# Patient Record
Sex: Male | Born: 1935 | Race: White | Hispanic: No | Smoking: Former smoker
Health system: Southern US, Community
[De-identification: ages and names within clinical notes are randomized; demographics above are authoritative.]

## PROBLEM LIST (undated history)

## (undated) DIAGNOSIS — E785 Hyperlipidemia, unspecified: Secondary | ICD-10-CM

## (undated) DIAGNOSIS — N138 Other obstructive and reflux uropathy: Secondary | ICD-10-CM

## (undated) DIAGNOSIS — I25810 Atherosclerosis of coronary artery bypass graft(s) without angina pectoris: Secondary | ICD-10-CM

## (undated) DIAGNOSIS — J189 Pneumonia, unspecified organism: Secondary | ICD-10-CM

## (undated) DIAGNOSIS — N401 Enlarged prostate with lower urinary tract symptoms: Secondary | ICD-10-CM

## (undated) DIAGNOSIS — I739 Peripheral vascular disease, unspecified: Secondary | ICD-10-CM

## (undated) DIAGNOSIS — K219 Gastro-esophageal reflux disease without esophagitis: Secondary | ICD-10-CM

## (undated) DIAGNOSIS — I779 Disorder of arteries and arterioles, unspecified: Secondary | ICD-10-CM

## (undated) DIAGNOSIS — I10 Essential (primary) hypertension: Secondary | ICD-10-CM

## (undated) DIAGNOSIS — L111 Transient acantholytic dermatosis [Grover]: Secondary | ICD-10-CM

## (undated) HISTORY — DX: Disorder of arteries and arterioles, unspecified: I77.9

## (undated) HISTORY — DX: Transient acantholytic dermatosis (grover): L11.1

## (undated) HISTORY — DX: Benign prostatic hyperplasia with lower urinary tract symptoms: N40.1

## (undated) HISTORY — DX: Gastro-esophageal reflux disease without esophagitis: K21.9

## (undated) HISTORY — DX: Essential (primary) hypertension: I10

## (undated) HISTORY — PX: POLYPECTOMY: SHX149

## (undated) HISTORY — DX: Hyperlipidemia, unspecified: E78.5

## (undated) HISTORY — PX: COLONOSCOPY: SHX174

## (undated) HISTORY — DX: Atherosclerosis of coronary artery bypass graft(s) without angina pectoris: I25.810

## (undated) HISTORY — DX: Pneumonia, unspecified organism: J18.9

## (undated) HISTORY — DX: Other obstructive and reflux uropathy: N13.8

## (undated) HISTORY — PX: OTHER SURGICAL HISTORY: SHX169

## (undated) HISTORY — DX: Peripheral vascular disease, unspecified: I73.9

---

## 1992-08-07 ENCOUNTER — Encounter: Payer: Self-pay | Admitting: Cardiology

## 1993-10-18 HISTORY — PX: CORONARY ARTERY BYPASS GRAFT: SHX141

## 2002-06-24 ENCOUNTER — Inpatient Hospital Stay (HOSPITAL_COMMUNITY): Admission: EM | Admit: 2002-06-24 | Discharge: 2002-06-25 | Payer: Self-pay | Admitting: Emergency Medicine

## 2002-06-25 ENCOUNTER — Encounter: Payer: Self-pay | Admitting: Internal Medicine

## 2002-10-18 HISTORY — PX: OTHER SURGICAL HISTORY: SHX169

## 2003-01-08 ENCOUNTER — Encounter: Payer: Self-pay | Admitting: Internal Medicine

## 2003-01-08 ENCOUNTER — Inpatient Hospital Stay (HOSPITAL_COMMUNITY): Admission: EM | Admit: 2003-01-08 | Discharge: 2003-01-11 | Payer: Self-pay | Admitting: Internal Medicine

## 2003-01-09 ENCOUNTER — Encounter: Payer: Self-pay | Admitting: Internal Medicine

## 2003-01-10 ENCOUNTER — Encounter: Payer: Self-pay | Admitting: Internal Medicine

## 2004-09-04 ENCOUNTER — Ambulatory Visit: Payer: Self-pay | Admitting: Internal Medicine

## 2004-12-09 ENCOUNTER — Ambulatory Visit: Payer: Self-pay | Admitting: Cardiology

## 2005-09-08 ENCOUNTER — Ambulatory Visit: Payer: Self-pay | Admitting: Internal Medicine

## 2005-12-23 ENCOUNTER — Ambulatory Visit: Payer: Self-pay | Admitting: Cardiology

## 2005-12-29 ENCOUNTER — Ambulatory Visit: Payer: Self-pay | Admitting: Cardiology

## 2006-01-17 ENCOUNTER — Ambulatory Visit: Payer: Self-pay

## 2006-08-11 ENCOUNTER — Ambulatory Visit: Payer: Self-pay | Admitting: Internal Medicine

## 2006-12-19 ENCOUNTER — Ambulatory Visit: Payer: Self-pay | Admitting: Cardiology

## 2006-12-19 LAB — CONVERTED CEMR LAB
ALT: 35 units/L (ref 0–40)
AST: 33 units/L (ref 0–37)
Albumin: 3.7 g/dL (ref 3.5–5.2)
Alkaline Phosphatase: 47 units/L (ref 39–117)
BUN: 15 mg/dL (ref 6–23)
Bilirubin, Direct: 0.2 mg/dL (ref 0.0–0.3)
CO2: 30 meq/L (ref 19–32)
Calcium: 8.9 mg/dL (ref 8.4–10.5)
Chloride: 104 meq/L (ref 96–112)
Cholesterol: 121 mg/dL (ref 0–200)
Creatinine, Ser: 1.3 mg/dL (ref 0.4–1.5)
GFR calc Af Amer: 70 mL/min
GFR calc non Af Amer: 58 mL/min
Glucose, Bld: 96 mg/dL (ref 70–99)
HDL: 48.9 mg/dL (ref >39.0)
LDL Cholesterol: 56 mg/dL (ref 0–99)
Potassium: 3.9 meq/L (ref 3.5–5.1)
Sodium: 139 meq/L (ref 135–145)
Total Bilirubin: 1.1 mg/dL (ref 0.3–1.2)
Total CHOL/HDL Ratio: 2.5
Total Protein: 6.6 g/dL (ref 6.0–8.3)
Triglycerides: 80 mg/dL (ref 0–149)
VLDL: 16 mg/dL (ref 0–40)

## 2007-05-03 ENCOUNTER — Ambulatory Visit: Payer: Self-pay

## 2007-05-03 ENCOUNTER — Encounter: Payer: Self-pay | Admitting: Internal Medicine

## 2007-09-07 ENCOUNTER — Ambulatory Visit: Payer: Self-pay | Admitting: Internal Medicine

## 2007-10-02 ENCOUNTER — Ambulatory Visit: Payer: Self-pay | Admitting: Cardiology

## 2007-10-02 LAB — CONVERTED CEMR LAB
ALT: 33 units/L (ref 0–53)
AST: 30 units/L (ref 0–37)
Albumin: 3.6 g/dL (ref 3.5–5.2)
Alkaline Phosphatase: 39 units/L (ref 39–117)
Bilirubin, Direct: 0.2 mg/dL (ref 0.0–0.3)
Cholesterol: 119 mg/dL (ref 0–200)
HDL: 44.6 mg/dL (ref >39.0)
LDL Cholesterol: 61 mg/dL (ref 0–99)
Total Bilirubin: 1.3 mg/dL — ABNORMAL HIGH (ref 0.3–1.2)
Total CHOL/HDL Ratio: 2.7
Total Protein: 6.8 g/dL (ref 6.0–8.3)
Triglycerides: 66 mg/dL (ref 0–149)
VLDL: 13 mg/dL (ref 0–40)

## 2007-11-10 ENCOUNTER — Ambulatory Visit: Payer: Self-pay | Admitting: Internal Medicine

## 2008-01-01 ENCOUNTER — Ambulatory Visit: Payer: Self-pay | Admitting: Cardiology

## 2008-03-01 ENCOUNTER — Ambulatory Visit: Payer: Self-pay | Admitting: Internal Medicine

## 2008-05-17 ENCOUNTER — Ambulatory Visit: Payer: Self-pay

## 2008-07-24 ENCOUNTER — Ambulatory Visit: Payer: Self-pay | Admitting: Internal Medicine

## 2008-12-25 ENCOUNTER — Encounter: Payer: Self-pay | Admitting: Cardiology

## 2008-12-25 ENCOUNTER — Ambulatory Visit: Payer: Self-pay | Admitting: Cardiology

## 2009-03-03 ENCOUNTER — Encounter (INDEPENDENT_AMBULATORY_CARE_PROVIDER_SITE_OTHER): Payer: Self-pay | Admitting: *Deleted

## 2009-04-17 ENCOUNTER — Telehealth (INDEPENDENT_AMBULATORY_CARE_PROVIDER_SITE_OTHER): Payer: Self-pay | Admitting: *Deleted

## 2009-04-22 ENCOUNTER — Ambulatory Visit: Payer: Self-pay

## 2009-04-22 ENCOUNTER — Ambulatory Visit: Payer: Self-pay | Admitting: Internal Medicine

## 2009-04-22 ENCOUNTER — Encounter: Payer: Self-pay | Admitting: Cardiology

## 2009-04-22 DIAGNOSIS — N401 Enlarged prostate with lower urinary tract symptoms: Secondary | ICD-10-CM

## 2009-04-22 DIAGNOSIS — L259 Unspecified contact dermatitis, unspecified cause: Secondary | ICD-10-CM | POA: Insufficient documentation

## 2009-04-22 LAB — CONVERTED CEMR LAB
Blood in Urine, dipstick: NEGATIVE
Nitrite: NEGATIVE
Protein, U semiquant: NEGATIVE
WBC Urine, dipstick: NEGATIVE

## 2009-04-23 ENCOUNTER — Telehealth (INDEPENDENT_AMBULATORY_CARE_PROVIDER_SITE_OTHER): Payer: Self-pay | Admitting: *Deleted

## 2009-04-24 ENCOUNTER — Ambulatory Visit: Payer: Self-pay | Admitting: Internal Medicine

## 2009-04-24 DIAGNOSIS — E7800 Pure hypercholesterolemia, unspecified: Secondary | ICD-10-CM | POA: Insufficient documentation

## 2009-04-24 DIAGNOSIS — E785 Hyperlipidemia, unspecified: Secondary | ICD-10-CM

## 2009-04-29 ENCOUNTER — Ambulatory Visit: Payer: Self-pay | Admitting: Cardiology

## 2009-04-29 ENCOUNTER — Encounter (INDEPENDENT_AMBULATORY_CARE_PROVIDER_SITE_OTHER): Payer: Self-pay | Admitting: *Deleted

## 2009-04-29 ENCOUNTER — Encounter: Payer: Self-pay | Admitting: Cardiology

## 2009-04-29 DIAGNOSIS — I25810 Atherosclerosis of coronary artery bypass graft(s) without angina pectoris: Secondary | ICD-10-CM | POA: Insufficient documentation

## 2009-04-29 DIAGNOSIS — R9439 Abnormal result of other cardiovascular function study: Secondary | ICD-10-CM | POA: Insufficient documentation

## 2009-04-29 DIAGNOSIS — I10 Essential (primary) hypertension: Secondary | ICD-10-CM | POA: Insufficient documentation

## 2009-04-30 ENCOUNTER — Encounter: Payer: Self-pay | Admitting: Cardiology

## 2009-04-30 LAB — CONVERTED CEMR LAB
Basophils Relative: 0.7 % (ref 0.0–3.0)
Chloride: 104 meq/L (ref 96–112)
Creatinine, Ser: 1.1 mg/dL (ref 0.4–1.5)
Eosinophils Relative: 4.6 % (ref 0.0–5.0)
INR: 1 (ref 0.8–1.0)
MCV: 92.1 fL (ref 78.0–100.0)
Monocytes Absolute: 0.9 10*3/uL (ref 0.1–1.0)
Neutrophils Relative %: 62.4 % (ref 43.0–77.0)
Potassium: 4.1 meq/L (ref 3.5–5.1)
RBC: 4.49 M/uL (ref 4.22–5.81)
WBC: 8.7 10*3/uL (ref 4.5–10.5)

## 2009-05-01 ENCOUNTER — Inpatient Hospital Stay (HOSPITAL_BASED_OUTPATIENT_CLINIC_OR_DEPARTMENT_OTHER): Admission: RE | Admit: 2009-05-01 | Discharge: 2009-05-01 | Payer: Self-pay | Admitting: Cardiology

## 2009-05-01 ENCOUNTER — Ambulatory Visit: Payer: Self-pay | Admitting: Cardiology

## 2009-05-04 LAB — CONVERTED CEMR LAB
Albumin: 3.9 g/dL (ref 3.5–5.2)
HDL: 50.1 mg/dL (ref >39.00)
Hgb A1c MFr Bld: 6 % (ref 4.6–6.5)
LDL Cholesterol: 64 mg/dL (ref 0–99)
PSA: 0.43 ng/mL (ref 0.10–4.00)
Total CHOL/HDL Ratio: 3
VLDL: 13.2 mg/dL (ref 0.0–40.0)

## 2009-05-05 ENCOUNTER — Telehealth (INDEPENDENT_AMBULATORY_CARE_PROVIDER_SITE_OTHER): Payer: Self-pay | Admitting: *Deleted

## 2009-05-06 ENCOUNTER — Encounter (INDEPENDENT_AMBULATORY_CARE_PROVIDER_SITE_OTHER): Payer: Self-pay | Admitting: *Deleted

## 2009-05-15 ENCOUNTER — Ambulatory Visit: Payer: Self-pay | Admitting: Cardiology

## 2009-07-22 ENCOUNTER — Telehealth: Payer: Self-pay | Admitting: Cardiology

## 2009-07-23 ENCOUNTER — Ambulatory Visit: Payer: Self-pay | Admitting: Internal Medicine

## 2009-08-19 ENCOUNTER — Ambulatory Visit: Payer: Self-pay | Admitting: Cardiology

## 2009-09-04 ENCOUNTER — Telehealth: Payer: Self-pay | Admitting: Cardiology

## 2009-09-26 ENCOUNTER — Telehealth: Payer: Self-pay | Admitting: Cardiology

## 2009-10-06 ENCOUNTER — Telehealth: Payer: Self-pay | Admitting: Cardiology

## 2009-10-24 ENCOUNTER — Ambulatory Visit: Payer: Self-pay | Admitting: Internal Medicine

## 2009-10-27 LAB — CONVERTED CEMR LAB: Hgb A1c MFr Bld: 6.2 % (ref 4.6–6.5)

## 2009-11-03 ENCOUNTER — Telehealth: Payer: Self-pay | Admitting: Cardiology

## 2009-11-04 ENCOUNTER — Telehealth: Payer: Self-pay | Admitting: Cardiology

## 2009-11-10 ENCOUNTER — Telehealth: Payer: Self-pay | Admitting: Cardiology

## 2009-11-10 ENCOUNTER — Ambulatory Visit: Payer: Self-pay | Admitting: Internal Medicine

## 2009-11-10 DIAGNOSIS — R7309 Other abnormal glucose: Secondary | ICD-10-CM

## 2009-11-10 DIAGNOSIS — R7303 Prediabetes: Secondary | ICD-10-CM | POA: Insufficient documentation

## 2010-02-16 ENCOUNTER — Ambulatory Visit: Payer: Self-pay | Admitting: Cardiology

## 2010-04-30 ENCOUNTER — Telehealth (INDEPENDENT_AMBULATORY_CARE_PROVIDER_SITE_OTHER): Payer: Self-pay | Admitting: *Deleted

## 2010-06-01 ENCOUNTER — Ambulatory Visit: Payer: Self-pay | Admitting: Internal Medicine

## 2010-06-01 DIAGNOSIS — I8000 Phlebitis and thrombophlebitis of superficial vessels of unspecified lower extremity: Secondary | ICD-10-CM

## 2010-06-02 ENCOUNTER — Encounter: Payer: Self-pay | Admitting: Internal Medicine

## 2010-06-02 ENCOUNTER — Ambulatory Visit: Payer: Self-pay

## 2010-06-08 ENCOUNTER — Ambulatory Visit: Payer: Self-pay | Admitting: Internal Medicine

## 2010-06-09 LAB — CONVERTED CEMR LAB
Albumin: 4 g/dL (ref 3.5–5.2)
Alkaline Phosphatase: 40 units/L (ref 39–117)
BUN: 17 mg/dL (ref 6–23)
Cholesterol: 143 mg/dL (ref 0–200)
Creatinine, Ser: 1.1 mg/dL (ref 0.4–1.5)
HDL: 43.9 mg/dL (ref >39.00)
Potassium: 4.8 meq/L (ref 3.5–5.1)
Triglycerides: 62 mg/dL (ref 0.0–149.0)
VLDL: 12.4 mg/dL (ref 0.0–40.0)

## 2010-06-15 ENCOUNTER — Telehealth (INDEPENDENT_AMBULATORY_CARE_PROVIDER_SITE_OTHER): Payer: Self-pay | Admitting: *Deleted

## 2010-08-07 ENCOUNTER — Ambulatory Visit: Payer: Self-pay | Admitting: Internal Medicine

## 2010-09-08 ENCOUNTER — Telehealth: Payer: Self-pay | Admitting: Cardiology

## 2010-11-19 NOTE — Miscellaneous (Signed)
Summary: Orders Update  Clinical Lists Changes  Orders: Added new Test order of Venous Duplex Lower Extremity (Venous Duplex Lower) - Signed 

## 2010-11-19 NOTE — Progress Notes (Signed)
Summary: B/P READINGS FOR THREE WEEKS   Phone Note Call from Patient Call back at Home Phone [phone] Call back at Work Phone [phone]   Caller: Patient Summary of Call: b/p readings 10/15/09 to 11/03/09  120/80 and had 14 in the 130's and 14 in the 140's and 6 in the 150's and 2 in the 160"s took b/p two to three times a day Initial call taken by: Judie Grieve,  November 03, 2009 4:03 PM  Follow-up for Phone Call        Continue current meds. If average is 140 no changes.  Follow-up by: Gaylord Shih, MD, [facility],  November 04, 2009 5:48 PM

## 2010-11-19 NOTE — Assessment & Plan Note (Signed)
Summary: See me before refilling meds/kdc   Vital Signs:  Patient profile:   77 year old male Weight:      231 pounds Pulse rate:   72 / minute Resp:     17 per minute BP sitting:   130 / 70  (left arm) Cuff size:   large  Vitals Entered By: Shonna Chock (November 10, 2009 9:35 AM) CC: Follow-up visit: Discuss labs Comments REVIEWED MED LIST, PATIENT AGREED DOSE AND INSTRUCTION CORRECT    Primary Care Provider:  Dr. Alwyn Ren  CC:  Follow-up visit: Discuss labs.  History of Present Illness: Lab reviewed & risk discussed; A1c up from 6 % to 6.2%. No CVE or diet. Weight stable; not checking glucoses @ home.  Allergies: 1)  ! Sulfa  Review of Systems General:  Denies weight loss. Eyes:  Denies blurring, double vision, and vision loss-both eyes; Ophth exam < 12 months; no retinopathy. CV:  Complains of lightheadness; denies chest pain or discomfort, leg cramps with exertion, near fainting, swelling of feet, swelling of hands, and weight gain. MS:  Complains of muscle aches; Occa nocturnal cramps. Derm:  Denies poor wound healing. Neuro:  Denies numbness and tingling; No limb burning. Endo:  Denies excessive hunger, excessive thirst, and excessive urination.  Physical Exam  General:  well-nourished,in no acute distress; alert,appropriate and cooperative throughout examination Lungs:  Normal respiratory effort, chest expands symmetrically. Lungs are clear to auscultation, no crackles or wheezes. Heart:  normal rate, regular rhythm, no gallop, no rub, no JVD, and grade 1/2 /6 systolic murmur.   Pulses:  R and L carotid,radial,dorsalis pedis and posterior tibial pulses are full and equal bilaterally. R carotid umble > L Extremities:  No clubbing, cyanosis, edema. Mild toenail deformities Neurologic:  sensation intact to light touch over feet.   Skin:  Intact without suspicious lesions or rashes Psych:  memory intact for recent and remote, normally interactive, and good eye contact.        Impression & Recommendations:  Problem # 1:  HYPERTENSION, UNSPECIFIED (ICD-401.9) controlled His updated medication list for this problem includes:    Trandolapril 2 Mg Tabs (Trandolapril) .Marland Kitchen... 1 two times a day  Problem # 2:  DYSLIPIDEMIA (ICD-272.4) controlled His updated medication list for this problem includes:    Vytorin 10-20 Mg Tabs (Ezetimibe-simvastatin) .Marland Kitchen... Take one tablet by mouth daily at bedtime  Problem # 3:  FASTING HYPERGLYCEMIA (ICD-790.29) A1c climbing  Problem # 4:  MUSCLE PAIN (ICD-729.1) nocturnal, occasionally His updated medication list for this problem includes:    Aspirin 81 Mg Tbec (Aspirin) .Marland Kitchen... Take one tablet by mouth daily  Complete Medication List: 1)  Aspirin 81 Mg Tbec (Aspirin) .... Take one tablet by mouth daily 2)  Trandolapril 2 Mg Tabs (Trandolapril) .Marland Kitchen.. 1 two times a day 3)  Lumigan 0.03 % Soln (Bimatoprost) .... Use as directed 4)  Finasteride 5 Mg Tabs (Finasteride) .Marland Kitchen.. 1 once daily 5)  Doxycycline Hyclate 50 Mg Caps (Doxycycline hyclate) .Marland Kitchen.. 1 cap once daily 6)  Vytorin 10-20 Mg Tabs (Ezetimibe-simvastatin) .... Take one tablet by mouth daily at bedtime  Patient Instructions: 1)  Consume LESS THAN 40 grams of sugar/day from foods & drinks with High Fructose Corn Syrup as #1, 2 or #3 on label. Brown carbs in favor of white carbs. 2)  Please schedule a follow-up appointment in 6 months. 3)  BUN,creat, K+ prior to visit, ICD-9:401.9 4)  Hepatic Panel prior to visit, ICD-9:995.20 5)  Lipid Panel prior to  visit, ICD-9:272.4 6)  PSA prior to visit, ICD-9:600.9 7)  HbgA1C prior to visit, ICD-9:790.29 8)  Urine Microalbumin prior to visit, ICD-9:; vitamin D level : 729.1.  Mag/ Cal at bedtime as needed for cramps

## 2010-11-19 NOTE — Assessment & Plan Note (Signed)
Summary: 6 month f/u /cy  Medications Added TRANDOLAPRIL 2 MG  TABS (TRANDOLAPRIL) 2 tabs at bedtime SIMVASTATIN 40 MG TABS (SIMVASTATIN) 1 once daily        Visit Type:  6 mo f/u Primary Provider:  Dr. Alwyn Ren  CC:  fatigues easily...denies any other complaints today.  History of Present Illness: Mr Zwart returns today for evaluation and management of his coronary artery disease. His catheterization as reported below showed all 7 bypass grafts to be patent after 15 years. We've celebrated that again today.  He is having no angina or ischemic symptoms. He remains very active. He is having no symptoms of TIAs or mini strokes.  They would like to switch to generic simvastatin consider Vytorin.  Current Medications (verified): 1)  Aspirin 81 Mg Tbec (Aspirin) .... Take One Tablet By Mouth Daily 2)  Trandolapril 2 Mg  Tabs (Trandolapril) .... 2 Tabs At Bedtime 3)  Lumigan 0.03 % Soln (Bimatoprost) .... Use As Directed 4)  Finasteride 5 Mg Tabs (Finasteride) .Marland Kitchen.. 1 Once Daily 5)  Doxycycline Hyclate 50 Mg Caps (Doxycycline Hyclate) .Marland Kitchen.. 1 Cap Once Daily 6)  Vytorin 10-20 Mg Tabs (Ezetimibe-Simvastatin) .... Take One Tablet By Mouth Daily At Bedtime  Allergies: 1)  ! Sulfa  Past History:  Past Medical History: Last updated: 04/29/2009 CAD, ARTERY BYPASS GRAFT (ICD-414.04) CAROTID ARTERY DISEASE/NONOBSTRUCTIVE (ICD-433.10) HYPERTENSION, UNSPECIFIED (ICD-401.9) DYSLIPIDEMIA (ICD-272.4) HYPERTROPHY PROSTATE W/UR OBST & OTH LUTS (ICD-600.01) DERMATITIS (ICD-692.9) outpatient head injury   Past Surgical History: Last updated: 03/01/2008 acute indigestion CABG times 7 pneumonia as a child  Family History: Last updated: 04/29/2009 father cva Negative for myocardial infarction, gastrointestinal disease, cancer, diabetes.   Social History: Last updated: 04/29/2009  Former Smoker quit 1986 Alcohol Use - yes.Marland Kitchen2-3 drinks per week  Risk Factors: Smoking Status: quit  (03/01/2008)  Review of Systems       negative other than history of present illness  Vital Signs:  Patient profile:   77 year old male Height:      74 inches Weight:      226 pounds BMI:     29.12 Pulse rate:   68 / minute Pulse rhythm:   regular BP sitting:   112 / 70  (left arm) Cuff size:   large  Vitals Entered By: Danielle Rankin, CMA (Feb 16, 2010 10:16 AM)  Physical Exam  General:  Well developed, well nourished, in no acute distress. Head:  normocephalic and atraumatic Eyes:  PERRLA/EOM intact; conjunctiva and lids normal. Neck:  Neck supple, no JVD. No masses, thyromegaly or abnormal cervical nodes. Lungs:  Clear bilaterally to auscultation and percussion. Heart:  Non-displaced PMI, chest non-tender; regular rate and rhythm, S1, S2 without murmurs, rubs or gallops. Carotid upstroke normal, no bruit. Normal abdominal aortic size, no bruits. Femorals normal pulses, no bruits. Pedals normal pulses. No edema, no varicosities. Abdomen:  Bowel sounds positive; abdomen soft and non-tender without masses, organomegaly, or hernias noted. No hepatosplenomegaly. Msk:  Back normal, normal gait. Muscle strength and tone normal. Pulses:  pulses normal in all 4 extremities Extremities:  No clubbing or cyanosis. Neurologic:  Alert and oriented x 3. Skin:  Intact without lesions or rashes. Psych:  Normal affect.   Impression & Recommendations:  Problem # 1:  CAD, ARTERY BYPASS GRAFT (ICD-414.04) Assessment Unchanged  His updated medication list for this problem includes:    Aspirin 81 Mg Tbec (Aspirin) .Marland Kitchen... Take one tablet by mouth daily    Trandolapril 2 Mg Tabs (Trandolapril) .Marland KitchenMarland KitchenMarland KitchenMarland Kitchen  2 tabs at bedtime  Problem # 2:  CAROTID ARTERY DISEASE/NONOBSTRUCTIVE (ICD-433.10) Assessment: Unchanged  His updated medication list for this problem includes:    Aspirin 81 Mg Tbec (Aspirin) .Marland Kitchen... Take one tablet by mouth daily  Problem # 3:  HYPERTENSION, UNSPECIFIED (ICD-401.9) Assessment:  Unchanged  His updated medication list for this problem includes:    Aspirin 81 Mg Tbec (Aspirin) .Marland Kitchen... Take one tablet by mouth daily    Trandolapril 2 Mg Tabs (Trandolapril) .Marland Kitchen... 2 tabs at bedtime  Problem # 4:  DYSLIPIDEMIA (ICD-272.4) I have changed him to simvastatin 40 mg q.h.s. per his insurance/Medicare requirements. Hopefully his levels will remain at goal. I told him that there is no guarantee. We'll check labs in 6 weeks once he switches over. The following medications were removed from the medication list:    Vytorin 10-20 Mg Tabs (Ezetimibe-simvastatin) .Marland Kitchen... Take one tablet by mouth daily at bedtime His updated medication list for this problem includes:    Simvastatin 40 Mg Tabs (Simvastatin) .Marland Kitchen... 1 once daily  Clinical Reports Reviewed:  Cardiac Cath:  05/01/2009: Cardiac Cath Findings:   The estimated fraction was 65%.      HEMODYNAMIC DATA:  The aortic pressure was 100/59 with a mean of 77.   The left ventricular pressure was 100/10.      CONCLUSION:   1. Coronary artery disease status post coronary artery bypass graft       surgery in 1995.   2. Severe native vessel disease with total occlusion of the proximal       LAD, total occlusion of the mid circumflex artery, and total       occlusion of the proximal right coronary artery.   3. Patent sequential vein graft to the acute marginal and posterior       ascending branch of the right coronary artery with a small filling       defect in the mid-to-distal graft, patent sequential vein graft to       the marginal and posterolateral branch of the circumflex artery,       patent sequential vein graft to the first and second diagonal       branches of the LAD, and patent LIMA graft to the LAD.   4. Normal left ventricular function with an estimated fraction of 65%.      RECOMMENDATIONS:  All seven grafts are patent.  There does not appear to   be any source of ischemia.  We will plan continued secondary risk factor    modification.               Bruce Elvera Lennox Juanda Chance, MD, [facility]            BRB/MEDQ  D:  05/01/2009  T:  05/02/2009  Job:  [id number]      cc:   Liliann File C. Daleen Squibb, MD, [facility]   Titus Dubin. Alwyn Ren, MD,FACP,FCCP  Cardiac Cath Findings:   The left ventriculogram performed in the RAO projection showed good Tayler Lassen   motion with no areas of hypokinesis.  The estimated fraction was 65%.      HEMODYNAMIC DATA:  The aortic pressure was 100/59 with a mean of 77.   The left ventricular pressure was 100/10.      CONCLUSION:   1. Coronary artery disease status post coronary artery bypass graft       surgery in 1995.   2. Severe native vessel disease with total occlusion of the proximal  LAD, total occlusion of the mid circumflex artery, and total       occlusion of the proximal right coronary artery.   3. Patent sequential vein graft to the acute marginal and posterior       ascending branch of the right coronary artery with a small filling       defect in the mid-to-distal graft, patent sequential vein graft to       the marginal and posterolateral branch of the circumflex artery,       patent sequential vein graft to the first and second diagonal       branches of the LAD, and patent LIMA graft to the LAD.   4. Normal left ventricular function with an estimated fraction of 65%.      RECOMMENDATIONS:  All seven grafts are patent.  There does not appear to   be any source of ischemia.  We will plan continued secondary risk factor   modification.               Bruce Elvera Lennox Juanda Chance, MD, [facility]   Electronically Signed      09/10/1994: Cardiac Cath Findings:  EF 50% 3 vessel disease  Nuclear ETT:  05/03/2007: Nuclear ETT Findings:  Good exercise tolerance EF 65% No ischemia or infarct   Patient Instructions: 1)  Your physician recommends that you schedule a follow-up appointment in: YEAR WITH DR Bartosz Luginbill 2)  Your physician recommends that you return for lab work in: 6 WEEKS AROUND JUNE 13 LIPID LIVER  FASTING 272.4 V58.69 3)  Your physician has recommended you make the following change in your medication: STOP VYTORIN AND START SIMVASTATIN  40 MG EVERY DAY Prescriptions: SIMVASTATIN 40 MG TABS (SIMVASTATIN) 1 once daily  #30 x 11   Entered by:   Scherrie Bateman, LPN   Authorized by:   Gaylord Shih, MD, [facility]   Signed by:   Scherrie Bateman, LPN on 87/56/4332   Method used:   [facility]  [address] [id number]       [address]       [place], [place]  [address]       Ph: [phone] or [phone]       Fax: [phone]   RxID:   [id number]

## 2010-11-19 NOTE — Progress Notes (Signed)
Summary: req call back   Phone Note Call from Patient Call back at Home Phone [phone] Call back at Work Phone [phone]   Caller: Spouse Reason for Call: Talk to Nurse Summary of Call: request call back about bp readings, very upset that no one has called her back Initial call taken by: Migdalia Dk,  November 10, 2009 1:22 PM  Follow-up for Phone Call        Spoke with pt's wife. She states being upset because this is the second time  pt's B/P reading have  been send to office. Pt. and wife not getting a response. Wife states she had to call to find out about MD's recomendations. Pt's wife would like for some one to know about this. B/P reading MD's  recomendatins given to wife. Ollen Gross, RN, BSN  November 10, 2009 2:13 PM     Prescriptions: TRANDOLAPRIL 2 MG  TABS (TRANDOLAPRIL) 1 two times a day  #90 x 3   Entered by:   Scherrie Bateman, LPN   Authorized by:   Gaylord Shih, MD, [facility]   Signed by:   Scherrie Bateman, LPN on 65/78/4696   Method used:   [facility]        [facility]  [address] [id number]       [address]       [place], [place]  [address]       Ph: [phone] or [phone]       Fax: [phone]   RxID:   [id number]   Appended Document: req call back PT AWARE TO CONT TO KEEP B/P LOG AND TO NOTIFY OFF WITH  READINGS WIFE AWARE NOT CHANGE IN MEDS AT THIS TIME .Zack Seal

## 2010-11-19 NOTE — Progress Notes (Signed)
Summary: rx trandolipril   Phone Note Refill Request Call back at Home Phone [phone] Message from:  Patient on November 04, 2009 8:23 AM  Refills Requested: Medication #1:  TRANDOLAPRIL 2 MG  TABS 1 two times a day [facility] [address] [facility] [place] [phone].    Method Requested: Fax to Local Pharmacy Initial call taken by: Lorne Skeens,  November 04, 2009 8:23 AM    Prescriptions: TRANDOLAPRIL 2 MG  TABS (TRANDOLAPRIL) 1 two times a day  #60 x 11   Entered by:   Danielle Rankin, CMA   Authorized by:   Gaylord Shih, MD, [facility]   Signed by:   Danielle Rankin, CMA on 11/04/2009   Method used:   [facility]        [facility]  [address] [id number]       [address]       [place], [place]  [address]       Ph: [phone] or [phone]       Fax: [phone]   RxID:   [id number]

## 2010-11-19 NOTE — Progress Notes (Signed)
Summary: refill  Phone Note Refill Request Message from:  Patient wife  Refills Requested: Medication #1:  FINASTERIDE 5 MG TABS 1 once daily   Dosage confirmed as above?Dosage Confirmed   Supply Requested: 3 months  Medication #2:  SIMVASTATIN 40 MG TABS 1 once daily.   Dosage confirmed as above?Dosage Confirmed   Supply Requested: 3 months pt wife states [facility] [address] [facility].............Marland KitchenFelecia Deloach CMA  June 15, 2010 12:11 PM   Initial call taken by: Jeremy Johann CMA,  June 15, 2010 12:10 PM  Follow-up for Phone Call        I called patient's wife to clarify pharmacy[facility] [address] Follow-up by: Shonna Chock CMA,  June 15, 2010 12:38 PM    Prescriptions: SIMVASTATIN 40 MG TABS (SIMVASTATIN) 1 once daily  #90 x 3   Entered by:   Shonna Chock CMA   Authorized by:   Marga Melnick MD   Signed by:   Shonna Chock CMA on 06/15/2010   Method used:   [facility]        [facility]  [address] [id number]       [address]       [place], [place]  [address]       Ph: [phone] or [phone]       Fax: [phone]   RxID:   [id number] FINASTERIDE 5 MG TABS (FINASTERIDE) 1 once daily  #90 x 3   Entered by:   Shonna Chock CMA   Authorized by:   Marga Melnick MD   Signed by:   Shonna Chock CMA on 06/15/2010   Method used:   [facility]        [facility]  [address] [id number]       [address]       [place], [place]  [address]       Ph: [phone] or [phone]       Fax: [phone]   RxID:   [id number]

## 2010-11-19 NOTE — Progress Notes (Signed)
   Phone Note Outgoing Call   Call placed by: Scherrie Bateman, LPN,  April 30, 2010 9:40 AM Call placed to: Patient Summary of Call: LEFT MESSAGEF RO PT TO CALL BACK IS DUE FOR LIPID LIVER . Initial call taken by: Scherrie Bateman, LPN,  April 30, 2010 9:40 AM  Follow-up for Phone Call        SPOKE WITH PT'S WIFE PT DID NOT START SIMVASTATIN UNTIL 2 WEEKS  AGO WILL COME IN END OF AUGUST FOR LIPID LIVER. Follow-up by: Scherrie Bateman, LPN,  April 30, 2010 10:03 AM

## 2010-11-19 NOTE — Assessment & Plan Note (Signed)
Summary: FLU//PH   Nurse Visit  CC: Flu shot./kb   Allergies: 1)  ! Sulfa  Orders Added: 1)  Flu Vaccine 50yrs + MEDICARE PATIENTS [Q2039] 2)  Administration Flu vaccine - MCR [G0008]          Flu Vaccine Consent Questions     Do you have a history of severe allergic reactions to this vaccine? no    Any prior history of allergic reactions to egg and/or gelatin? no    Do you have a sensitivity to the preservative Thimersol? no    Do you have a past history of Guillan-Barre Syndrome? no    Do you currently have an acute febrile illness? no    Have you ever had a severe reaction to latex? no    Vaccine information given and explained to patient? yes    Are you currently pregnant? no    Lot Number:[id number]   Exp Date:04/17/2011   Site Given  Left Deltoid IM

## 2010-11-19 NOTE — Assessment & Plan Note (Signed)
Summary: knot on leg/cbs/rescd/cbs   Vital Signs:  Patient profile:   77 year old male Weight:      228.2 pounds Temp:     98.6 degrees F oral Pulse rate:   64 / minute Resp:     15 per minute BP sitting:   122 / 68  (left arm) Cuff size:   large  Vitals Entered By: Shonna Chock CMA (June 01, 2010 12:29 PM) CC: 1.) Knot(s) on left lower leg x 1 month, discomfort with touch   2.) Order for fasting labs and PSA   Primary Care Provider:  Dr. Alwyn Ren  CC:  1.) Knot(s) on left lower leg x 1 month and discomfort with touch   2.) Order for fasting labs and PSA.  History of Present Illness: He questioned that he may  have  "bruised" his LLE   4-6 weeks w/o definite trauma; he noted tenderness with 3 knots.He is only on 81 mg ASA once daily. No PMH of DVT or PTE. No FH of coagulopathy .                                                                                                                                                     Marland KitchenHe is due for lipid recheck & PSA monitor due to Finasteride. The patient denies muscle aches, GI upset, abdominal pain, flushing, itching, constipation, diarrhea, and fatigue.  The patient denies the following symptoms: chest pain/pressure, exercise intolerance, dypsnea, palpitations, and syncope.  Compliance with medications (by patient report) has been near 100%.  Dietary compliance has been good.  The patient reports no exercise.    Current Medications (verified): 1)  Aspirin 81 Mg Tbec (Aspirin) .... Take One Tablet By Mouth Daily 2)  Trandolapril 2 Mg  Tabs (Trandolapril) .... 2 Tabs At Bedtime 3)  Lumigan 0.03 % Soln (Bimatoprost) .... Use As Directed 4)  Finasteride 5 Mg Tabs (Finasteride) .Marland Kitchen.. 1 Once Daily 5)  Doxycycline Hyclate 50 Mg Caps (Doxycycline Hyclate) .Marland Kitchen.. 1 Cap Once Daily 6)  Simvastatin 40 Mg Tabs (Simvastatin) .Marland Kitchen.. 1 Once Daily  Allergies: 1)  ! Sulfa  Review of Systems CV:  Denies chest pain or discomfort, difficulty breathing at night,  difficulty breathing while lying down, swelling of feet, and swelling of hands. Resp:  Denies chest pain with inspiration, coughing up blood, sputum productive, and wheezing. GU:  Denies discharge, dysuria, hematuria, incontinence, and urinary hesitancy; Nocturia X 2.  Physical Exam  General:  in no acute distress; alert,appropriate and cooperative throughout examination Lungs:  Normal respiratory effort, chest expands symmetrically. Lungs are clear to auscultation, no crackles or wheezes. Heart:  regular rhythm, no murmur, no gallop, no rub, no JVD, no HJR, and bradycardia.   Split S1 Abdomen:  Bowel sounds positive,abdomen soft and non-tender without masses, organomegaly or hernias noted. Pulses:  R and L carotid,radial,dorsalis pedis and posterior tibial pulses are full and equal bilaterally Extremities:  No clubbing, cyanosis, edema. ? superficial thrombophlebitis LLE. Negative Homan's LLE. V V R calf Neurologic:  alert & oriented X3.   Skin:  Intact without suspicious lesions or rashes Psych:  memory intact for recent and remote, normally interactive, and good eye contact.     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 1 once daily  Problem # 3:  HYPERTENSION, UNSPECIFIED (ICD-401.9) controlled His updated medication list for this problem includes:    Trandolapril 2 Mg Tabs (Trandolapril) .Marland Kitchen... 2 tabs at bedtime  Problem # 4:  HYPERTROPHY PROSTATE W/UR OBST & OTH LUTS (ICD-600.01)  His updated medication list for this problem includes:    Finasteride 5 Mg Tabs (Finasteride) .Marland Kitchen... 1 once daily  Complete Medication List: 1)  Aspirin 81 Mg Tbec (Aspirin) .... Take one tablet by mouth daily 2)  Trandolapril 2 Mg Tabs (Trandolapril) .... 2  tabs at bedtime 3)  Lumigan 0.03 % Soln (Bimatoprost) .... Use as directed 4)  Finasteride 5 Mg Tabs (Finasteride) .Marland Kitchen.. 1 once daily 5)  Doxycycline Hyclate 50 Mg Caps (Doxycycline hyclate) .Marland Kitchen.. 1 cap once daily 6)  Simvastatin 40 Mg Tabs (Simvastatin) .Marland Kitchen.. 1 once daily  Patient Instructions: 1)   Increase ASA to 325 mg coated pill daily.Schedule fasting labs: 2)  BUN,creat , K+; 3)  Hepatic Panel ; 4)  Lipid Panel ; 5)  PSA ; 6)  HbgA1C .

## 2010-11-19 NOTE — Progress Notes (Signed)
Summary: refill request  Medications Added TRANDOLAPRIL 4 MG TABS (TRANDOLAPRIL) 1 tab at bedtime       Phone Note Refill Request Message from:  Patient on September 08, 2010 8:58 AM  Refills Requested: Medication #1:  TRANDOLAPRIL 2 MG  TABS 2 tabs at bedtime pt requesting 4 mg tab so doesn't have to take two 2mg  tabs and wants a 90 day supply   Method Requested: Telephone to Pharmacy Initial call taken by: Glynda Jaeger,  September 08, 2010 8:59 AM    New/Updated Medications: TRANDOLAPRIL 4 MG TABS (TRANDOLAPRIL) 1 tab at bedtime Prescriptions: TRANDOLAPRIL 4 MG TABS (TRANDOLAPRIL) 1 tab at bedtime  #90 x 3   Entered by:   Danielle Rankin, CMA   Authorized by:   Gaylord Shih, MD, [facility]   Signed by:   Danielle Rankin, CMA on 09/08/2010   Method used:   [facility]  [address] [id number]       [address]       [place], [place]  [address]       Ph: [phone] or [phone]       Fax: [phone]   RxID:   [id number]

## 2011-03-02 NOTE — Cardiovascular Report (Signed)
NAME:  Fernando Walker, Fernando Walker            ACCOUNT NO.:  [id number]   MEDICAL RECORD NO.:  [id number]          PATIENT TYPE:  OIB   LOCATION:  1966                         FACILITY:  [facility]   PHYSICIAN:  Everardo Beals. Juanda Chance, MD, FACCDATE OF BIRTH:  May 12, 1933   DATE OF PROCEDURE:  05/01/2009  DATE OF DISCHARGE:  05/01/2009                            CARDIAC CATHETERIZATION   CLINICAL HISTORY:  Mr. Basilio is 77 years old and had bypass surgery  by Dr. Laneta Simmers in 1995.  He recently had an abnormal Myoview scan, which  suggested inferior ischemia and was seen by Dr. Daleen Squibb and arrangements  were made for evaluation with angiography.  He has had no chest pain.   PROCEDURE:  The procedure was performed via the right femoral artery,  arterial sheath, and 4-French preformed coronary catheters.  A front  wall arterial puncture was performed, and Omnipaque contrast was used.  A 3D right catheter was used for injection of the LIMA graft.  AL-1  catheters were used for injection of the circumflex graft to the  diagonal grafts.  The patient tolerated the procedure well and left the  laboratory in satisfactory condition.   RESULTS:  The left main coronary artery was free of significant disease.   The left anterior descending artery was completely occluded after 2  septal perforators in its proximal portion.   The circumflex artery was completely occluded in its midportion.  There  is a 90% proximal stenosis.  The distal vessel filled faintly via  bridging collaterals.   The right coronary artery was completely occluded near its origin.   The saphenous vein graft to the acute marginal and posterior ascending  branch of the right coronary artery were patent.  There was a filling  defect in the mid-to-distal portion of the graft, which did not appear  to be obstructive.  There was also a 30-40% narrowing in midportion of  the graft just after the attachment to the acute marginal branch.   The LIMA graft  to LAD was patent and functioned normally.   The sequential graft to the marginal and posterolateral branch of  circumflex artery was patent and functioned normally.  There were  minimal luminal irregularities in the graft.   The vein graft to the first and second diagonal branch of the LAD was  patent and functioned normally.   The left ventriculogram performed in the RAO projection showed good wall  motion with no areas of hypokinesis.  The estimated fraction was 65%.   HEMODYNAMIC DATA:  The aortic pressure was 100/59 with a mean of 77.  The left ventricular pressure was 100/10.   CONCLUSION:  1. Coronary artery disease status post coronary artery bypass graft      surgery in 1995.  2. Severe native vessel disease with total occlusion of the proximal      LAD, total occlusion of the mid circumflex artery, and total      occlusion of the proximal right coronary artery.  3. Patent sequential vein graft to the acute marginal and posterior      ascending branch of the right coronary artery with  a small filling      defect in the mid-to-distal graft, patent sequential vein graft to      the marginal and posterolateral branch of the circumflex artery,      patent sequential vein graft to the first and second diagonal      branches of the LAD, and patent LIMA graft to the LAD.  4. Normal left ventricular function with an estimated fraction of 65%.   RECOMMENDATIONS:  All seven grafts are patent.  There does not appear to  be any source of ischemia.  We will plan continued secondary risk factor  modification.      Bruce Elvera Lennox Juanda Chance, MD, [facility]  Electronically Signed     BRB/MEDQ  D:  05/01/2009  T:  05/02/2009  Job:  [id number]   cc:   Thomas C. Daleen Squibb, MD, [facility]  Titus Dubin. Alwyn Ren, MD,FACP,FCCP

## 2011-03-02 NOTE — Assessment & Plan Note (Signed)
[facility]                            CARDIOLOGY OFFICE NOTE   BENJIMIN, HADDEN                   MRN:          [id number]  DATE:01/01/2008                            DOB:          07/14/1933    Mr. Duffey returns today for followup.   PROBLEM LIST:  1. Coronary artery disease status post coronary bypass grafting in      1995.  His last stress Myoview dated May 03, 2007 went 7 minutes      51 seconds.  Peak heart rate of 144 which is 97% of predicted      maximum heart rate.  There are no EKG changes.  He had an EF of 65%      with diaphragmatic attenuation, but no ischemia or infarct.  He had      normal contractility and thickening in all areas of the myocardium.  2. Hyperlipidemia.  He is on excellent control with Vytorin 10/40.  He      will be due lipids in December.  His LDL was last 61, HDL 44.  3. Hypertension under good control.  4. Nonobstructive carotid disease.  His last carotid Dopplers were      May 03, 2007, which showed nonobstructive plaque and antegrade      flow in both vertebrals.   He denies any orthopnea, PND, peripheral edema, angina, ischemic  equivalents.  He remains very active.  He seems very compliant.  He does  not smoke.   MEDICINES:  1. Aspirin 81 mg a day.  2. Xalatan eye drops.  3. Trandolapril 2 mg a day.  4. Vytorin 10/40 one-half tablet nightly.   PHYSICAL EXAMINATION:  His blood pressure is 130/67.  His heart rate 65  and regular.  His EKG is normal except for some nonspecific changes  which are stable.  His weight is 229, up two.  HEENT:  Normocephalic, atraumatic.  PERRL.  Extraocular movements are  intact.  Sclerae are clear.  Face symmetry is normal.  He has a  significant nasal ridge from previous allergies.  Dentition is  satisfactory.  NECK:  Supple.  Carotid upstrokes were equal bilaterally soft bruits  bilaterally.  Thyroid is not enlarged.  Trachea is midline.  LUNGS:  Clear.  HEART:  Reveals a poorly appreciated PMI.  He has a normal S1-S2 without  murmur, rub, or gallop.  ABDOMINAL EXAM:  Soft, good bowel sounds.  No midline bruit.  No  hepatomegaly.  EXTREMITIES:  No cyanosis, clubbing, or edema.  He has some venous  varicosities.  There is no edema.  There is no sign of DVT.  NEUROLOGIC:  Exam is stable.  SKIN:  Unremarkable.   I had a long talk with Mr. Altland and his wife.  He is doing  remarkably well.  A lot of this is due to his compliance, and good blood  pressure control as well as lipid control.   Since he is asymptomatic, we will plan on seeing him back in a year.  At  that time we will obtain carotid Dopplers as well as exercise  rest/stress Myoview.  In addition, we will need to obtain lipids, LFTs,  as well as a Chem-7 in December 2009.     Thomas C. Daleen Squibb, MD, [facility]  Electronically Signed    TCW/MedQ  DD: 01/01/2008  DT: 01/02/2008  Job #: [id number]   cc:   Titus Dubin. Alwyn Ren, MD,FACP,FCCP

## 2011-03-05 NOTE — H&P (Signed)
NAME:  ROCKO, FESPERMAN                      ACCOUNT NO.:  [id number]   MEDICAL RECORD NO.:  [id number]                   PATIENT TYPE:  EMS   LOCATION:  MAJO                                 FACILITY:  [facility]   PHYSICIAN:  Arturo Morton. Riley Kill, M.D. [facility]         DATE OF BIRTH:  Jul 02, 1933   DATE OF ADMISSION:  DATE OF DISCHARGE:                                HISTORY & PHYSICAL   PHYSICIANS:  1. Primary care physician Dr. Nelva Nay.  2. Cardiologist Jesse Sans. Wall, M.D. [facility].   CHIEF COMPLAINT:  Chest pain.   HISTORY OF PRESENT ILLNESS:  The patient is a 77 year old white male with a  history of hyperlipidemia and bypass surgery x 7 in 1995. He is currently  followed by Dr. Daleen Squibb for his  heart disease. He reports no recent problems  until two weeks ago when he had a bout of substernal chest pain which he  attributed to indigestion. This was short lived, however, his wife was  concerned and wanted him to see a doctor. This morning he  awoke  at 2 a.m.  with severe, sharp upper abdominal and  chest pain. He described  indigestion, however, it was unrelieved by bicarb soda and changing  position. He states that it was finally worse  with lying down, however, he  had burping and shortness of breath with this pain, which he describes as  8/10 pain underneath his sternum. The pain lasted from approximately 2:30  a.m. until 5 a.m. and it partially waxed and waned until he came to the  emergency room and was given a GI cocktail and Demerol.  In the ER his  EKG  was not significant and I was called to evaluate the patient.   At this time the patient is pain free and doing very well with no recurrent  chest pain or other problems.  He states that he has been very active,  playing golf, etc, without recent symptoms until two weeks ago.   PAST MEDICAL HISTORY:  1. Coronary artery disease, CABG x 7 in 1995. His current anatomy is unknown     as his medical  records are on microfiche.  2.  Hyperlipidemia. He has had a recent lipid panel with Dr. Daleen Squibb.   SOCIAL HISTORY:  The patient reports that he quit smoking in 1985. He  has a  90 to 110 pack year history of smoking. He  is a retired Counsellor.   FAMILY HISTORY:  Noncontributory.   ALLERGIES:  SULFA CAUSES A RASH.   MEDICATIONS:  1. Zocor 40 mg q.h.s.  2. Prinivil 20 mg q.d.  3. Aspirin 81 mg q.d.  4. Zinc as directed.   PHYSICAL EXAMINATION:  VITAL SIGNS:  His blood pressure during the  examination was 120/70, heart rate 65, respirations 18, O2 saturation 94% on  2 liters. GENERAL:  The patient is alert and oriented  x 3. He is not  in  acute distress.  HEENT:  Oropharynx, oral cavity are clear.  NECK:  He has a right carotid bruits. Carotids are 2+, no lymphadenopathy,  no JVD.  CHEST:  Clear to auscultation bilaterally, no wheezes, rales or rhonchi.  CARDIAC:  Regular rate and rhythm, no murmurs, rubs, gallops, normal S1, S2,  no S3 or S4, no lifts or heaves.  ABDOMEN:  Soft, nontender, nondistended, no hepatosplenomegaly, no rebound  or guarding.  EXTREMITIES:  2+ pulses bilaterally, no edema, no cyanosis  or clubbing.  NEUROLOGIC:  Exam is intact.   LABORATORY DATA:  White blood count 9.3, hemoglobin 13.8, hematocrit 39.8,  MCV 89.8, platelet count 249. His differential is within normal limits. PT  12.4, INR 0.9, PTT 26. Sodium 140, potassium 3.7, chloride 106, glucose 128,  BUN 21. Amylase 67. CK 139, MB 5, index 3.6, troponin is 0.02.   An EKG shows normal sinus rhythm with a rate of 58, no ST or T-wave changes.  His chest x-ray is pending.   ASSESSMENT AND PLAN:  The patient is a 77 year old male with a history of  coronary artery disease and CABG, who will be admitted for rule out MI.  1. Rule out MI. We will follow serial enzymes and follow for any recurrent     angina. We will follow his EKG and telemetry monitoring. The patient's     chest pain does have some typical and  atypical features, however, we will     need to proceed to risk modification with a noninvasive nuclear     Cardiolite test in the morning. Therefore will keep him n.p.o. for this     with possible discharge tomorrow if the test is negative.  2. Hyperlipidemia. A lipid panel is pending.     Heloise Beecham, M.D. [facility]                Thomas D. Riley Kill, M.D. [facility]    DWM/MEDQ  D:  06/24/2002  T:  06/25/2002  Job:  [id number]

## 2011-03-05 NOTE — Discharge Summary (Signed)
NAME:  Fernando Walker, Fernando Walker                      ACCOUNT NO.:  [id number]   MEDICAL RECORD NO.:  [id number]                   PATIENT TYPE:  INP   LOCATION:  2035                                 FACILITY:  [facility]   PHYSICIAN:  Jesse Sans. Wall, M.D. [facility]            DATE OF BIRTH:  [birth year]   DATE OF ADMISSION:  06/24/2002  DATE OF DISCHARGE:  06/25/2002                                 DISCHARGE SUMMARY   REASON FOR ADMISSION:  Chest pain.   DISCHARGE DIAGNOSES:  1. Chest pain, etiology uncertain.  Evaluated this admission with a gaited     stress Cardiolite revealing an ejection fraction of 66% without evidence     of ischemia.  2. Coronary artery disease, status post coronary artery bypass graft in     1995.  3. Episodic epigastric discomfort, probable gastroesophageal reflux disease.  4. Hyperlipidemia.  5. Hyperglycemia.   HISTORY OF PRESENT ILLNESS:  This delightful 76 year old gentleman with  cardiac history as outlined above came to our attention on the day of  admission with complaints of substernal chest pain.  He had experienced two  episodes of this in two weeks.  On the day of admission he awoke with severe  sharp upper abdominal and chest pain that he felt was indigestion.  He took  a Bicarb without relief.  There was some associated irritation and mild  shortness of breath, but no nausea, vomiting, or diaphoresis.  The pain  lasted pretty consistently from 2:30 to 5 a.m. and it was rated 5 out of 10.  On admission to the ED, his pain was relieved by GI Cocktail and Demerol.  His EKG showed normal sinus rhythm at a rate of 58 without any acute ST or T  wave changes.  First set of enzymes were negative.  Given his history, it  was elected to admit him.   HOSPITAL COURSE:  The patient was admitted to telemetry and continued on his  home medicines.  He was scheduled for gaited Cardiolite the following day.  He experienced no further chest pain and eventually ruled out.   He performed  Bruce protocol to stage IV achieving his maximum heart rate in 9 minutes 30  seconds.  He experienced no chest pain or ST segment change during the  tests.  Results as outlined above.  He was seen by Dr. Ladona Ridgel on the day of  discharge and felt to be stable enough for discharge home.  A proton pump  inhibitor was started empirically.   PHYSICAL EXAMINATION:  GENERAL:  The patient denied chest pain or shortness  of breath.  Epigastric pain resolved.  VITAL SIGNS:  Blood pressure 120/80, pulse 53, respirations 16 to 20.  HEART:  Regular, bradycardia.  LUNGS:  Clear bilaterally.  EXTREMITIES:  No edema.   EKG showed sinus bradycardia without ischemic ST morphology.   LABORATORY DATA:  Discharge chemistry; sodium 140, potassium 3.7, chloride  106,  BUN 21, glucose slightly high at 128.  CK-MB and troponins negative x3.  Discharge hemogram; WBC 9.3, hemoglobin 13.8, hematocrit 39.8, and platelets  249.   DISPOSITION:  The patient is discharged to home in the care of his very  supportive wife on the following medications.   DISCHARGE MEDICATIONS:  1. Aspirin 325 one q.d.  2. Zocor 40 mg one at night.  3. Lisinopril 20 mg one q.d.  4. Protonix 40 mg one q.d., this is new.  5. Nitroglycerin 0.4 mg one sublingual q.5 minutes x3 p.r.n. chest pain. The     patient has not had this before.   ACTIVITY:  10 minutes of counseling is devoted to the benefits of daily  exercise by walking. The patient states he received no cardiac rehab or  nutrition counseling after his surgery, so really appreciates the advice.   DIET:  Heart healthy diet and this was discussed.   FOLLOW UP:  The patient will follow up, as scheduled, with Dr. Daleen Squibb in  November and knows to not hesitate to bring any chest discomfort to our  attention immediately and he agrees to do so.     Pennelope Bracken, P.A. [facility]                   Thomas C. Wall, M.D. [facility]    LK/MEDQ  D:  06/25/2002  T:  06/25/2002  Job:   [id number]   cc:   Nelva Nay, M.D.

## 2011-03-05 NOTE — Assessment & Plan Note (Signed)
[facility]                            CARDIOLOGY OFFICE NOTE   NAME:Fernando Walker, Fernando Walker                   MRN:          [id number]  DATE:12/19/2006                            DOB:          06-01-1933    Fernando Walker returns today for management of the following issues:  1. Coronary artery disease, status post coronary artery bypass      grafting in 1995.  Stress Myoview, April 2007, EF 62% with a prior      inferior infarct, no ischemia.  2. Hypertension, well controlled on Mavik.  3. Hyperlipidemia at goal in the past, currently needs blood work.  4. Nonobstructive carotid disease, asymptomatic.   Other than getting fatigued after walking 9 holes of golf, he has no  complaints.  He has gained about 4 pounds.  His wife is here and just  wants to make sure he is doing well.   MEDICATIONS:  1. Aspirin 81 mg a day.  2. FOLTX one a day.  3. Mavik 2 mg a day.  4. Vytorin 10/40 every day.  5. Xalatan drops in each eye.   PHYSICAL EXAMINATION:  VITAL SIGNS:  His blood pressure today is 114/72.  His pulse is 68 and regular.  His weight is 227.  GENERAL:  He is in no acute distress.  He is funny and quite humorous.  HEENT:  Normocephalic atraumatic.  PERRLA.  Extraocular movements  intact.  Sclerae are clear.  Facial symmetry is normal.  Dentition is  satisfactory.  NECK:  Supple.  Carotid upstrokes are equal bilaterally with soft  systolic sounds bilaterally.  There is no JVD.  Thyroid is not enlarged.  Trachea is midline.  LUNGS:  Clear.  HEART:  Reveals a regular rate and rhythm.  There is no gallop.  ABDOMEN:  Soft.  Good bowel sounds.  No midline bruit.  There is no  hepatomegaly or tenderness.  EXTREMITIES:  Reveal no clubbing, cyanosis, or edema.  Pulses are brisk.  NEUROLOGIC:  Intact.  SKIN:  Shows a few ecchymoses.   EKG shows a sinus rhythm with nonspecific ST segment changes.  There has  been no change.   ASSESSMENT/PLAN:  Mr.  Walker is doing well.  I have stopped his FOLTX  and renewed his Mavik in the form of trandolapril and Vytorin.  We have  put him in for a comprehensive  metabolic panel today and lipids.  We will see him back again in a year.  At that time, he will get a stress Myoview and carotids.     Thomas C. Daleen Squibb, MD, [facility]  Electronically Signed    TCW/MedQ  DD: 12/19/2006  DT: 12/19/2006  Job #: [id number]   cc:   Titus Dubin. Alwyn Ren, MD,FACP,FCCP

## 2011-03-05 NOTE — Discharge Summary (Signed)
NAME:  Fernando Walker, Fernando Walker                      ACCOUNT NO.:  [id number]   MEDICAL RECORD NO.:  [id number]                   PATIENT TYPE:  INP   LOCATION:  0446                                 [facility]ACILITY:  [facility]   PHYSICIAN:  Georgina Quint. Plotnikov, M.D. [facility]      DATE OF BIRTH:  [birth year]   DATE OF ADMISSION:  01/08/2003  DATE OF DISCHARGE:  01/11/2003                                 DISCHARGE SUMMARY   FINAL DIAGNOSES:  1. Severe abdominal pain of unknown etiology, resolved completely (possible     exposure to unknown toxin).  2. Coronary artery disease, status post bypass in 1995.  3. A 2.5 cm infrarenal abdominal aortic aneurysm.  4. Hyperlipidemia.  5. Hypokalemia, corrected.   DISCHARGE MEDICATIONS:  1. Bentyl 10 mg t.i.d. p.r.n. spasms.  2. Protonix 40 mg q.a.m. x1 month.  3. Resume previous medications.   DIET:  Low fat, low salt.   DISCHARGE INSTRUCTIONS:  1. Call if problems.  2. Wear latex gloves when working under the house.  3. Wash hands.   FOLLOWUP:  1. Follow up with Dr. Alwyn Ren in 7 to 10 days.  2. Dr. Russella Dar in three to four weeks.   HISTORY OF PRESENT ILLNESS:  The patient is a 77 year old white male who  presented with three days of severe diffuse abdominal pain, constant, with  the absence of nausea, vomiting, and diarrhea.  For the details, please  address to Dr. Frederik Pear history and physical.   HOSPITAL COURSE:  The patient was seen by gastroenterology, underwent  several tests.  His condition has improved, and on the day of discharge he  has no abdominal pain and feeling well.   PHYSICAL EXAMINATION:  VITAL SIGNS:  His temperature is 97.2, heart rate 57,  respirations 18, blood pressure 123/69, O2 saturation 93% on room air.  GENERAL:  He is in no acute distress.  HEENT:  Moist mucosa.  NECK:  Supple.  LUNGS:  Clear.  HEART:  Regular S1 and S2.  ABDOMEN:  Soft and nontender, no organomegaly, no masses.  EXTREMITIES:  No edema.  NEUROLOGIC:  He  is alert and oriented and cooperative.   LABORATORY DATA:  MRA showed no atherosclerotic changes in the mesenteric  vessels.  He had a 2.5 cm infrarenal abdominal aortic aneurysm.  On 01/08/03,  an acute abdominal series showed mild ileus, no definite obstruction or  perforation, mild left basilar atelectasis.  CT scan of the abdomen showed  scattered diverticuli without diverticulitis, no free air, no adenopathy.  Liver was unremarkable.  There were calcifications noted within the E-wave  vessels, no aneurysm.  EKG showed sinus bradycardia.  On 01/09/03, potassium  3.3, alkaline phosphatase 36, white count 10.1, hemoglobin 16.1, normal  differential.  Sedimentation rate 3.  Creatinine 1.1, glucose 102.  CK  normal.  Urinalysis normal.  Georgina Quint. Plotnikov, M.D. [facility]    AVP/MEDQ  D:  01/11/2003  T:  01/11/2003  Job:  [id number]   cc:   Titus Dubin. Alwyn Ren, M.D. [facility]   Malcolm T. Russella Dar, M.D. [facility]   Charlaine Dalton. Sherene Sires, M.D. [facility]

## 2011-03-05 NOTE — H&P (Signed)
NAME:  Fernando Walker, Fernando Walker                      ACCOUNT NO.:  [id number]   MEDICAL RECORD NO.:  [id number]                   PATIENT TYPE:  INP   LOCATION:  0446                                 FACILITY:  [facility]   PHYSICIAN:  Titus Dubin. Alwyn Ren, M.D. [facility]         DATE OF BIRTH:  1933-04-12   DATE OF ADMISSION:  01/08/2003  DATE OF DISCHARGE:                                HISTORY & PHYSICAL   HISTORY OF PRESENT ILLNESS:  The patient is a 77 year old white male with  known coronary artery disease admitted with abdominal pain.   His symptoms began January 06, 2003 while eating his evening meal, but ties in  a meal consistent with fried seafood.  He developed acute pain in the right  posterior thorax.  It migrated to the left upper quadrant.  By March 22 it  had been intermittent, but now was radiating to upper quadrant on the right.  The morning of the visit, March 23, he was having pain around the umbilicus  which was excruciating at times.  He experienced dry gagging without  vomitus the evening of March 22 four to five times.  He has chronic loose  stools but this is actually improved with the present illness.   PAST MEDICAL HISTORY:  1. Hospitalization in September 2003 for acute indigestion.  2. He has had bypass grafting x7 in 1995.  3. He was hospitalized for pneumonia as a child.  4. In high school he was treated as an outpatient with head injury.   FAMILY HISTORY:  Negative for myocardial infarction, gastrointestinal  disease, cancer, diabetes.  His father had a stroke.   SOCIAL HISTORY:  He quit smoking in 1986.  He drinks two to three times per  week.   MEDICATIONS:  1. Zocor 40 mg at bedtime.  2. Accupril 20 mg daily.  3. Zinc vitamin preparation.  4. Baby aspirin.   ALLERGIES:  He is intolerant to SULFA.   REVIEW OF SYSTEMS:  The remainder of the review of systems is negative.  Specifically, he denies any cardiopulmonary symptoms of chest pain, cough,  or shortness  of breath.  He has had no genitourinary symptoms either.  He  has had chills, but no fever and no rash.   PHYSICAL EXAMINATION:  GENERAL:  He appears mildly uncomfortable.  VITAL SIGNS:  Weight is 218 which is up approximately 7.5 pounds.  He is  afebrile.  Pulse was 68 and regular, respiratory rate 15, blood pressure  128/66.  HEENT:  He has small pupils.  Arterial narrowing is noted on fundal  examination.  He is wearing hearing aids bilaterally.  The __________  examination is unremarkable.  NECK:  Thyroid is normal to palpation.  CARDIAC:  He has a grade 1/2 systolic murmur.  Breath sounds are decreased  but chest is clear.  There is a rumble in the right carotid.  ABDOMEN:  Decreased bowel sounds.  Abdomen is soft with  diffuse tenderness  without localizing findings.  RECTAL:  Stool was negative for blood by Hemoccult but he is tender in the  right adnexal area.  Prostate is normal.  The remainder of the genitourinary  examination is negative.  MUSCULOSKELETAL:  Unremarkable.  SKIN:  Warm and dry.  NEUROLOGIC:  There were no localizing neuropsychiatric deficits.   ASSESSMENT:  His abdominal pain is somewhat baffling in its pattern and the  physical findings nondiagnostic.  Initially, the question was of gallbladder  disease related to the fried foods but the right adnexal tenderness suggests  the possibility of some other process such as appendicitis.   Because of his age, comorbidities, specifically coronary artery disease, he  will be admitted with CAT scans.  Surgical consultation may be necessary.                                               Titus Dubin. Alwyn Ren, M.D. [facility]    [facility]/MEDQ  D:  01/09/2003  T:  01/09/2003  Job:  [id number]

## 2011-06-02 ENCOUNTER — Ambulatory Visit (INDEPENDENT_AMBULATORY_CARE_PROVIDER_SITE_OTHER): Payer: Medicare Other | Admitting: Cardiology

## 2011-06-02 ENCOUNTER — Encounter: Payer: Self-pay | Admitting: Cardiology

## 2011-06-02 VITALS — BP 124/70 | HR 64 | Ht 75.0 in | Wt 226.0 lb

## 2011-06-02 DIAGNOSIS — R0989 Other specified symptoms and signs involving the circulatory and respiratory systems: Secondary | ICD-10-CM | POA: Insufficient documentation

## 2011-06-02 DIAGNOSIS — I25810 Atherosclerosis of coronary artery bypass graft(s) without angina pectoris: Secondary | ICD-10-CM

## 2011-06-02 DIAGNOSIS — I779 Disorder of arteries and arterioles, unspecified: Secondary | ICD-10-CM | POA: Insufficient documentation

## 2011-06-02 MED ORDER — NITROGLYCERIN 0.4 MG SL SUBL: 0.4000 mg | SUBLINGUAL_TABLET | SUBLINGUAL | Status: DC | PRN

## 2011-06-02 NOTE — Progress Notes (Signed)
HPI Fernando Walker returns today for evaluation and management of his coronary disease and carotid disease. He's had no symptoms of angina or TIAs. He is no longer carrying several nitroglycerin.  Medications reviewed and he is compliant.  Left carotid Dopplers were in June 2010. Will arrange for a followup study.  EKG today shows normal sinus rhythm with nonspecific ST segment changes.  On further history reviewed, he states that he does get low substernal tightness when he brings a trash of the driveway. This has been stable. He is not take nitroglycerin. Past Medical History  Diagnosis Date  . CAD (coronary artery disease) of artery bypass graft   . Carotid arterial disease     Nonobstructive  . Hypertension   . Dyslipidemia   . Hypertrophy of prostate with urinary obstruction and other lower urinary tract symptoms (LUTS)   . Dermatitis   . Head injury     Outpatient  . Indigestion     Acute  . Pneumonia     As a child    Past Surgical History  Procedure Date  . Coronary artery bypass graft     7x    Family History  Problem Relation Age of Onset  . Stroke Father   . Diabetes Neg Hx   . Cancer Neg Hx   . GER disease Neg Hx   . Heart attack Neg Hx     History   Social History  . Marital Status: Married    Spouse Name: N/A    Number of Children: N/A  . Years of Education: N/A   Occupational History  . Not on file.   Social History Main Topics  . Smoking status: Former Smoker    Quit date: 10/18/1984  . Smokeless tobacco: Not on file  . Alcohol Use: 1.0 - 1.5 oz/week    2-3 drink(s) per week  . Drug Use: Not on file  . Sexually Active: Not on file   Other Topics Concern  . Not on file   Social History Narrative  . No narrative on file    Allergies  Allergen Reactions  . Sulfonamide Derivatives     Current Outpatient Prescriptions  Medication Sig Dispense Refill  . aspirin 81 MG tablet Take 81 mg by mouth daily.        . bimatoprost (LUMIGAN)  0.03 % ophthalmic solution 1 drop as directed.        . doxycycline (VIBRAMYCIN) 50 MG capsule Take 50 mg by mouth daily.        . finasteride (PROSCAR) 5 MG tablet Take 5 mg by mouth daily.        . simvastatin (ZOCOR) 40 MG tablet Take 40 mg by mouth daily.        . trandolapril (MAVIK) 4 MG tablet Take 4 mg by mouth at bedtime.          ROS Negative other than HPI.   PE General Appearance: well developed, well nourished in no acute distress HEENT: symmetrical face, PERRLA, good dentition  Neck: no JVD, thyromegaly, or adenopathy, trachea midline Chest: symmetric without deformity Cardiac: PMI non-displaced, RRR, normal S1, S2, no gallop or murmur Lung: clear to ausculation and percussion Vascular: all pulses full, right carotid bruit and Abdominal: nondistended, nontender, good bowel sounds, no HSM, no bruits Extremities: no cyanosis, clubbing or edema, no sign of DVT, no varicosities  Skin: normal color, no rashes Neuro: alert and oriented x 3, non-focal Pysch: normal affect Filed Vitals:   06/02/11  1035  BP: 124/70  Pulse: 64  Height: 6\' 3"  (1.905 m)  Weight: 226 lb (102.513 kg)    EKG  Labs and Studies Reviewed.   Lab Results  Component Value Date   WBC 8.7 04/29/2009   HGB 14.1 04/29/2009   HCT 41.3 04/29/2009   MCV 92.1 04/29/2009   PLT 223.0 04/29/2009      Chemistry      Component Value Date/Time   NA 139 04/29/2009 1607   K 4.8 06/08/2010 0915   CL 104 04/29/2009 1607   CO2 30 04/29/2009 1607   BUN 17 06/08/2010 0915   CREATININE 1.1 06/08/2010 0915      Component Value Date/Time   CALCIUM 9.4 04/29/2009 1607   ALKPHOS 40 06/08/2010 0915   AST 28 06/08/2010 0915   ALT 30 06/08/2010 0915   BILITOT 1.2 06/08/2010 0915       Lab Results  Component Value Date   CHOL 143 06/08/2010   CHOL 127 04/24/2009   CHOL 119 10/02/2007   Lab Results  Component Value Date   HDL 43.90 06/08/2010   HDL 40.98 04/24/2009   HDL 44.6 10/02/2007   Lab Results  Component  Value Date   LDLCALC 87 06/08/2010   LDLCALC 64 04/24/2009   LDLCALC 61 10/02/2007   Lab Results  Component Value Date   TRIG 62.0 06/08/2010   TRIG 66.0 04/24/2009   TRIG 66 10/02/2007   Lab Results  Component Value Date   CHOLHDL 3 06/08/2010   CHOLHDL 3 04/24/2009   CHOLHDL 2.7 CALC 10/02/2007   Lab Results  Component Value Date   HGBA1C 6.3 06/08/2010   Lab Results  Component Value Date   ALT 30 06/08/2010   AST 28 06/08/2010   ALKPHOS 40 06/08/2010   BILITOT 1.2 06/08/2010   No results found for this basename: TSH

## 2011-06-02 NOTE — Patient Instructions (Signed)
Your physician wants you to follow-up in: 1 YEAR.  You will receive a reminder letter in the mail two months in advance. If you don't receive a letter, please call our office to schedule the follow-up appointment.  Your physician recommends that you continue on your current medications as directed. Please refer to the Current Medication list given to you today.  Your physician has requested that you have a carotid duplex. This test is an ultrasound of the carotid arteries in your neck. It looks at blood flow through these arteries that supply the brain with blood. Allow one hour for this exam. There are no restrictions or special instructions.  The proper use and anticipated side effects of nitroglycerine has been carefully explained.  If a single episode of chest pain is not relieved by one tablet, the patient will try another within 5 minutes; and if this doesn't relieve the pain, the patient is instructed to call 911 for transportation to an emergency department.

## 2011-06-02 NOTE — Assessment & Plan Note (Signed)
Stable. Sublingual nitroglycerin renewed.

## 2011-06-10 ENCOUNTER — Telehealth: Payer: Self-pay | Admitting: Cardiology

## 2011-06-10 NOTE — Telephone Encounter (Signed)
Med questions on nitro

## 2011-06-10 NOTE — Telephone Encounter (Signed)
Pt calls today b/c he was concerned about taking sl NTG.  He has not been having any chest pain or angina.  He was concerned that "am I a walking time bomb?!"  Reassurance given to patient especially about having the ntg available. He did not experience any difficulties while at the beach last weekend. Mylo Red RN

## 2011-06-15 ENCOUNTER — Other Ambulatory Visit: Payer: Self-pay | Admitting: Internal Medicine

## 2011-06-15 NOTE — Telephone Encounter (Signed)
Patient needs to schedule  CPX  

## 2011-07-19 ENCOUNTER — Telehealth: Payer: Self-pay | Admitting: Cardiology

## 2011-07-19 NOTE — Telephone Encounter (Signed)
Please call about husbands BP 165/75 when he exerts himself Pt's wife wants to talk to you about it

## 2011-07-19 NOTE — Telephone Encounter (Signed)
Per pt wife calling back to speak with nurse regading her husband.

## 2011-07-19 NOTE — Telephone Encounter (Signed)
I spoke with pt wife, husband also at the phone, about increasing "chest tightness" when walking to the mailbox.   Chest tightness resolves when activity stops. Nonradiating  Wife does note pt has been anxious lately. Asked when last stress test was. 2 yrs ago. No shortness of breath noted. Pt has not taken any ntg. Instructions given on  how to take ntg and activate ems to wife & pt.   At rest: Blood pressures have been 130-135/70-75 at rest.  With exertion: 160-165/70-75. Offered appt with PA or Dr. Daleen Squibb, but wife & pt declined at this time. Mylo Red RN

## 2011-07-27 ENCOUNTER — Encounter (INDEPENDENT_AMBULATORY_CARE_PROVIDER_SITE_OTHER): Payer: Medicare Other | Admitting: *Deleted

## 2011-07-27 ENCOUNTER — Encounter: Payer: Self-pay | Admitting: Internal Medicine

## 2011-07-27 ENCOUNTER — Ambulatory Visit (INDEPENDENT_AMBULATORY_CARE_PROVIDER_SITE_OTHER): Payer: Medicare Other | Admitting: Internal Medicine

## 2011-07-27 VITALS — BP 140/78 | HR 77 | Temp 98.6°F | Resp 12 | Ht 74.5 in | Wt 229.0 lb

## 2011-07-27 DIAGNOSIS — R7309 Other abnormal glucose: Secondary | ICD-10-CM

## 2011-07-27 DIAGNOSIS — I25810 Atherosclerosis of coronary artery bypass graft(s) without angina pectoris: Secondary | ICD-10-CM

## 2011-07-27 DIAGNOSIS — Z0000 Encounter for general adult medical examination without abnormal findings: Secondary | ICD-10-CM

## 2011-07-27 DIAGNOSIS — I10 Essential (primary) hypertension: Secondary | ICD-10-CM

## 2011-07-27 DIAGNOSIS — Z86010 Personal history of colonic polyps: Secondary | ICD-10-CM

## 2011-07-27 DIAGNOSIS — E785 Hyperlipidemia, unspecified: Secondary | ICD-10-CM

## 2011-07-27 DIAGNOSIS — Z23 Encounter for immunization: Secondary | ICD-10-CM

## 2011-07-27 DIAGNOSIS — I6529 Occlusion and stenosis of unspecified carotid artery: Secondary | ICD-10-CM

## 2011-07-27 NOTE — Patient Instructions (Addendum)
Preventive Health Care: Take 6-8 weeks to build up to walking  at least 30-45 minutes a day,  3-4 days a week.  Eat a low-fat diet with lots of fruits and vegetables, up to 7-9 servings per day. Avoid obesity; your goal is waist measurement < 40 inches.Consume less than 40 grams of sugar per day from foods & drinks with High Fructose Corn Sugar as #1,2,3 or # 4 on label. Please  schedule fasting Labs : BMET,Lipids, hepatic panel, CBC & dif, TSH, A1c( see Diagnoses for Codes).  Please bring these instructions to that Lab appt.

## 2011-07-27 NOTE — Progress Notes (Signed)
Subjective:    Patient ID: Fernando Walker, male    DOB: [birth year], 77 y.o.   MRN: [id number]  HPI Medicare Wellness Visit:  The following psychosocial & medical history were reviewed as required by Medicare.   Social history: caffeine: 1.5  cups / day , alcohol:  1-2 / day ,  tobacco use : quit 1985  & exercise : no.   Home & personal  safety / fall risk: no issues, activities of daily living: no limitations , seatbelt use : yes , and smoke alarm employment : yes .  Power of Attorney/Living Will status : yes  Vision ( as recorded per Nurse) & Hearing  evaluation :   Ophth exam every 6 months for Glaucoma monitor. [employer] for aids; sen several weeks ago Orientation :oriented X3 , memory & recall : 2 of 3 , spelling or math testing: good,and mood & affect : normal . Depression / anxiety: no issues Travel history : [place] , immunization status :Shingles needed , transfusion history:  no, and preventive health surveillance ( colonoscopies, BMD , etc as per protocol/ [facility]): colonoscopy up to date, Dental care:  Every 6 mos . Chart reviewed &  Updated. Active issues reviewed & addressed.       Review of Systems HYPERTENSION: Disease Monitoring: Blood pressure range-up to 140/75  Chest pain, palpitations- occasional exertional chest pain ; Dr Daleen Squibb seen 8/12      Dyspnea- DOE Medications: Compliance- yes  Lightheadedness,Syncope- no    Edema- no  Fasting Hyperglycemia: Disease Monitoring: Blood Sugar ranges-not monitored  Polyuria/phagia/dipsia- no       Visual problems- see above Medications: Compliance- no meds    HYPERLIPIDEMIA: Disease Monitoring: See symptoms for Hypertension Medications: Compliance- yes  Abd pain, bowel changes- no   Muscle aches- no        Objective:   Physical Exam Gen.: Healthy and well-nourished in appearance. Alert, appropriate and cooperative throughout exam. Head: Normocephalic without obvious abnormalities;   alopecia    Eyes: No corneal or conjunctival inflammation noted. Pupils equal round  Extraocular motion intact.  Ears: External  ear exam reveals no significant lesions or deformities. Hearing aids bilaterally. Nose: External nasal exam reveals no deformity or inflammation. Nasal mucosa are pink and moist. No lesions or exudates noted.  Mouth: Oral mucosa and oropharynx reveal no lesions or exudates. Teeth in good repair. Neck: No deformities, masses, or tenderness noted. Range of motion &. Thyroid normal. Lungs: Normal respiratory effort; chest expands symmetrically. Lungs are clear to auscultation without rales, wheezes, or increased work of breathing. Heart: Normal rate and rhythm. Normal S1 and S2. No gallop, click, or rub. Grade 1-1.5 systolic murmur. Abdomen: Bowel sounds normal; abdomen soft and nontender. No masses, organomegaly.Ventral hernia noted. Genitalia: deferred   .                                                                                   Musculoskeletal/extremities: No deformity or scoliosis noted of  the thoracic or lumbar spine. No clubbing, cyanosis, edema, or deformity noted. Range of motion  normal .Tone & strength  normal.Joints normal. Nail health  good. Vascular: Carotid, radial artery,  dorsalis pedis and  posterior tibial pulses are full and equal. Faint L carotid  Bruit present. Neurologic: Alert and oriented x3. Deep tendon reflexes symmetrical and normal.          Skin: Intact without suspicious lesions or rashes. Lymph: No cervical, axillary  lymphadenopathy present. Psych: Mood and affect are normal. Normally interactive                                                                                         Assessment & Plan:  #1 Medicare Wellness Exam; criteria met ; data entered #2 Problem List reviewed ; Assessment/ Recommendations made Plan: see Orders

## 2011-07-29 ENCOUNTER — Other Ambulatory Visit (INDEPENDENT_AMBULATORY_CARE_PROVIDER_SITE_OTHER): Payer: Medicare Other

## 2011-07-29 DIAGNOSIS — I10 Essential (primary) hypertension: Secondary | ICD-10-CM

## 2011-07-29 DIAGNOSIS — E785 Hyperlipidemia, unspecified: Secondary | ICD-10-CM

## 2011-07-29 DIAGNOSIS — Z0000 Encounter for general adult medical examination without abnormal findings: Secondary | ICD-10-CM

## 2011-07-29 DIAGNOSIS — R7309 Other abnormal glucose: Secondary | ICD-10-CM

## 2011-07-29 DIAGNOSIS — Z86010 Personal history of colonic polyps: Secondary | ICD-10-CM

## 2011-07-29 LAB — CBC WITH DIFFERENTIAL/PLATELET
Basophils Relative: 0.5 % (ref 0.0–3.0)
Eosinophils Relative: 6.5 % — ABNORMAL HIGH (ref 0.0–5.0)
MCV: 92.8 fl (ref 78.0–100.0)
Monocytes Absolute: 0.7 10*3/uL (ref 0.1–1.0)
Neutrophils Relative %: 65.2 % (ref 43.0–77.0)
RBC: 4.69 Mil/uL (ref 4.22–5.81)
WBC: 7.6 10*3/uL (ref 4.5–10.5)

## 2011-07-29 LAB — HEPATIC FUNCTION PANEL
ALT: 27 U/L (ref 0–53)
Total Bilirubin: 0.6 mg/dL (ref 0.3–1.2)

## 2011-07-29 LAB — BASIC METABOLIC PANEL
CO2: 28 mEq/L (ref 19–32)
GFR: 66.3 mL/min (ref >60.00)
Glucose, Bld: 121 mg/dL — ABNORMAL HIGH (ref 70–99)
Potassium: 4 mEq/L (ref 3.5–5.1)
Sodium: 142 mEq/L (ref 135–145)

## 2011-07-29 LAB — LIPID PANEL
HDL: 50.5 mg/dL (ref >39.00)
Total CHOL/HDL Ratio: 3
VLDL: 15.4 mg/dL (ref 0.0–40.0)

## 2011-07-29 LAB — HEMOGLOBIN A1C: Hgb A1c MFr Bld: 6.1 % (ref 4.6–6.5)

## 2011-07-29 NOTE — Progress Notes (Signed)
Labs only

## 2011-08-17 ENCOUNTER — Ambulatory Visit (AMBULATORY_SURGERY_CENTER): Payer: Medicare Other | Admitting: *Deleted

## 2011-08-17 DIAGNOSIS — Z86010 Personal history of colonic polyps: Secondary | ICD-10-CM

## 2011-08-17 DIAGNOSIS — Z1211 Encounter for screening for malignant neoplasm of colon: Secondary | ICD-10-CM

## 2011-08-17 MED ORDER — PEG-KCL-NACL-NASULF-NA ASC-C 100 G PO SOLR: 1.0000 | Freq: Once | ORAL | Status: DC

## 2011-08-23 ENCOUNTER — Encounter: Payer: Self-pay | Admitting: Internal Medicine

## 2011-08-24 ENCOUNTER — Encounter: Payer: Self-pay | Admitting: Internal Medicine

## 2011-08-24 ENCOUNTER — Other Ambulatory Visit: Payer: Medicare Other | Admitting: Gastroenterology

## 2011-08-24 ENCOUNTER — Ambulatory Visit (INDEPENDENT_AMBULATORY_CARE_PROVIDER_SITE_OTHER): Payer: Medicare Other | Admitting: Internal Medicine

## 2011-08-24 VITALS — BP 128/86 | HR 74 | Temp 98.6°F | Wt 226.4 lb

## 2011-08-24 DIAGNOSIS — D489 Neoplasm of uncertain behavior, unspecified: Secondary | ICD-10-CM

## 2011-08-24 MED ORDER — FINASTERIDE 5 MG PO TABS: ORAL_TABLET | ORAL | Status: DC

## 2011-08-24 MED ORDER — SIMVASTATIN 40 MG PO TABS: 40.0000 mg | ORAL_TABLET | Freq: Every day | ORAL | Status: DC

## 2011-08-24 MED ORDER — TRANDOLAPRIL 4 MG PO TABS: 4.0000 mg | ORAL_TABLET | Freq: Every day | ORAL | Status: DC

## 2011-08-24 NOTE — Progress Notes (Signed)
  Subjective:    Patient ID: Fernando Walker, male    DOB: 04-06-1933, 77 y.o.   MRN: [id number]  HPI Ear Lesion: Onset:3-4 weeks ago as dark streak 1/4 inch long @ R  lower ear lobe Trigger/injury:no except Yellow jacket sting @ upper ear  Pain, redness swelling:no but increasing in size Constitutional: no fever, chills, sweats Heme:no abnormal bruising or clotting, lymphadenopathy Treatment/response:none   Past medical history is negative for any skin malignancies.    Review of Systems     Objective:   Physical Exam he appears healthy and in no distress  He has no lymphadenopathy about the neck or axilla  He has no hepatomegaly or splenomegaly  There is a 10 by 10 mm fleshy polypoid lesion on the right inferior  earlobe. Below this is a 15 x 10 mm faintly brown lesion with slight speckling of slightly darker pigment. Both lesions appear soft to touch with no significant keratotic change. Neither lesion is tender or has increased temperature.        Assessment & Plan:  #1 neoplasm of uncertain etiology. Clinically benign, nonmalignant process suggested.  Plan: See orders and recommendations.

## 2011-08-24 NOTE — Patient Instructions (Signed)
Use Aveeno Daily  Moisturizing lotion  twice a day  to the lesions

## 2011-08-24 NOTE — Progress Notes (Signed)
Addended by: Edgardo Roys on: 08/24/2011 04:31 PM   Modules accepted: Orders

## 2011-09-01 ENCOUNTER — Ambulatory Visit (AMBULATORY_SURGERY_CENTER): Payer: Medicare Other | Admitting: Gastroenterology

## 2011-09-01 ENCOUNTER — Encounter: Payer: Self-pay | Admitting: Gastroenterology

## 2011-09-01 DIAGNOSIS — Z860100 Personal history of colon polyps, unspecified: Secondary | ICD-10-CM

## 2011-09-01 DIAGNOSIS — Z1211 Encounter for screening for malignant neoplasm of colon: Secondary | ICD-10-CM

## 2011-09-01 MED ORDER — SODIUM CHLORIDE 0.9 % IV SOLN: 500.0000 mL | INTRAVENOUS | Status: DC

## 2011-09-01 NOTE — Patient Instructions (Signed)
Please refer to your blue and neon green sheets for instructions regarding diet and activity for the rest of today.  You may resume your medications as you would normally take them.   Diverticulosis Diverticulosis is a common condition that develops when small pouches (diverticula) form in the wall of the colon. The risk of diverticulosis increases with age. It happens more often in people who eat a low-fiber diet. Most individuals with diverticulosis have no symptoms. Those individuals with symptoms usually experience abdominal pain, constipation, or loose stools (diarrhea). HOME CARE INSTRUCTIONS   Increase the amount of fiber in your diet as directed by your caregiver or dietician. This may reduce symptoms of diverticulosis.   Your caregiver may recommend taking a dietary fiber supplement.   Drink at least 6 to 8 glasses of water each day to prevent constipation.   Try not to strain when you have a bowel movement.   Your caregiver may recommend avoiding nuts and seeds to prevent complications, although this is still an uncertain benefit.   Only take over-the-counter or prescription medicines for pain, discomfort, or fever as directed by your caregiver.  FOODS WITH HIGH FIBER CONTENT INCLUDE:  Fruits. Apple, peach, pear, tangerine, raisins, prunes.   Vegetables. Brussels sprouts, asparagus, broccoli, cabbage, carrot, cauliflower, romaine lettuce, spinach, summer squash, tomato, winter squash, zucchini.   Starchy Vegetables. Baked beans, kidney beans, lima beans, split peas, lentils, potatoes (with skin).   Grains. Whole wheat bread, brown rice, bran flake cereal, plain oatmeal, white rice, shredded wheat, bran muffins.  SEEK IMMEDIATE MEDICAL CARE IF:   You develop increasing pain or severe bloating.   You have an oral temperature above 102 F (38.9 C), not controlled by medicine.   You develop vomiting or bowel movements that are bloody or black.  Document Released: 07/01/2004  Document Revised: 06/16/2011 Document Reviewed: 03/04/2010 [employer] Patient Information 2012 [employer].  Hemorrhoids Hemorrhoids are veins in the rectum that get big. These veins can get blocked. Blocked veins become puffy (swollen) and painful. HOME CARE  Eat more fiber.   Drink enough fluid to keep your pee (urine) clear or pale yellow.   Exercise often.   Avoid straining to poop (bowel movement).   Keep the butt area dry and clean.   Only take medicine as told by your doctor.  If your hemorrhoids are puffy and painful:  Take a warm bath for 20 to 30 minutes. Do this 3 to 4 times a day.   Place ice packs on the area. Use the ice packs between the baths.   Put ice in a plastic bag.   Place a towel between your skin and the bag.   Leave the ice on for 15 to 20 minutes, 3 to 4 times a day.   Do not use a donut-shaped pillow. Do not sit on the toilet for a long time.   Go to the bathroom when your body has the urge to poop. This is so you do not strain as much to poop.  GET HELP RIGHT AWAY IF:   You have increasing pain that is not controlled with medicine.   You have uncontrolled bleeding.   You cannot poop.   You have pain or puffiness outside the area of the hemorrhoids.   You have chills.   You have a temperature by mouth above 102 F (38.9 C), not controlled by medicine.  MAKE SURE YOU:   Understand these instructions.   Will watch your condition.   Will   get help right away if you are not doing well or get worse.  Document Released: 07/13/2008 Document Revised: 06/16/2011 Document Reviewed: 07/13/2008 [employer] Patient Information 2012 [employer]. 

## 2011-09-02 ENCOUNTER — Telehealth: Payer: Self-pay | Admitting: *Deleted

## 2011-09-02 NOTE — Telephone Encounter (Signed)

## 2011-09-07 ENCOUNTER — Other Ambulatory Visit: Payer: Self-pay | Admitting: Cardiology

## 2011-09-07 ENCOUNTER — Other Ambulatory Visit: Payer: Self-pay | Admitting: Internal Medicine

## 2011-12-02 DIAGNOSIS — H409 Unspecified glaucoma: Secondary | ICD-10-CM | POA: Diagnosis not present

## 2011-12-02 DIAGNOSIS — H4011X0 Primary open-angle glaucoma, stage unspecified: Secondary | ICD-10-CM | POA: Diagnosis not present

## 2012-02-09 DIAGNOSIS — L723 Sebaceous cyst: Secondary | ICD-10-CM | POA: Diagnosis not present

## 2012-02-09 DIAGNOSIS — D235 Other benign neoplasm of skin of trunk: Secondary | ICD-10-CM | POA: Diagnosis not present

## 2012-03-06 DIAGNOSIS — H4011X0 Primary open-angle glaucoma, stage unspecified: Secondary | ICD-10-CM | POA: Diagnosis not present

## 2012-03-06 DIAGNOSIS — H409 Unspecified glaucoma: Secondary | ICD-10-CM | POA: Diagnosis not present

## 2012-06-05 DIAGNOSIS — H409 Unspecified glaucoma: Secondary | ICD-10-CM | POA: Diagnosis not present

## 2012-06-05 DIAGNOSIS — H4011X0 Primary open-angle glaucoma, stage unspecified: Secondary | ICD-10-CM | POA: Diagnosis not present

## 2012-06-12 ENCOUNTER — Ambulatory Visit (INDEPENDENT_AMBULATORY_CARE_PROVIDER_SITE_OTHER): Payer: Medicare Other | Admitting: Cardiology

## 2012-06-12 ENCOUNTER — Encounter: Payer: Self-pay | Admitting: Cardiology

## 2012-06-12 VITALS — BP 132/70 | HR 58 | Ht 74.0 in | Wt 224.0 lb

## 2012-06-12 DIAGNOSIS — I25810 Atherosclerosis of coronary artery bypass graft(s) without angina pectoris: Secondary | ICD-10-CM | POA: Diagnosis not present

## 2012-06-12 DIAGNOSIS — I10 Essential (primary) hypertension: Secondary | ICD-10-CM

## 2012-06-12 DIAGNOSIS — E785 Hyperlipidemia, unspecified: Secondary | ICD-10-CM

## 2012-06-12 DIAGNOSIS — R0989 Other specified symptoms and signs involving the circulatory and respiratory systems: Secondary | ICD-10-CM | POA: Diagnosis not present

## 2012-06-12 DIAGNOSIS — R7309 Other abnormal glucose: Secondary | ICD-10-CM | POA: Diagnosis not present

## 2012-06-12 NOTE — Patient Instructions (Addendum)
Your physician recommends that you continue on your current medications as directed. Please refer to the Current Medication list given to you today.  Your physician wants you to follow-up in: 1 year. You will receive a reminder letter in the mail two months in advance. If you don't receive a letter, please call our office to schedule the follow-up appointment.  

## 2012-06-12 NOTE — Assessment & Plan Note (Signed)
Good control

## 2012-06-12 NOTE — Assessment & Plan Note (Signed)
Stable. Continue aggressive secondary preventive therapy. Cardiac catheterization several years ago showed patent grafts. Revisited all this with patient and his wife today.

## 2012-06-12 NOTE — Progress Notes (Signed)
HPI Fernando Walker returns today for evaluation and management of his coronary artery disease, history of prior bypass surgery in 1995, nonobstructive carotid disease.  He is very compliant with his medications. He denies any chest pain or angina. He's had no symptoms of TIAs or mini strokes. He is a stable right carotid bruit. He's had nonobstructive carotid plaque most recent ultrasound last year.  Past Medical History  Diagnosis Date  . CAD (coronary artery disease) of artery bypass graft   . Carotid arterial disease     Nonobstructive  . Hypertension   . Dyslipidemia   . Hypertrophy of prostate with urinary obstruction and other lower urinary tract symptoms (LUTS)   . Pneumonia     As a child  . Cataract   . GERD (gastroesophageal reflux disease)   . Hyperlipidemia   . Pneumonia     as child    Current Outpatient Prescriptions  Medication Sig Dispense Refill  . aspirin 81 MG tablet Take 81 mg by mouth daily.        . bimatoprost (LUMIGAN) 0.03 % ophthalmic solution 1 drop as directed.        . finasteride (PROSCAR) 5 MG tablet TAKE 1 TABLET BY MOUTH EVERY DAY  90 tablet  3  . simvastatin (ZOCOR) 40 MG tablet TAKE 1 TABLET EVERY DAY  90 tablet  3  . trandolapril (MAVIK) 4 MG tablet Take 1 tablet (4 mg total) by mouth at bedtime.  90 tablet  3    Allergies  Allergen Reactions  . Sulfonamide Derivatives     rash    Family History  Problem Relation Age of Onset  . Stroke Father 9  . Diabetes Neg Hx   . Cancer Neg Hx   . GER disease Neg Hx   . Heart attack Neg Hx   . Colon cancer Neg Hx   . Esophageal cancer Neg Hx   . Stomach cancer Neg Hx     History   Social History  . Marital Status: Married    Spouse Name: N/A    Number of Children: N/A  . Years of Education: N/A   Occupational History  . Not on file.   Social History Main Topics  . Smoking status: Former Smoker    Quit date: 10/18/1984  . Smokeless tobacco: Not on file  . Alcohol Use: 4.2 oz/week    7 Glasses of wine per week  . Drug Use: Not on file  . Sexually Active: Not on file   Other Topics Concern  . Not on file   Social History Narrative  . No narrative on file    ROS ALL NEGATIVE EXCEPT THOSE NOTED IN HPI  PE  General Appearance: well developed, well nourished in no acute distress HEENT: symmetrical face, PERRLA, good dentition  Neck: no JVD, thyromegaly, or adenopathy, trachea midline Chest: symmetric without deformity Cardiac: PMI non-displaced, RRR, normal S1, S2, no gallop or murmur Lung: clear to ausculation and percussion Vascular: all pulses full, right carotid bruit  Abdominal: nondistended, nontender, good bowel sounds, no HSM, no bruits Extremities: no cyanosis, clubbing or edema, no sign of DVT, no varicosities  Skin: normal color, no rashes Neuro: alert and oriented x 3, non-focal Pysch: normal affect  EKG Sinus bradycardia, otherwise normal EKG BMET    Component Value Date/Time   NA 142 07/29/2011 1030   K 4.0 07/29/2011 1030   CL 107 07/29/2011 1030   CO2 28 07/29/2011 1030   GLUCOSE 121* 07/29/2011  1030   BUN 20 07/29/2011 1030   CREATININE 1.1 07/29/2011 1030   CALCIUM 9.2 07/29/2011 1030   GFRNONAA 69.51 04/29/2009 1607   GFRAA 70 12/19/2006 1557    Lipid Panel     Component Value Date/Time   CHOL 137 07/29/2011 1030   TRIG 77.0 07/29/2011 1030   HDL 50.50 07/29/2011 1030   CHOLHDL 3 07/29/2011 1030   VLDL 15.4 07/29/2011 1030   LDLCALC 71 07/29/2011 1030    CBC    Component Value Date/Time   WBC 7.6 07/29/2011 1030   RBC 4.69 07/29/2011 1030   HGB 14.5 07/29/2011 1030   HCT 43.5 07/29/2011 1030   PLT 223.0 07/29/2011 1030   MCV 92.8 07/29/2011 1030   MCHC 33.4 07/29/2011 1030   RDW 13.6 07/29/2011 1030   LYMPHSABS 1.5 07/29/2011 1030   MONOABS 0.7 07/29/2011 1030   EOSABS 0.5 07/29/2011 1030   BASOSABS 0.0 07/29/2011 1030

## 2012-06-12 NOTE — Assessment & Plan Note (Signed)
Stable. Continue secondary preventative therapy. Repeat carotid Dopplers in October of 2014.

## 2012-07-27 ENCOUNTER — Other Ambulatory Visit: Payer: Self-pay | Admitting: *Deleted

## 2012-07-27 DIAGNOSIS — I6529 Occlusion and stenosis of unspecified carotid artery: Secondary | ICD-10-CM

## 2012-07-28 ENCOUNTER — Encounter (INDEPENDENT_AMBULATORY_CARE_PROVIDER_SITE_OTHER): Payer: Medicare Other

## 2012-07-28 DIAGNOSIS — I6529 Occlusion and stenosis of unspecified carotid artery: Secondary | ICD-10-CM | POA: Diagnosis not present

## 2012-08-03 ENCOUNTER — Ambulatory Visit (INDEPENDENT_AMBULATORY_CARE_PROVIDER_SITE_OTHER): Payer: Medicare Other

## 2012-08-03 DIAGNOSIS — Z23 Encounter for immunization: Secondary | ICD-10-CM | POA: Diagnosis not present

## 2012-09-05 ENCOUNTER — Other Ambulatory Visit: Payer: Self-pay | Admitting: Internal Medicine

## 2012-09-05 NOTE — Telephone Encounter (Signed)
Rx sent for only 30 day supply pt need appt last OV 08/24/11.     MW

## 2012-09-07 DIAGNOSIS — Z961 Presence of intraocular lens: Secondary | ICD-10-CM | POA: Diagnosis not present

## 2012-09-07 DIAGNOSIS — H02839 Dermatochalasis of unspecified eye, unspecified eyelid: Secondary | ICD-10-CM | POA: Diagnosis not present

## 2012-09-07 DIAGNOSIS — H409 Unspecified glaucoma: Secondary | ICD-10-CM | POA: Diagnosis not present

## 2012-09-07 DIAGNOSIS — H4011X0 Primary open-angle glaucoma, stage unspecified: Secondary | ICD-10-CM | POA: Diagnosis not present

## 2012-09-13 ENCOUNTER — Encounter: Payer: Self-pay | Admitting: Internal Medicine

## 2012-09-13 ENCOUNTER — Ambulatory Visit (INDEPENDENT_AMBULATORY_CARE_PROVIDER_SITE_OTHER): Payer: Medicare Other | Admitting: Internal Medicine

## 2012-09-13 VITALS — BP 128/80 | HR 58 | Wt 226.0 lb

## 2012-09-13 DIAGNOSIS — R0989 Other specified symptoms and signs involving the circulatory and respiratory systems: Secondary | ICD-10-CM

## 2012-09-13 DIAGNOSIS — Z23 Encounter for immunization: Secondary | ICD-10-CM

## 2012-09-13 DIAGNOSIS — R5381 Other malaise: Secondary | ICD-10-CM

## 2012-09-13 DIAGNOSIS — I10 Essential (primary) hypertension: Secondary | ICD-10-CM

## 2012-09-13 DIAGNOSIS — E785 Hyperlipidemia, unspecified: Secondary | ICD-10-CM | POA: Diagnosis not present

## 2012-09-13 DIAGNOSIS — R7309 Other abnormal glucose: Secondary | ICD-10-CM | POA: Diagnosis not present

## 2012-09-13 DIAGNOSIS — R5383 Other fatigue: Secondary | ICD-10-CM

## 2012-09-13 LAB — CBC WITH DIFFERENTIAL/PLATELET
Basophils Absolute: 0.1 K/uL (ref 0.0–0.1)
Basophils Relative: 0.6 % (ref 0.0–3.0)
Eosinophils Absolute: 0.5 K/uL (ref 0.0–0.7)
Eosinophils Relative: 5.3 % — ABNORMAL HIGH (ref 0.0–5.0)
HCT: 45.7 % (ref 39.0–52.0)
Hemoglobin: 14.9 g/dL (ref 13.0–17.0)
Lymphocytes Relative: 20.1 % (ref 12.0–46.0)
Lymphs Abs: 1.9 K/uL (ref 0.7–4.0)
MCHC: 32.6 g/dL (ref 30.0–36.0)
MCV: 92.2 fl (ref 78.0–100.0)
Monocytes Absolute: 1 K/uL (ref 0.1–1.0)
Monocytes Relative: 10.7 % (ref 3.0–12.0)
Neutro Abs: 6.1 K/uL (ref 1.4–7.7)
Neutrophils Relative %: 63.3 % (ref 43.0–77.0)
Platelets: 274 K/uL (ref 150.0–400.0)
RBC: 4.96 Mil/uL (ref 4.22–5.81)
RDW: 13.2 % (ref 11.5–14.6)
WBC: 9.6 K/uL (ref 4.5–10.5)

## 2012-09-13 LAB — LIPID PANEL
HDL: 58.2 mg/dL (ref >39.00)
LDL Cholesterol: 94 mg/dL (ref 0–99)
Total CHOL/HDL Ratio: 3

## 2012-09-13 LAB — BASIC METABOLIC PANEL WITH GFR
BUN: 16 mg/dL (ref 6–23)
CO2: 29 meq/L (ref 19–32)
Calcium: 9.6 mg/dL (ref 8.4–10.5)
Chloride: 102 meq/L (ref 96–112)
Creatinine, Ser: 1.1 mg/dL (ref 0.4–1.5)
GFR: 70.36 mL/min (ref >60.00)
Glucose, Bld: 101 mg/dL — ABNORMAL HIGH (ref 70–99)
Potassium: 4.4 meq/L (ref 3.5–5.1)
Sodium: 138 meq/L (ref 135–145)

## 2012-09-13 LAB — TSH: TSH: 2.33 u[IU]/mL (ref 0.35–5.50)

## 2012-09-13 LAB — HEPATIC FUNCTION PANEL
Bilirubin, Direct: 0.1 mg/dL (ref 0.0–0.3)
Total Bilirubin: 0.7 mg/dL (ref 0.3–1.2)

## 2012-09-13 LAB — HEMOGLOBIN A1C: Hgb A1c MFr Bld: 6.1 % (ref 4.6–6.5)

## 2012-09-13 MED ORDER — TRANDOLAPRIL 4 MG PO TABS: 4.0000 mg | ORAL_TABLET | Freq: Every day | ORAL | Status: DC

## 2012-09-13 MED ORDER — FINASTERIDE 5 MG PO TABS: 5.0000 mg | ORAL_TABLET | Freq: Every day | ORAL | Status: DC

## 2012-09-13 NOTE — Assessment & Plan Note (Addendum)
Statin was decreased to 20 mg daily approximately 09/02/12 because of fatigue. Lipids  & CK will be checked

## 2012-09-13 NOTE — Patient Instructions (Addendum)
Blood Pressure Goal  Ideally is an AVERAGE < 135/85. This AVERAGE should be calculated from @ least 5-7 BP readings taken @ different times of day on different days of week. You should not respond to isolated BP readings , but rather the AVERAGE for that week.  If you activate My Chart; the results can be released to you as soon as they populate from the lab. If you choose not to use this program; the labs have to be reviewed, copied & mailed   causing a delay in getting the results to you.  

## 2012-09-13 NOTE — Assessment & Plan Note (Signed)
A1c will be checked with urine microalbumin

## 2012-09-13 NOTE — Assessment & Plan Note (Signed)
Blood pressure control appears to be good;BMET will be checked

## 2012-09-13 NOTE — Progress Notes (Signed)
  Subjective:    Patient ID: Fernando Walker, male    DOB: 10-Aug-1933, 78 y.o.   MRN: [id number]  HPI He returns for F/U of fasting hyperglycemia , HTN , & dyslipidemia.   The most recent lipids 10/12  reveal LDL 71 , HDL 50.5  , and triglycerides 77   . There has been medical compliance with the statin; but because ofunexplained fatigue for 5 years;he cut statin in half last week.  Blood pressure  average is 133/70+  . There is medical compliance with antihypertensive medications  The most fasting glucose  07/2011   was 121; A1c was 6.1% No monitor of Fasting or two-hour postprandial glucose is  . Last ophthalmologic examination last week  revealed no retinopathy. No active podiatry assessment on record. No specific diet . Exercise limited .    Review of Systems  Constitutional: No fever, chills, significant weight change,  weakness or night sweats Eyes: No  blurred vision, double vision, or loss of vision Cardiovascular: no chest pain, palpitations, racing, irregular rhythm,syncope,nausea,sweating, claudication, or edema  Respiratory: No exertinal dyspnea, paroxysmal nocturnal dyspnea Gastrointestinal: No heartburn,dysphagia, diarrhea, significant constipation, rectal bleeding, melena Genitourinary: No dysuria,hematuria, pyuria, frequency,  incontinence, nocturia Musculoskeletal: No myalgias or muscle cramping , weakness, or cyanosis Dermatologic: No rash, pruritus, urticaria, or change in color or temperature of skin. Poor healing R calf since Summer 2013 Neurologic: No  limb weakness,  numbness or tingling Endocrine: No change in hair/skin/ nails, excessive thirst, excessive hunger, excessive urination.      Objective:   Physical Exam  Gen.: well-nourished in appearance. Alert, appropriate and cooperative throughout exam. Head: Normocephalic without obvious abnormalities;  pattern alopecia  Eyes: No corneal or conjunctival inflammation noted. Extraocular motion intact.    Ears: External  ear exam reveals no significant lesions or deformities.  Hearing aids bilaterally. Nose: External nasal exam reveals no deformity or inflammation. Nasal mucosa are pink and moist. No lesions or exudates noted. Mouth: Oral mucosa and oropharynx reveal no lesions or exudates. Teeth in good repair. Neck: No deformities, masses, or tenderness noted.  Thyroid normal. Lungs: Normal respiratory effort; chest expands symmetrically. Lungs are clear to auscultation without rales, wheezes, or increased work of breathing. Heart: Normal rate and rhythm. Normal S1 and S2. No gallop, click, or rub. Grade 1/6 systolic murmur with carotid radiation Abdomen: Bowel sounds normal; abdomen soft and nontender. No masses, organomegaly .Ventral hernia noted.                                                          Musculoskeletal/extremities: . No clubbing, cyanosis, edema noted. Tone & strength  Normal.Joints:minor DJD change. Nail health  good. Vascular: Carotid, radial artery, dorsalis pedis and  posterior tibial pulses are full and equal. Faint carotid bruits vs murmur radiation present. Neurologic: Alert and oriented x3. Deep tendon reflexes symmetrical and normal.  Light touch normal over feet.          Skin: Intact without suspicious lesions or rashes. Lymph: No cervical, axillary lymphadenopathy present. Psych: Mood and affect are normal. Normally interactive              Assessment & Plan:

## 2012-10-02 ENCOUNTER — Encounter: Payer: Self-pay | Admitting: Internal Medicine

## 2012-11-14 ENCOUNTER — Telehealth: Payer: Self-pay | Admitting: Internal Medicine

## 2012-11-14 DIAGNOSIS — E785 Hyperlipidemia, unspecified: Secondary | ICD-10-CM

## 2012-11-14 DIAGNOSIS — T887XXA Unspecified adverse effect of drug or medicament, initial encounter: Secondary | ICD-10-CM

## 2012-11-14 NOTE — Telephone Encounter (Signed)
Please contact patient and schedule fasting lab appointment. Order placed

## 2012-11-14 NOTE — Telephone Encounter (Signed)
Caller: Dot/Patient; Phone: [phone]; Reason for Call: Need order to have labwork for Cholesterol check.  Taking 1/2 dose of Simbastatin for past 3 months and was instructed to call to have labwork scheduled.  Please call Wife/Dot with appointment time and instructions for fasting.

## 2012-11-15 NOTE — Telephone Encounter (Signed)
Lab appt scheduled.

## 2012-11-21 ENCOUNTER — Other Ambulatory Visit (INDEPENDENT_AMBULATORY_CARE_PROVIDER_SITE_OTHER): Payer: Medicare Other

## 2012-11-21 DIAGNOSIS — E785 Hyperlipidemia, unspecified: Secondary | ICD-10-CM | POA: Diagnosis not present

## 2012-11-21 DIAGNOSIS — T887XXA Unspecified adverse effect of drug or medicament, initial encounter: Secondary | ICD-10-CM

## 2012-11-21 LAB — LIPID PANEL
Total CHOL/HDL Ratio: 3
Triglycerides: 75 mg/dL (ref 0.0–149.0)

## 2012-11-23 ENCOUNTER — Other Ambulatory Visit: Payer: Self-pay | Admitting: Internal Medicine

## 2012-11-23 MED ORDER — SIMVASTATIN 40 MG PO TABS: ORAL_TABLET | ORAL | Status: DC

## 2012-11-23 NOTE — Telephone Encounter (Signed)
Spoke with patient's wife, patient is currently taking 1/2 tab by mouth daily

## 2013-01-22 DIAGNOSIS — H02429 Myogenic ptosis of unspecified eyelid: Secondary | ICD-10-CM | POA: Diagnosis not present

## 2013-01-22 DIAGNOSIS — L918 Other hypertrophic disorders of the skin: Secondary | ICD-10-CM | POA: Diagnosis not present

## 2013-01-22 DIAGNOSIS — L905 Scar conditions and fibrosis of skin: Secondary | ICD-10-CM | POA: Diagnosis not present

## 2013-01-22 DIAGNOSIS — L908 Other atrophic disorders of skin: Secondary | ICD-10-CM | POA: Diagnosis not present

## 2013-01-22 DIAGNOSIS — H02419 Mechanical ptosis of unspecified eyelid: Secondary | ICD-10-CM | POA: Diagnosis not present

## 2013-01-22 DIAGNOSIS — H02409 Unspecified ptosis of unspecified eyelid: Secondary | ICD-10-CM | POA: Diagnosis not present

## 2013-06-14 ENCOUNTER — Encounter: Payer: Self-pay | Admitting: Cardiology

## 2013-06-14 ENCOUNTER — Ambulatory Visit (INDEPENDENT_AMBULATORY_CARE_PROVIDER_SITE_OTHER): Payer: Medicare Other | Admitting: Cardiology

## 2013-06-14 VITALS — BP 140/70 | HR 57 | Ht 74.0 in | Wt 224.0 lb

## 2013-06-14 DIAGNOSIS — R0609 Other forms of dyspnea: Secondary | ICD-10-CM | POA: Diagnosis not present

## 2013-06-14 DIAGNOSIS — R9439 Abnormal result of other cardiovascular function study: Secondary | ICD-10-CM | POA: Diagnosis not present

## 2013-06-14 DIAGNOSIS — I25810 Atherosclerosis of coronary artery bypass graft(s) without angina pectoris: Secondary | ICD-10-CM

## 2013-06-14 DIAGNOSIS — R0989 Other specified symptoms and signs involving the circulatory and respiratory systems: Secondary | ICD-10-CM

## 2013-06-14 NOTE — Assessment & Plan Note (Signed)
Stable. Work. Carotid Dopplers in October 2015. Continue secondary preventative therapy.

## 2013-06-14 NOTE — Progress Notes (Signed)
HPI [name] returns today for his history of coronary artery disease status post chronic bypass grafting. He is been having increased shortness of breath and his wife is quite concerned he may have recurrent disease. He had a problem with failure of several bypass grafts after surgery.  He denies any true angina.  He also has nonobstructive carotid artery disease. He is asymptomatic. Blood work is followed by primary care.  Past Medical History  Diagnosis Date  . CAD (coronary artery disease) of artery bypass graft   . Carotid arterial disease     Nonobstructive  . Hypertension   . Dyslipidemia   . Hypertrophy of prostate with urinary obstruction and other lower urinary tract symptoms (LUTS)   . Pneumonia     As a child  . Cataract   . GERD (gastroesophageal reflux disease)   . Hyperlipidemia   . Pneumonia     as child    Current Outpatient Prescriptions  Medication Sig Dispense Refill  . aspirin 81 MG tablet Take 81 mg by mouth daily.        . bimatoprost (LUMIGAN) 0.03 % ophthalmic solution 1 drop as directed.        . finasteride (PROSCAR) 5 MG tablet Take 1 tablet (5 mg total) by mouth daily.  90 tablet  3  . simvastatin (ZOCOR) 40 MG tablet 1/2 by mouth daily  45 tablet  3  . trandolapril (MAVIK) 4 MG tablet Take 1 tablet (4 mg total) by mouth daily.  90 tablet  3   No current facility-administered medications for this visit.    Allergies  Allergen Reactions  . Sulfonamide Derivatives     rash    Family History  Problem Relation Age of Onset  . Stroke Father 54  . Diabetes Neg Hx   . Cancer Neg Hx   . GER disease Neg Hx   . Heart attack Neg Hx   . Colon cancer Neg Hx   . Esophageal cancer Neg Hx   . Stomach cancer Neg Hx   . Heart disease Neg Hx     History   Social History  . Marital Status: Married    Spouse Name: N/A    Number of Children: N/A  . Years of Education: N/A   Occupational History  . Not on file.   Social History Main Topics   . Smoking status: Former Smoker    Quit date: 10/18/1984  . Smokeless tobacco: Not on file  . Alcohol Use: 4.2 oz/week    7 Glasses of wine per week  . Drug Use: No  . Sexual Activity: Not on file   Other Topics Concern  . Not on file   Social History Narrative  . No narrative on file    ROS ALL NEGATIVE EXCEPT THOSE NOTED IN HPI  PE  General Appearance: well developed, well nourished in no acute distress, overweight, ruddy complexion HEENT: symmetrical face, PERRLA, good dentition  Neck: no JVD, thyromegaly, or adenopathy, trachea midline Chest: symmetric without deformity Cardiac: PMI non-displaced, RRR, normal S1, S2, no gallop or murmur Lung: clear to ausculation and percussion Vascular: all pulses full without bruits  Abdominal: nondistended, nontender, good bowel sounds, no HSM, no bruits Extremities: no cyanosis, clubbing or edema, no sign of DVT, no varicosities  Skin: normal color, no rashes Neuro: alert and oriented x 3, non-focal Pysch: normal affect  EKG Sinus bradycardia, otherwise normal EKG BMET    Component Value Date/Time   NA 138 09/13/2012  1208   K 4.4 09/13/2012 1208   CL 102 09/13/2012 1208   CO2 29 09/13/2012 1208   GLUCOSE 101* 09/13/2012 1208   BUN 16 09/13/2012 1208   CREATININE 1.1 09/13/2012 1208   CALCIUM 9.6 09/13/2012 1208   GFRNONAA 69.51 04/29/2009 1607   GFRAA 70 12/19/2006 1557    Lipid Panel     Component Value Date/Time   CHOL 173 11/21/2012 1000   TRIG 75.0 11/21/2012 1000   HDL 56.40 11/21/2012 1000   CHOLHDL 3 11/21/2012 1000   VLDL 15.0 11/21/2012 1000   LDLCALC 102* 11/21/2012 1000    CBC    Component Value Date/Time   WBC 9.6 09/13/2012 1208   RBC 4.96 09/13/2012 1208   HGB 14.9 09/13/2012 1208   HCT 45.7 09/13/2012 1208   PLT 274.0 09/13/2012 1208   MCV 92.2 09/13/2012 1208   MCHC 32.6 09/13/2012 1208   RDW 13.2 09/13/2012 1208   LYMPHSABS 1.9 09/13/2012 1208   MONOABS 1.0 09/13/2012 1208   EOSABS 0.5 09/13/2012  1208   BASOSABS 0.1 09/13/2012 1208

## 2013-06-14 NOTE — Assessment & Plan Note (Signed)
His dyspnea on exertion has increased. We'll repeat stress Myoview to compare from previous abnormal study several years ago. Only proceed with catheterization if new findings.

## 2013-06-14 NOTE — Patient Instructions (Addendum)
Your physician has requested that you have a lexiscan myoview. For further information please visit [email or web address] Please follow instruction sheet, as given.  Your physician has requested that you have a carotid duplex October 2015. We will mail you a reminder to schedule your appointment This test is an ultrasound of the carotid arteries in your neck. It looks at blood flow through these arteries that supply the brain with blood. Allow one hour for this exam. There are no restrictions or special instructions.  Your physician recommends that you continue on your current medications as directed. Please refer to the Current Medication list given to you today.  Your physician wants you to follow-up in: 1 year with Dr. Earney Hamburg.  You will receive a reminder letter in the mail two months in advance. If you don't receive a letter, please call our office to schedule the follow-up appointment.

## 2013-06-27 ENCOUNTER — Ambulatory Visit (HOSPITAL_COMMUNITY): Payer: Medicare Other | Attending: Cardiology | Admitting: Radiology

## 2013-06-27 VITALS — BP 110/62 | Ht 74.0 in | Wt 220.0 lb

## 2013-06-27 DIAGNOSIS — I6529 Occlusion and stenosis of unspecified carotid artery: Secondary | ICD-10-CM | POA: Insufficient documentation

## 2013-06-27 DIAGNOSIS — Z87891 Personal history of nicotine dependence: Secondary | ICD-10-CM | POA: Diagnosis not present

## 2013-06-27 DIAGNOSIS — E119 Type 2 diabetes mellitus without complications: Secondary | ICD-10-CM | POA: Insufficient documentation

## 2013-06-27 DIAGNOSIS — I739 Peripheral vascular disease, unspecified: Secondary | ICD-10-CM | POA: Diagnosis not present

## 2013-06-27 DIAGNOSIS — I10 Essential (primary) hypertension: Secondary | ICD-10-CM | POA: Diagnosis not present

## 2013-06-27 DIAGNOSIS — I25810 Atherosclerosis of coronary artery bypass graft(s) without angina pectoris: Secondary | ICD-10-CM

## 2013-06-27 DIAGNOSIS — R0609 Other forms of dyspnea: Secondary | ICD-10-CM | POA: Diagnosis not present

## 2013-06-27 DIAGNOSIS — I2510 Atherosclerotic heart disease of native coronary artery without angina pectoris: Secondary | ICD-10-CM

## 2013-06-27 DIAGNOSIS — R0989 Other specified symptoms and signs involving the circulatory and respiratory systems: Secondary | ICD-10-CM | POA: Insufficient documentation

## 2013-06-27 MED ORDER — TECHNETIUM TC 99M SESTAMIBI GENERIC - CARDIOLITE
11.0000 | Freq: Once | INTRAVENOUS | Status: AC | PRN
  Administered 2013-06-27: 11 via INTRAVENOUS

## 2013-06-27 MED ORDER — REGADENOSON 0.4 MG/5ML IV SOLN
0.4000 mg | Freq: Once | INTRAVENOUS | Status: AC
  Administered 2013-06-27: 0.4 mg via INTRAVENOUS

## 2013-06-27 MED ORDER — TECHNETIUM TC 99M SESTAMIBI GENERIC - CARDIOLITE
33.0000 | Freq: Once | INTRAVENOUS | Status: AC | PRN
  Administered 2013-06-27: 33 via INTRAVENOUS

## 2013-06-27 NOTE — Progress Notes (Addendum)
[facility] SITE 3 NUCLEAR MED [address] [place], [place] [address] [phone]    Cardiology Nuclear Med Study  Fernando Walker is a 79 y.o. male     MRN : [id number]     DOB: June 03, 1933  Procedure Date: 06/27/2013  Nuclear Med Background Indication for Stress Test:  Evaluation for Ischemia and Graft Patency History: '95 CABG x 7, 04-22-09 Myocardial Perfusion Study-Abnormal due to inferior thinning and mild inferior ischemia, EF=58%> Cath: Patent Grafts  Cardiac Risk Factors: Carotid Disease, Family History - CAD, History of Smoking, Hypertension, Lipids, NIDDM and PVD  Symptoms:  DOE and SOB   Nuclear Pre-Procedure Caffeine/Decaff Intake:  None > 12 hrs NPO After: 9:00pm   Lungs:  clear O2 Sat: 98% on room air. IV 0.9% NS with Angio Cath:  20g  IV Site: R Antecubital x 1, tolerated well IV Started by:  Irean Hong, RN  Chest Size (in):  44 Cup Size: n/a  Height: 6\' 2"  (1.88 m)  Weight:  220 lb (99.791 kg)  BMI:  Body mass index is 28.23 kg/(m^2). Tech Comments:  No medications today    Nuclear Med Study 1 or 2 day study: 1 day  Stress Test Type:  Treadmill/Lexiscan  Reading MD: [name]  Order Authorizing Provider:  Valera Castle, Verne Carrow, MD  Resting Radionuclide: Technetium 24m Sestamibi  Resting Radionuclide Dose: 11.0 mCi   Stress Radionuclide:  Technetium 23m Sestamibi  Stress Radionuclide Dose: 33.0 mCi           Stress Protocol Rest HR: 51 Stress HR: 98  Rest BP: 110/62 Stress BP: 135/68  Exercise Time (min): n/a METS: n/a   Predicted Max HR: 142 bpm % Max HR: 64.79 bpm Rate Pressure Product: 01027   Dose of Adenosine (mg):  n/a Dose of Lexiscan: 0.4 mg  Dose of Atropine (mg): n/a Dose of Dobutamine: n/a mcg/kg/min (at max HR)  Stress Test Technologist: Milana Na, EMT-P  Nuclear Technologist:  Domenic Polite, CNMT     Rest Procedure:  Myocardial perfusion imaging was performed at rest 45 minutes following the  intravenous administration of Technetium 35m Sestamibi. Rest ECG: Sinus bradycardia  Stress Procedure:  The patient received IV Lexiscan 0.4 mg over 15-seconds with concurrent low level exercise and then Technetium 8m Sestamibi was injected at 30-seconds while the patient continued walking one more minute. This patient was sob and had chest pain with the Lexiscan injection. Quantitative spect images were obtained after a 45-minute delay. Stress ECG: No significant change from baseline ECG  QPS Raw Data Images:  Normal; no motion artifact; normal heart/lung ratio. Stress Images:  There is partial reversibility in this area. Rest Images:  There is decreased uptake in the inferior wall. Subtraction (SDS):  There is scar involving apical inferior, mid inferior, basal inferior and inferoseptal wall with moderate size severe periinfarct ischemia.    Transient Ischemic Dilatation (Normal <1.22):  n/a Lung/Heart Ratio (Normal <0.45):  0.33  Quantitative Gated Spect Images QGS EDV:  93 ml QGS ESV:  48 ml  Impression Exercise Capacity:  Fair exercise capacity. BP Response:  Normal blood pressure response. Clinical Symptoms:  Typical chest pain. ECG Impression:  No significant ST segment change suggestive of ischemia. Comparison with Prior Nuclear Study: No images to compare  Overall Impression:  Intermediate risk stress nuclear study with a prior infarct in the RCA territory with moderate size severe severity periinfarct ischemia (SDS 7). .  LV Ejection Fraction: 49%.  LV Wall  Motion:  Paradoxical septal motion and hypokinesis in the inferior wall.    [name], MD 06/27/2013

## 2013-07-02 ENCOUNTER — Encounter: Payer: Self-pay | Admitting: Cardiovascular Disease

## 2013-07-02 ENCOUNTER — Ambulatory Visit (INDEPENDENT_AMBULATORY_CARE_PROVIDER_SITE_OTHER): Payer: Medicare Other | Admitting: Cardiovascular Disease

## 2013-07-02 VITALS — BP 117/70 | HR 59 | Ht 75.0 in | Wt 222.6 lb

## 2013-07-02 DIAGNOSIS — I2510 Atherosclerotic heart disease of native coronary artery without angina pectoris: Secondary | ICD-10-CM

## 2013-07-02 NOTE — Progress Notes (Signed)
History of Present Illness: 79 yo male with history of CAD, HTN, HLD here today for cardiac follow up. He has been followed in the past by Dr. Daleen Squibb. He underwent 7V CABG in 1995. Last cath 2010 with 7 patents grafts, total proximal occlusion of all native vessels. Carotid artery dopplers October 2013 with mild bilateral disease. He was recently seen by Dr. Daleen Squibb and reported increased dyspnea. Stress myoview 06/27/13 with inferior wall scar with peri-infarct ischemia, intermediate risk study.   He is here today for follow up. He describes dyspnea with moderate exertion. This is mostly unchanged over the last year. No chest pain. He feels well.   Primary Care Physician: Alwyn Ren  Last Lipid Profile:Lipid Panel     Component Value Date/Time   CHOL 173 11/21/2012 1000   TRIG 75.0 11/21/2012 1000   HDL 56.40 11/21/2012 1000   CHOLHDL 3 11/21/2012 1000   VLDL 15.0 11/21/2012 1000   LDLCALC 102* 11/21/2012 1000     Past Medical History  Diagnosis Date  . CAD (coronary artery disease) of artery bypass graft   . Carotid arterial disease     Nonobstructive  . Hypertension   . Dyslipidemia   . Hypertrophy of prostate with urinary obstruction and other lower urinary tract symptoms (LUTS)   . Pneumonia     As a child  . Cataract   . GERD (gastroesophageal reflux disease)   . Hyperlipidemia   . Pneumonia     as child    Past Surgical History  Procedure Laterality Date  . Coronary artery bypass graft  1995    X 7  . Colonoscopy with polypectomy  2004  . Cataracts      both eyes  . Colonoscopy    . Polypectomy      Current Outpatient Prescriptions  Medication Sig Dispense Refill  . aspirin 81 MG tablet Take 81 mg by mouth daily.        . bimatoprost (LUMIGAN) 0.03 % ophthalmic solution 1 drop as directed.        . finasteride (PROSCAR) 5 MG tablet Take 1 tablet (5 mg total) by mouth daily.  90 tablet  3  . simvastatin (ZOCOR) 40 MG tablet 1/2 by mouth daily  45 tablet  3  . trandolapril  (MAVIK) 4 MG tablet Take 1 tablet (4 mg total) by mouth daily.  90 tablet  3   No current facility-administered medications for this visit.    Allergies  Allergen Reactions  . Sulfonamide Derivatives     rash    History   Social History  . Marital Status: Married    Spouse Name: N/A    Number of Children: N/A  . Years of Education: N/A   Occupational History  . Not on file.   Social History Main Topics  . Smoking status: Former Smoker    Quit date: 10/18/1984  . Smokeless tobacco: Not on file  . Alcohol Use: 4.2 oz/week    7 Glasses of wine per week  . Drug Use: No  . Sexual Activity: Not on file   Other Topics Concern  . Not on file   Social History Narrative  . No narrative on file    Family History  Problem Relation Age of Onset  . Stroke Father 74  . Diabetes Neg Hx   . Cancer Neg Hx   . GER disease Neg Hx   . Heart attack Neg Hx   . Colon cancer Neg Hx   .  Esophageal cancer Neg Hx   . Stomach cancer Neg Hx   . Heart disease Neg Hx     Review of Systems:  As stated in the HPI and otherwise negative.   BP 117/70  Pulse 59  Ht 6\' 3"  (1.905 m)  Wt 222 lb 9.6 oz (100.971 kg)  BMI 27.82 kg/m2  Physical Examination: General: Well developed, well nourished, NAD HEENT: OP clear, mucus membranes moist SKIN: warm, dry. No rashes. Neuro: No focal deficits Musculoskeletal: Muscle strength 5/5 all ext Psychiatric: Mood and affect normal Neck: No JVD, no carotid bruits, no thyromegaly, no lymphadenopathy. Lungs:Clear bilaterally, no wheezes, rhonci, crackles Cardiovascular: Regular rate and rhythm. No murmurs, gallops or rubs. Abdomen:Soft. Bowel sounds present. Non-tender.  Extremities: No lower extremity edema. Pulses are 2 + in the bilateral DP/PT.  Stress myoview 06/27/13: Stress Procedure: The patient received IV Lexiscan 0.4 mg over 15-seconds with concurrent low level exercise and then Technetium 46m Sestamibi was injected at 30-seconds while the  patient continued walking one more minute. This patient was sob and had chest pain with the Lexiscan injection. Quantitative spect images were obtained after a 45-minute delay.  Stress ECG: No significant change from baseline ECG  QPS  Raw Data Images: Normal; no motion artifact; normal heart/lung ratio.  Stress Images: There is partial reversibility in this area.  Rest Images: There is decreased uptake in the inferior wall.  Subtraction (SDS): There is scar involving apical inferior, mid inferior, basal inferior and inferoseptal wall with moderate size severe periinfarct ischemia.  Transient Ischemic Dilatation (Normal <1.22): n/a  Lung/Heart Ratio (Normal <0.45): 0.33  Quantitative Gated Spect Images  QGS EDV: 93 ml  QGS ESV: 48 ml  Impression  Exercise Capacity: Fair exercise capacity.  BP Response: Normal blood pressure response.  Clinical Symptoms: Typical chest pain.  ECG Impression: No significant ST segment change suggestive of ischemia.  Comparison with Prior Nuclear Study: No images to compare  Overall Impression: Intermediate risk stress nuclear study with a prior infarct in the RCA territory with moderate size severe severity periinfarct ischemia (SDS 7). .  LV Ejection Fraction: 49%. LV Wall Motion: Paradoxical septal motion and hypokinesis in the inferior wall.   Cardiac cath 05/01/09:  The left main coronary artery was free of significant disease.  The left anterior descending artery was completely occluded after 2  septal perforators in its proximal portion.  The circumflex artery was completely occluded in its midportion. There  is a 90% proximal stenosis. The distal vessel filled faintly via  bridging collaterals.  The right coronary artery was completely occluded near its origin.  The saphenous vein graft to the acute marginal and posterior ascending  branch of the right coronary artery were patent. There was a filling  defect in the mid-to-distal portion of the  graft, which did not appear  to be obstructive. There was also a 30-40% narrowing in midportion of  the graft just after the attachment to the acute marginal branch.  The LIMA graft to LAD was patent and functioned normally.  The sequential graft to the marginal and posterolateral branch of  circumflex artery was patent and functioned normally. There were  minimal luminal irregularities in the graft.  The vein graft to the first and second diagonal branch of the LAD was  patent and functioned normally.  The left ventriculogram performed in the RAO projection showed good wall  motion with no areas of hypokinesis. The estimated fraction was 65%.  HEMODYNAMIC DATA: The aortic pressure  was 100/59 with a mean of 77.  The left ventricular pressure was 100/10.  Assessment and Plan:   1. CAD: Pt with known CAD with prior CABG in 1995. Last cath 2010 with 7 patent grafts. He has minimal dyspnea. No chest pain. Stress myoview is read as an intermediate risk study with inferior wall infarct with peri-infarct ischemia. This is similar to his stress test in 2010 that led to a cardiac cath demonstrating flow in all bypass grafts. He tells me today that he feels well. He does not wish to pursue a cardiac cath. Will continue ASA, statin. I will see him back in 3 months.

## 2013-07-02 NOTE — Patient Instructions (Signed)
Your physician recommends that you schedule a follow-up appointment in:  3 months. --October 05, 2013 at 9:45

## 2013-08-01 ENCOUNTER — Ambulatory Visit (INDEPENDENT_AMBULATORY_CARE_PROVIDER_SITE_OTHER): Payer: Medicare Other

## 2013-08-01 DIAGNOSIS — Z23 Encounter for immunization: Secondary | ICD-10-CM | POA: Diagnosis not present

## 2013-10-02 ENCOUNTER — Other Ambulatory Visit: Payer: Self-pay | Admitting: Internal Medicine

## 2013-10-02 NOTE — Telephone Encounter (Signed)
Last OV 09-13-12 Both medications filled same day #90 with 3  Ok to fill? No upcoming appts.

## 2013-10-03 NOTE — Telephone Encounter (Signed)
Fill both X 90 days but schedule Medicare Wellness within 3 mos

## 2013-10-04 ENCOUNTER — Telehealth: Payer: Self-pay | Admitting: *Deleted

## 2013-10-04 MED ORDER — TRANDOLAPRIL 4 MG PO TABS: ORAL_TABLET | ORAL | Status: DC

## 2013-10-04 MED ORDER — FINASTERIDE 5 MG PO TABS: ORAL_TABLET | ORAL | Status: DC

## 2013-10-04 NOTE — Telephone Encounter (Signed)
Rx's sent to the pharmacy by e-script.  Pt needs to schedule an Medicare Wellness in 3 mos.//AB/CMA

## 2013-10-04 NOTE — Telephone Encounter (Signed)
Message copied by Verdie Shire on Thu Oct 04, 2013 12:23 PM ------      Message from: Lorene Dy L      Created: Wed Oct 03, 2013  8:56 AM                   ----- Message -----         From: Pecola Lawless, MD         Sent: 10/02/2013   6:15 PM           To: Jackson Latino, CMA            Refill #90 of each but schedule Medicare Wellness in next 3 mos ------

## 2013-10-04 NOTE — Telephone Encounter (Signed)
Rx's sent to the pharmacy by e-script.  Pt needs to schedule an Medicare Wellness in 3 mos.//AB/CMA 

## 2013-10-05 ENCOUNTER — Ambulatory Visit (INDEPENDENT_AMBULATORY_CARE_PROVIDER_SITE_OTHER): Payer: Medicare Other | Admitting: Cardiovascular Disease

## 2013-10-05 ENCOUNTER — Encounter: Payer: Self-pay | Admitting: Cardiovascular Disease

## 2013-10-05 VITALS — BP 160/78 | HR 64 | Ht 75.0 in | Wt 224.0 lb

## 2013-10-05 DIAGNOSIS — E785 Hyperlipidemia, unspecified: Secondary | ICD-10-CM

## 2013-10-05 DIAGNOSIS — I779 Disorder of arteries and arterioles, unspecified: Secondary | ICD-10-CM | POA: Diagnosis not present

## 2013-10-05 DIAGNOSIS — I2510 Atherosclerotic heart disease of native coronary artery without angina pectoris: Secondary | ICD-10-CM

## 2013-10-05 DIAGNOSIS — I10 Essential (primary) hypertension: Secondary | ICD-10-CM

## 2013-10-05 NOTE — Patient Instructions (Signed)
Your physician wants you to follow-up in:  12 months.  You will receive a reminder letter in the mail two months in advance. If you don't receive a letter, please call our office to schedule the follow-up appointment.   

## 2013-10-05 NOTE — Progress Notes (Signed)
History of Present Illness: 79 yo male with history of CAD, HTN, HLD here today for cardiac follow up. He has been followed in the past by Dr. Daleen Squibb. He underwent 7V CABG in 1995. Last cath 2010 with 7 patents grafts, total proximal occlusion of all native vessels. Carotid artery dopplers October 2013 with mild bilateral disease. Stress myoview 06/27/13 in setting of dyspnea with inferior wall scar with peri-infarct ischemia, intermediate risk study. I saw him in f/u and he stated that he did not have chest pain. His dyspnea was stable. He did not wish to pursue a cardiac cath.   He is here today for follow up. He describes fatigue with exertion. No chest pain or SOB. He feels well. His BP is controlled at home. Always less than 140 systolically.   Primary Care Physician: Alwyn Ren  Last Lipid Profile:Lipid Panel     Component Value Date/Time   CHOL 173 11/21/2012 1000   TRIG 75.0 11/21/2012 1000   HDL 56.40 11/21/2012 1000   CHOLHDL 3 11/21/2012 1000   VLDL 15.0 11/21/2012 1000   LDLCALC 102* 11/21/2012 1000    Past Medical History  Diagnosis Date  . CAD (coronary artery disease) of artery bypass graft   . Carotid arterial disease     Nonobstructive  . Hypertension   . Dyslipidemia   . Hypertrophy of prostate with urinary obstruction and other lower urinary tract symptoms (LUTS)   . Pneumonia     As a child  . Cataract   . GERD (gastroesophageal reflux disease)   . Hyperlipidemia   . Pneumonia     as child    Past Surgical History  Procedure Laterality Date  . Coronary artery bypass graft  1995    X 7  . Colonoscopy with polypectomy  2004  . Cataracts      both eyes  . Colonoscopy    . Polypectomy      Current Outpatient Prescriptions  Medication Sig Dispense Refill  . aspirin 81 MG tablet Take 81 mg by mouth daily.        . bimatoprost (LUMIGAN) 0.03 % ophthalmic solution 1 drop as directed.        . finasteride (PROSCAR) 5 MG tablet PT NEEDS COMPLETE PHYSICAL.TAKE 1 TABLET  EVERY DAY.  90 tablet  0  . simvastatin (ZOCOR) 40 MG tablet 1/2 by mouth daily  45 tablet  3  . trandolapril (MAVIK) 4 MG tablet PT NEEDS COMPLETE PHYSICAL.TAKE 1 TABLET EVERY DAY.  90 tablet  0   No current facility-administered medications for this visit.    Allergies  Allergen Reactions  . Sulfonamide Derivatives     rash    History   Social History  . Marital Status: Married    Spouse Name: N/A    Number of Children: N/A  . Years of Education: N/A   Occupational History  . Not on file.   Social History Main Topics  . Smoking status: Former Smoker    Quit date: 10/18/1984  . Smokeless tobacco: Not on file  . Alcohol Use: 4.2 oz/week    7 Glasses of wine per week  . Drug Use: No  . Sexual Activity: Not on file   Other Topics Concern  . Not on file   Social History Narrative  . No narrative on file    Family History  Problem Relation Age of Onset  . Stroke Father 38  . Diabetes Neg Hx   . Cancer Neg Hx   .  GER disease Neg Hx   . Heart attack Neg Hx   . Colon cancer Neg Hx   . Esophageal cancer Neg Hx   . Stomach cancer Neg Hx   . Heart disease Neg Hx     Review of Systems:  As stated in the HPI and otherwise negative.   BP 160/78  Pulse 64  Ht 6\' 3"  (1.905 m)  Wt 224 lb (101.606 kg)  BMI 28.00 kg/m2  Physical Examination: General: Well developed, well nourished, NAD HEENT: OP clear, mucus membranes moist SKIN: warm, dry. No rashes. Neuro: No focal deficits Musculoskeletal: Muscle strength 5/5 all ext Psychiatric: Mood and affect normal Neck: No JVD, no carotid bruits, no thyromegaly, no lymphadenopathy. Lungs:Clear bilaterally, no wheezes, rhonci, crackles Cardiovascular: Regular rate and rhythm. No murmurs, gallops or rubs. Abdomen:Soft. Bowel sounds present. Non-tender.  Extremities: No lower extremity edema. Pulses are 2 + in the bilateral DP/PT.  Stress myoview 06/27/13: Stress Procedure: The patient received IV Lexiscan 0.4 mg over  15-seconds with concurrent low level exercise and then Technetium 76m Sestamibi was injected at 30-seconds while the patient continued walking one more minute. This patient was sob and had chest pain with the Lexiscan injection. Quantitative spect images were obtained after a 45-minute delay.  Stress ECG: No significant change from baseline ECG  QPS  Raw Data Images: Normal; no motion artifact; normal heart/lung ratio.  Stress Images: There is partial reversibility in this area.  Rest Images: There is decreased uptake in the inferior wall.  Subtraction (SDS): There is scar involving apical inferior, mid inferior, basal inferior and inferoseptal wall with moderate size severe periinfarct ischemia.  Transient Ischemic Dilatation (Normal <1.22): n/a  Lung/Heart Ratio (Normal <0.45): 0.33  Quantitative Gated Spect Images  QGS EDV: 93 ml  QGS ESV: 48 ml  Impression  Exercise Capacity: Fair exercise capacity.  BP Response: Normal blood pressure response.  Clinical Symptoms: Typical chest pain.  ECG Impression: No significant ST segment change suggestive of ischemia.  Comparison with Prior Nuclear Study: No images to compare  Overall Impression: Intermediate risk stress nuclear study with a prior infarct in the RCA territory with moderate size severe severity periinfarct ischemia (SDS 7). .  LV Ejection Fraction: 49%. LV Wall Motion: Paradoxical septal motion and hypokinesis in the inferior wall.   Cardiac cath 05/01/09:  The left main coronary artery was free of significant disease.  The left anterior descending artery was completely occluded after 2  septal perforators in its proximal portion.  The circumflex artery was completely occluded in its midportion. There  is a 90% proximal stenosis. The distal vessel filled faintly via  bridging collaterals.  The right coronary artery was completely occluded near its origin.  The saphenous vein graft to the acute marginal and posterior ascending    branch of the right coronary artery were patent. There was a filling  defect in the mid-to-distal portion of the graft, which did not appear  to be obstructive. There was also a 30-40% narrowing in midportion of  the graft just after the attachment to the acute marginal branch.  The LIMA graft to LAD was patent and functioned normally.  The sequential graft to the marginal and posterolateral branch of  circumflex artery was patent and functioned normally. There were  minimal luminal irregularities in the graft.  The vein graft to the first and second diagonal branch of the LAD was  patent and functioned normally.  The left ventriculogram performed in the RAO projection showed  good wall  motion with no areas of hypokinesis. The estimated fraction was 65%.  HEMODYNAMIC DATA: The aortic pressure was 100/59 with a mean of 77.  The left ventricular pressure was 100/10.  Assessment and Plan:   1. CAD: Pt with known CAD with prior CABG in 1995. Last cath 2010 with 7 patent grafts. Stress myoview September 2014 is read as an intermediate risk study with inferior wall infarct with peri-infarct ischemia. This is similar to his stress test in 2010 that led to a cardiac cath demonstrating flow in all bypass grafts. He tells me today that he feels well. Will continue ASA, statin. I will see him back in one year.   2. HTN: BP elevated today. Has been well controlled at home. He will follow at home.   3. Hyperlipidemia: He is on a statin and tolerating well. Lipids are well controlled.   4. Carotid artery disease: Mild carotid disease by dopplers October 2013. Will arrange repeat at f/u in December 2015.

## 2013-11-12 ENCOUNTER — Encounter: Payer: Self-pay | Admitting: Internal Medicine

## 2013-11-12 ENCOUNTER — Ambulatory Visit (INDEPENDENT_AMBULATORY_CARE_PROVIDER_SITE_OTHER): Payer: Medicare Other | Admitting: Internal Medicine

## 2013-11-12 VITALS — BP 147/71 | HR 63 | Temp 98.3°F | Wt 225.6 lb

## 2013-11-12 DIAGNOSIS — Z125 Encounter for screening for malignant neoplasm of prostate: Secondary | ICD-10-CM

## 2013-11-12 DIAGNOSIS — I10 Essential (primary) hypertension: Secondary | ICD-10-CM | POA: Diagnosis not present

## 2013-11-12 DIAGNOSIS — N401 Enlarged prostate with lower urinary tract symptoms: Secondary | ICD-10-CM

## 2013-11-12 DIAGNOSIS — I25810 Atherosclerosis of coronary artery bypass graft(s) without angina pectoris: Secondary | ICD-10-CM | POA: Diagnosis not present

## 2013-11-12 DIAGNOSIS — Z860101 Personal history of adenomatous and serrated colon polyps: Secondary | ICD-10-CM

## 2013-11-12 DIAGNOSIS — N138 Other obstructive and reflux uropathy: Secondary | ICD-10-CM

## 2013-11-12 DIAGNOSIS — E785 Hyperlipidemia, unspecified: Secondary | ICD-10-CM | POA: Diagnosis not present

## 2013-11-12 DIAGNOSIS — Z86010 Personal history of colonic polyps: Secondary | ICD-10-CM | POA: Diagnosis not present

## 2013-11-12 LAB — HEPATIC FUNCTION PANEL
ALBUMIN: 3.9 g/dL (ref 3.5–5.2)
ALT: 22 U/L (ref 0–53)
AST: 21 U/L (ref 0–37)
Alkaline Phosphatase: 39 U/L (ref 39–117)
BILIRUBIN DIRECT: 0.1 mg/dL (ref 0.0–0.3)
TOTAL PROTEIN: 7 g/dL (ref 6.0–8.3)
Total Bilirubin: 1.5 mg/dL — ABNORMAL HIGH (ref 0.3–1.2)

## 2013-11-12 LAB — LIPID PANEL
CHOL/HDL RATIO: 3
Cholesterol: 171 mg/dL (ref 0–200)
HDL: 58.8 mg/dL (ref >39.00)
LDL CALC: 92 mg/dL (ref 0–99)
TRIGLYCERIDES: 101 mg/dL (ref 0.0–149.0)
VLDL: 20.2 mg/dL (ref 0.0–40.0)

## 2013-11-12 LAB — BASIC METABOLIC PANEL
BUN: 14 mg/dL (ref 6–23)
CALCIUM: 9.3 mg/dL (ref 8.4–10.5)
CO2: 27 meq/L (ref 19–32)
Chloride: 104 mEq/L (ref 96–112)
Creatinine, Ser: 1.3 mg/dL (ref 0.4–1.5)
GFR: 59.26 mL/min — ABNORMAL LOW (ref >60.00)
GLUCOSE: 113 mg/dL — AB (ref 70–99)
POTASSIUM: 4.1 meq/L (ref 3.5–5.1)
Sodium: 138 mEq/L (ref 135–145)

## 2013-11-12 LAB — CK: Total CK: 73 U/L (ref 7–232)

## 2013-11-12 NOTE — Assessment & Plan Note (Signed)
PSA

## 2013-11-12 NOTE — Progress Notes (Signed)
Pre visit review using our clinic review tool, if applicable. No additional management support is needed unless otherwise documented below in the visit note. 

## 2013-11-12 NOTE — Progress Notes (Signed)
   Subjective:    Patient ID: Fernando Walker, male    DOB: 09-15-1933, 80 y.o.   MRN: [id number]  HPI A modified heart healthy diet is followed; no exercise program but physically active 4 days /week as yardwork, home repair without symptoms fatiguabilty.  Family history is negative for premature coronary disease. No advanced cholesterol testing to date. There is medication compliance with the statin.  Low dose ASA taken  Blood pressure range / average 135-155/70-100 (not quite sure of diastolic BP) Compliant with anti hypertemsive medication. No lightheadedness or other adverse medication effect described.          Review of Systems  He has had chest pain when fatigued. No palpitations, dyspnea,  paroxysmal nocturnal dyspnea, edema or claudication.  Significant abdominal symptoms, memory deficit, or myalgias not present. Significant headaches or epistaxis denied. Nocturia 2 X/ night.    Objective:   Physical Exam Appears healthy and well-nourished & in no acute distress.Appears younger than stated age  No carotid bruits are present.No neck pain distention present at 10 - 15 degrees.  Thyroid normal to palpation  Heart rhythm and rate are normal .Grade 1/6 systolic murmur. Faint R carotid bruit  Chest is clear with no increased work of breathing  There is no evidence of aortic aneurysm or renal artery bruits  Abdomen soft with no organomegaly or masses. No HJR  No clubbing, cyanosis or edema present.  Pedal pulses are intact   No ischemic skin changes are present . Nails healthy    Alert and oriented. Strength, tone, DTRs reflexes normal          Assessment & Plan:  See Current Assessment & Plan in Problem List under specific Diagnosis

## 2013-11-12 NOTE — Assessment & Plan Note (Addendum)
LFT,lipids,CK,TSH 

## 2013-11-12 NOTE — Patient Instructions (Signed)
Your next office appointment will be determined based upon review of your pending labs . Those instructions will be transmitted to you through My Chart  or by mail if you're not using this system.   Minimal Blood Pressure Goal= AVERAGE < 140/90;  Ideal is an AVERAGE < 135/85. This AVERAGE should be calculated from @ least 5-7 BP readings taken @ different times of day on different days of week. You should not respond to isolated BP readings , but rather the AVERAGE for that week .Please bring your  blood pressure cuff to office visits to verify that it is reliable.It  can also be checked against the blood pressure device at the pharmacy. Finger or wrist cuffs are not dependable; an arm cuff is. 

## 2013-11-12 NOTE — Assessment & Plan Note (Signed)
BMET  Goals discussed 

## 2013-11-13 LAB — PSA: PSA: 0.21 ng/mL (ref 0.10–4.00)

## 2013-11-13 LAB — TSH: TSH: 1.98 u[IU]/mL (ref 0.35–5.50)

## 2013-11-21 ENCOUNTER — Telehealth: Payer: Self-pay | Admitting: Internal Medicine

## 2013-11-21 NOTE — Telephone Encounter (Signed)
Relevant patient education assigned to patient using Emmi. ° °

## 2014-01-11 ENCOUNTER — Other Ambulatory Visit: Payer: Self-pay

## 2014-01-11 MED ORDER — SIMVASTATIN 40 MG PO TABS: ORAL_TABLET | ORAL | Status: DC

## 2014-01-11 MED ORDER — SIMVASTATIN 40 MG PO TABS
ORAL_TABLET | ORAL | Status: DC
Start: 2014-01-11 — End: 2014-01-11

## 2014-01-11 NOTE — Telephone Encounter (Signed)
Refilled rx for simvastatin 40 mg

## 2014-01-11 NOTE — Telephone Encounter (Signed)
rx refilled simvastatin 40 mg

## 2014-04-09 ENCOUNTER — Other Ambulatory Visit: Payer: Self-pay

## 2014-04-09 MED ORDER — FINASTERIDE 5 MG PO TABS
ORAL_TABLET | ORAL | Status: DC
Start: 2014-04-09 — End: 2014-06-26

## 2014-05-22 ENCOUNTER — Ambulatory Visit (INDEPENDENT_AMBULATORY_CARE_PROVIDER_SITE_OTHER): Payer: Medicare Other | Admitting: Emergency Medicine

## 2014-05-22 VITALS — BP 132/78 | HR 56 | Temp 97.8°F | Resp 18 | Ht 73.0 in | Wt 219.0 lb

## 2014-05-22 DIAGNOSIS — W57XXXA Bitten or stung by nonvenomous insect and other nonvenomous arthropods, initial encounter: Secondary | ICD-10-CM

## 2014-05-22 DIAGNOSIS — T148 Other injury of unspecified body region: Secondary | ICD-10-CM | POA: Diagnosis not present

## 2014-05-22 DIAGNOSIS — I25810 Atherosclerosis of coronary artery bypass graft(s) without angina pectoris: Secondary | ICD-10-CM | POA: Diagnosis not present

## 2014-05-22 MED ORDER — MUPIROCIN 2 % EX OINT: TOPICAL_OINTMENT | CUTANEOUS | Status: DC

## 2014-05-22 NOTE — Progress Notes (Signed)
   Subjective:  This chart was scribed for Arlyss Queen, MD by Terressa Koyanagi, ED Scribe. This patient was seen in room 5 and the patient's care was started at 1:15 PM.       Patient ID: Fernando Walker, male    DOB: [birth year], 80 y.o.   MRN: [id number]  HPI HPI Comments: HPI Comments: Fernando Walker is a 80 y.o. male who presents to the [facility] complaining of a tick bite to the left lower leg one month ago. Pt reports that the tick was attached but pt was able to remove it with tweezers. Pt reports that for the past few days a knot developed at the bite site and it started to itch. Pt denies any other Sx, including fever or chills.    Past Medical History  Diagnosis Date  . CAD (coronary artery disease) of artery bypass graft   . Carotid arterial disease     Nonobstructive  . Hypertension   . Dyslipidemia   . Hypertrophy of prostate with urinary obstruction and other lower urinary tract symptoms (LUTS)   . Pneumonia     As a child  . Cataract   . GERD (gastroesophageal reflux disease)   . Hyperlipidemia   . Pneumonia     as child    Review of Systems  Constitutional: Negative for chills, fatigue and unexpected weight change.  Eyes: Negative for visual disturbance.  Respiratory: Negative for cough, chest tightness and shortness of breath.   Cardiovascular: Negative for chest pain, palpitations and leg swelling.  Gastrointestinal: Negative for abdominal pain and blood in stool.  Skin:       Small red bump and knot at bite site.   Neurological: Negative for dizziness, light-headedness and headaches.  Psychiatric/Behavioral: Negative for confusion.      Objective:   Physical Exam CONSTITUTIONAL: Well developed/well nourished HEAD: Normocephalic/atraumatic EYES: EOMI/PERRL ENMT: Mucous membranes moist NECK: supple no meningeal signs SPINE:entire spine nontender CV: S1/S2 noted, no murmurs/rubs/gallops noted LUNGS: Lungs are clear to  auscultation bilaterally, no apparent distress ABDOMEN: soft, nontender, no rebound or guarding GU:no cva tenderness NEURO: Pt is awake/alert, moves all extremitiesx4 EXTREMITIES: pulses normal, full ROM SKIN: warm, color normal, 1x1 cm raised area with 58mm raised black area in the left anterior shin PSYCH: no abnormalities of mood noted   procedure note. 20-gauge needle used and 2 black foreign body like pieces were removed. They measure approximately 1-2 mm. Assessment & Plan:  DIAGNOSTIC STUDIES: Oxygen Saturation is 96% on RA, adequate by my interpretation.    COORDINATION OF CARE: 1:18 PM-Discussed treatment plan which includes lancing bite site and topical antibiotic cream with pt at bedside and pt agreed to plan.   They will keep the area clean with soap and water followed by application of Bactroban ointment to  I personally performed the services described in this documentation, which was scribed in my presence. The recorded information has been reviewed and is accurate.

## 2014-06-26 ENCOUNTER — Other Ambulatory Visit (INDEPENDENT_AMBULATORY_CARE_PROVIDER_SITE_OTHER): Payer: Medicare Other

## 2014-06-26 ENCOUNTER — Ambulatory Visit (INDEPENDENT_AMBULATORY_CARE_PROVIDER_SITE_OTHER): Payer: Medicare Other | Admitting: Internal Medicine

## 2014-06-26 ENCOUNTER — Encounter: Payer: Self-pay | Admitting: Internal Medicine

## 2014-06-26 VITALS — BP 128/66 | HR 62 | Temp 98.0°F | Wt 218.4 lb

## 2014-06-26 DIAGNOSIS — I10 Essential (primary) hypertension: Secondary | ICD-10-CM

## 2014-06-26 DIAGNOSIS — I25810 Atherosclerosis of coronary artery bypass graft(s) without angina pectoris: Secondary | ICD-10-CM

## 2014-06-26 DIAGNOSIS — R7309 Other abnormal glucose: Secondary | ICD-10-CM

## 2014-06-26 DIAGNOSIS — Z23 Encounter for immunization: Secondary | ICD-10-CM

## 2014-06-26 DIAGNOSIS — N400 Enlarged prostate without lower urinary tract symptoms: Secondary | ICD-10-CM

## 2014-06-26 DIAGNOSIS — E785 Hyperlipidemia, unspecified: Secondary | ICD-10-CM | POA: Diagnosis not present

## 2014-06-26 LAB — BASIC METABOLIC PANEL
BUN: 15 mg/dL (ref 6–23)
CHLORIDE: 102 meq/L (ref 96–112)
CO2: 29 mEq/L (ref 19–32)
Calcium: 9.3 mg/dL (ref 8.4–10.5)
Creatinine, Ser: 1.2 mg/dL (ref 0.4–1.5)
GFR: 60.84 mL/min (ref >60.00)
Glucose, Bld: 117 mg/dL — ABNORMAL HIGH (ref 70–99)
POTASSIUM: 4.3 meq/L (ref 3.5–5.1)
Sodium: 138 mEq/L (ref 135–145)

## 2014-06-26 LAB — HEMOGLOBIN A1C: HEMOGLOBIN A1C: 6.2 % (ref 4.6–6.5)

## 2014-06-26 LAB — MICROALBUMIN / CREATININE URINE RATIO
Creatinine,U: 124.5 mg/dL
MICROALB UR: 0.2 mg/dL (ref 0.0–1.9)
MICROALB/CREAT RATIO: 0.2 mg/g (ref 0.0–30.0)

## 2014-06-26 MED ORDER — SIMVASTATIN 40 MG PO TABS: 20.0000 mg | ORAL_TABLET | Freq: Every day | ORAL | Status: DC

## 2014-06-26 MED ORDER — TRANDOLAPRIL 4 MG PO TABS: ORAL_TABLET | ORAL | Status: DC

## 2014-06-26 NOTE — Assessment & Plan Note (Signed)
A1c , urine microalbumin, BMET 

## 2014-06-26 NOTE — Assessment & Plan Note (Signed)
Blood pressure goals reviewed. BMET 

## 2014-06-26 NOTE — Progress Notes (Signed)
Subjective:    Patient ID: Fernando Walker, male    DOB: 1933/08/22, 80 y.o.   MRN: [id number]  HPI    He is here for followup of his hypertension, dyslipidemia, and elevated glucose.  He has been compliant with medications and denies any adverse effects .  Blood pressure range 120-140s over 80-100s.  His most recent labs were in January of this year. At that time GFR was 59.26; total bilirubin 1.5; and nonfasting glucose 113. His last A1c on record was in November 2013 with a value of 6.1%    Review of Systems    He describes chronic exertional dyspnea which is stable; this has been present for years.  He also has stable nocturia up to 2 times per night. He is inquiring about stopping the Proscar as he has no other significant lower urinary tract symptoms.  He was bitten by a tick in June and has some residual itching and redness. He has no constitutional symptoms of fever, chills, sweats. He denies any associated headaches or rash.  All below NEGATIVE: Chest pain, palpitations       Edema Claudication  Lightheadedness,Syncope Weight change Polyuria/phagia/dipsia    Blurred vision /diplopia/lossof vision Limb numbness/tingling/burning Non healing skin lesions Abd pain, bowel changes   Myalgias      Objective:   Physical Exam    Positive or pertinent findings include: Pattern alopecia is present; he has a goatee. He wears hearing aids bilaterally but still has decreased auditory acuity. A grade 1/2 systolic murmur is present left sternal border. Breath sounds are decreased; he has no abnormal breath sounds or increased work of breathing. Posterior tibial pulses are equal but decreased. He has a tiny eschar over the left mid shin. There is no evidence of cellulitis.  Gen.: Healthy and well-nourished in appearance. Alert, appropriate and cooperative throughout exam. Appears younger than stated age  Head: Normocephalic without obvious abnormalities  Eyes: No  corneal or conjunctival inflammation noted. Pupils equal round reactive to light and accommodation. Extraocular motion intact.  Nose: External nasal exam reveals no deformity or inflammation. Nasal mucosa are pink and moist. No lesions or exudates noted.   Mouth: Oral mucosa and oropharynx reveal no lesions or exudates. Teeth in good repair. Neck: No deformities, masses, or tenderness noted. Range of motion & Thyroid normal Lungs: Normal respiratory effort; chest expands symmetrically.  Heart: Normal rate and rhythm. Normal S1 and S2. No gallop, click, or rub.  Abdomen: Bowel sounds normal; abdomen soft and nontender. No masses, organomegaly or hernias noted.                              Musculoskeletal/extremities: No deformity or scoliosis noted of  the thoracic or lumbar spine.  No clubbing, cyanosis, edema, or significant extremity  deformity noted. Range of motion normal .Tone & strength normal. Hand joints normal  Fingernail  health good. Able to lie down & sit up w/o help. Negative SLR bilaterally Vascular: Carotid, radial artery, dorsalis pedis and  posterior tibial pulses are equal. No bruits present. Neurologic: Alert and oriented x3. Deep tendon reflexes symmetrical and normal.  Gait normal .       Skin: Intact without suspicious lesions or rashes. Lymph: No cervical, axillary lymphadenopathy present. Psych: Mood and affect are normal. Normally interactive  Assessment & Plan:  See Current Assessment & Plan in Problem List under specific Diagnosis

## 2014-06-26 NOTE — Patient Instructions (Addendum)
Minimal Blood Pressure Goal= AVERAGE < 140/90;  Ideal is an AVERAGE < 135/85. This AVERAGE should be calculated from @ least 5-7 BP readings taken @ different times of day on different days of week. You should not respond to isolated BP readings , but rather the AVERAGE for that week .Please bring your  blood pressure cuff to office visits to verify that it is reliable.It  can also be checked against the blood pressure device at the pharmacy. Finger or wrist cuffs are not dependable; an arm cuff is.  Your next office appointment will be determined based upon review of your pending labs. Those instructions will be transmitted to you through My Chart.  Apply Cort Aid OTC twice a day to the involved tissues for itching as needed

## 2014-06-26 NOTE — Assessment & Plan Note (Addendum)
PSA 0.21 on Proscar He requests trial off Proscar

## 2014-06-26 NOTE — Progress Notes (Signed)
Pre visit review using our clinic review tool, if applicable. No additional management support is needed unless otherwise documented below in the visit note. 

## 2014-06-26 NOTE — Assessment & Plan Note (Signed)
Lipids @ goal 10/2013; due 10/2014

## 2014-07-30 ENCOUNTER — Ambulatory Visit (HOSPITAL_COMMUNITY): Payer: Medicare Other

## 2014-07-31 ENCOUNTER — Encounter: Payer: Self-pay | Admitting: Gastroenterology

## 2014-08-01 ENCOUNTER — Other Ambulatory Visit (HOSPITAL_COMMUNITY): Payer: Self-pay | Admitting: *Deleted

## 2014-08-01 DIAGNOSIS — I6523 Occlusion and stenosis of bilateral carotid arteries: Secondary | ICD-10-CM

## 2014-08-16 ENCOUNTER — Ambulatory Visit (HOSPITAL_COMMUNITY): Payer: Medicare Other | Attending: Cardiovascular Disease | Admitting: *Deleted

## 2014-08-16 DIAGNOSIS — Z951 Presence of aortocoronary bypass graft: Secondary | ICD-10-CM | POA: Insufficient documentation

## 2014-08-16 DIAGNOSIS — E785 Hyperlipidemia, unspecified: Secondary | ICD-10-CM | POA: Insufficient documentation

## 2014-08-16 DIAGNOSIS — Z87891 Personal history of nicotine dependence: Secondary | ICD-10-CM | POA: Diagnosis not present

## 2014-08-16 DIAGNOSIS — I2510 Atherosclerotic heart disease of native coronary artery without angina pectoris: Secondary | ICD-10-CM | POA: Insufficient documentation

## 2014-08-16 DIAGNOSIS — I6523 Occlusion and stenosis of bilateral carotid arteries: Secondary | ICD-10-CM | POA: Diagnosis not present

## 2014-08-16 DIAGNOSIS — E119 Type 2 diabetes mellitus without complications: Secondary | ICD-10-CM | POA: Diagnosis not present

## 2014-08-16 DIAGNOSIS — I10 Essential (primary) hypertension: Secondary | ICD-10-CM | POA: Insufficient documentation

## 2014-08-16 NOTE — Progress Notes (Signed)
Carotid duplex performed 

## 2014-08-21 DIAGNOSIS — D225 Melanocytic nevi of trunk: Secondary | ICD-10-CM | POA: Diagnosis not present

## 2014-08-21 DIAGNOSIS — L820 Inflamed seborrheic keratosis: Secondary | ICD-10-CM | POA: Diagnosis not present

## 2014-08-21 DIAGNOSIS — T63484A Toxic effect of venom of other arthropod, undetermined, initial encounter: Secondary | ICD-10-CM | POA: Diagnosis not present

## 2014-08-26 DIAGNOSIS — Z961 Presence of intraocular lens: Secondary | ICD-10-CM | POA: Diagnosis not present

## 2014-09-04 DIAGNOSIS — S40861A Insect bite (nonvenomous) of right upper arm, initial encounter: Secondary | ICD-10-CM | POA: Diagnosis not present

## 2014-09-04 DIAGNOSIS — L309 Dermatitis, unspecified: Secondary | ICD-10-CM | POA: Diagnosis not present

## 2014-09-04 DIAGNOSIS — S80862A Insect bite (nonvenomous), left lower leg, initial encounter: Secondary | ICD-10-CM | POA: Diagnosis not present

## 2014-09-04 DIAGNOSIS — L308 Other specified dermatitis: Secondary | ICD-10-CM | POA: Diagnosis not present

## 2014-09-18 DIAGNOSIS — B880 Other acariasis: Secondary | ICD-10-CM | POA: Diagnosis not present

## 2014-09-24 ENCOUNTER — Encounter: Payer: Self-pay | Admitting: Internal Medicine

## 2014-09-24 ENCOUNTER — Ambulatory Visit (INDEPENDENT_AMBULATORY_CARE_PROVIDER_SITE_OTHER): Payer: Medicare Other | Admitting: Internal Medicine

## 2014-09-24 VITALS — BP 120/62 | HR 70 | Temp 97.7°F | Wt 222.2 lb

## 2014-09-24 DIAGNOSIS — R238 Other skin changes: Secondary | ICD-10-CM

## 2014-09-24 DIAGNOSIS — R194 Change in bowel habit: Secondary | ICD-10-CM | POA: Diagnosis not present

## 2014-09-24 DIAGNOSIS — I25810 Atherosclerosis of coronary artery bypass graft(s) without angina pectoris: Secondary | ICD-10-CM | POA: Diagnosis not present

## 2014-09-24 DIAGNOSIS — L299 Pruritus, unspecified: Secondary | ICD-10-CM | POA: Diagnosis not present

## 2014-09-24 DIAGNOSIS — L989 Disorder of the skin and subcutaneous tissue, unspecified: Secondary | ICD-10-CM | POA: Diagnosis not present

## 2014-09-24 MED ORDER — RANITIDINE HCL 150 MG PO TABS: 150.0000 mg | ORAL_TABLET | Freq: Two times a day (BID) | ORAL | Status: DC

## 2014-09-24 MED ORDER — HYDROXYZINE HCL 10 MG PO TABS: 10.0000 mg | ORAL_TABLET | Freq: Three times a day (TID) | ORAL | Status: DC | PRN

## 2014-09-24 NOTE — Progress Notes (Signed)
   Subjective:    Patient ID: Fernando Walker, male    DOB: Nov 30, 1932, 80 y.o.   MRN: [id number]  HPI   He has had a lesion over the left shin since June which has not healed.He believes was related to a tick bite.  Since September he's had diffuse papules which he assumed were mosquito bites.  He saw the Dermatologist on 3 separate occasions. The shin lesion and a right antecubital lesion were biopsied. The only consensus he was given was that there was no evidence of cancer.  The dermatologist felt that these were chigger bites that he had gotten from being in the woods.  He apparently walks through the woods to visits a neighbor and works in the woods occasionally as well  He has squeezed some of the lesions over the breast area which were intensely pruritic. This produced clear sticky material. There is no discharge from the areolae.  He has occasional loose to watery stool which he relates to foods such as pizza or [place] food ingestion.  He has no constitutional symptoms.   Review of Systems Extrinsic symptoms of itchy, watery eyes, sneezing, or angioedema are denied. There is no significant cough, sputum production, wheezing,or  paroxysmal nocturnal dyspnea. No associated itchy, watery eyes.  Fever ,chills , or sweats denied. Purulence absent.      Objective:   Physical Exam   Positive or pertinent findings include: Pattern alopecia is present. He has a Engineering geologist. He has bilateral hearing aids. He has very mild fibrocystic breast changes without any pathologic lesions Abdomen is protuberant He has diffuse papules some of which exhibit excoriations. There is no associated cellulitis or purulence.  General appearance :adequately nourished; in no distress. Eyes: No conjunctival inflammation or scleral icterus is present. Oral exam: Dental hygiene is good. Lips and gums are healthy appearing.There is no oropharyngeal erythema or exudate noted.  Heart:  Normal rate and  regular rhythm. S1 and S2 normal without gallop, murmur, click, rub or other extra sounds   Lungs:Chest clear to auscultation; no wheezes, rhonchi,rales ,or rubs present.No increased work of breathing.  Abdomen: bowel sounds normal, soft and non-tender without masses, organomegaly or hernias noted.  No guarding or rebound.  Vascular : all pulses equal ; no bruits present. Skin:Warm & dry without  jaundice or tenting Lymphatic: No lymphadenopathy is noted about the head, neck, axilla          Assessment & Plan:  #1 diffuse papular dermatitis, probable chigger bites  #2 bowel changes suggesting IBS  Plan: See orders and recommendations

## 2014-09-24 NOTE — Patient Instructions (Addendum)
  Please paint the papules which are itchy with clear fingernail polish. Take the medication for itching at home first to make sure it does not cause excessive sleepiness. Please stay out of the woods as we discussed until the skin lesions have resolved.  Please take a probiotic , Florastor OR Align, every day if the bowels are loose. This will replace the normal bacteria which  are necessary for formation of normal stool and processing of food.

## 2014-09-24 NOTE — Progress Notes (Signed)
Pre visit review using our clinic review tool, if applicable. No additional management support is needed unless otherwise documented below in the visit note. 

## 2014-09-27 ENCOUNTER — Encounter: Payer: Self-pay | Admitting: Cardiovascular Disease

## 2014-09-27 ENCOUNTER — Ambulatory Visit (INDEPENDENT_AMBULATORY_CARE_PROVIDER_SITE_OTHER): Payer: Medicare Other | Admitting: Cardiovascular Disease

## 2014-09-27 VITALS — BP 130/62 | HR 66 | Ht 73.0 in | Wt 220.4 lb

## 2014-09-27 DIAGNOSIS — E785 Hyperlipidemia, unspecified: Secondary | ICD-10-CM

## 2014-09-27 DIAGNOSIS — I25810 Atherosclerosis of coronary artery bypass graft(s) without angina pectoris: Secondary | ICD-10-CM | POA: Diagnosis not present

## 2014-09-27 DIAGNOSIS — I2510 Atherosclerotic heart disease of native coronary artery without angina pectoris: Secondary | ICD-10-CM | POA: Diagnosis not present

## 2014-09-27 DIAGNOSIS — I779 Disorder of arteries and arterioles, unspecified: Secondary | ICD-10-CM

## 2014-09-27 DIAGNOSIS — I10 Essential (primary) hypertension: Secondary | ICD-10-CM | POA: Diagnosis not present

## 2014-09-27 DIAGNOSIS — I739 Peripheral vascular disease, unspecified: Secondary | ICD-10-CM

## 2014-09-27 DIAGNOSIS — R011 Cardiac murmur, unspecified: Secondary | ICD-10-CM

## 2014-09-27 NOTE — Progress Notes (Signed)
History of Present Illness: 80 yo male with history of CAD, HTN, HLD here today for cardiac follow up. He has been followed in the past by Dr. Verl Blalock. He underwent 7V CABG in 1995. Last cath 2010 with 7 patents grafts, total proximal occlusion of all native vessels. Carotid artery dopplers October 2013 with mild bilateral disease. Stress myoview 06/27/13 in setting of dyspnea with inferior wall scar with peri-infarct ischemia, intermediate risk study. I saw him in f/u and he stated that he did not have chest pain. His dyspnea was stable. He did not wish to pursue a cardiac cath.   He is here today for follow up. No chest pain or SOB. No dizziness, near syncope or syncope. He feels well.   Primary Care Physician: Linna Darner  Last Lipid Profile:Lipid Panel     Component Value Date/Time   CHOL 171 11/12/2013 1155   TRIG 101.0 11/12/2013 1155   HDL 58.80 11/12/2013 1155   CHOLHDL 3 11/12/2013 1155   VLDL 20.2 11/12/2013 1155   LDLCALC 92 11/12/2013 1155    Past Medical History  Diagnosis Date  . CAD (coronary artery disease) of artery bypass graft   . Carotid arterial disease     Nonobstructive  . Hypertension   . Dyslipidemia   . Hypertrophy of prostate with urinary obstruction and other lower urinary tract symptoms (LUTS)   . Pneumonia     As a child  . Cataract   . GERD (gastroesophageal reflux disease)   . Hyperlipidemia   . Pneumonia     as child    Past Surgical History  Procedure Laterality Date  . Coronary artery bypass graft  1995    X 7  . Colonoscopy with polypectomy  2004  . Cataracts      both eyes  . Colonoscopy    . Polypectomy      Current Outpatient Prescriptions  Medication Sig Dispense Refill  . aspirin 81 MG tablet Take 81 mg by mouth daily.      . bimatoprost (LUMIGAN) 0.03 % ophthalmic solution 1 drop as directed.      . hydrOXYzine (ATARAX/VISTARIL) 10 MG tablet Take 1 tablet (10 mg total) by mouth 3 (three) times daily as needed. 30 tablet 0  .  ranitidine (ZANTAC) 150 MG tablet Take 1 tablet (150 mg total) by mouth 2 (two) times daily. 60 tablet 2  . simvastatin (ZOCOR) 40 MG tablet Take 0.5 tablets (20 mg total) by mouth daily at 6 PM. 1/2 by mouth daily 45 tablet 1  . trandolapril (MAVIK) 4 MG tablet TAKE 1 TABLET EVERY DAY. 90 tablet 1   No current facility-administered medications for this visit.    Allergies  Allergen Reactions  . Sulfonamide Derivatives     rash    History   Social History  . Marital Status: Married    Spouse Name: N/A    Number of Children: N/A  . Years of Education: N/A   Occupational History  . Not on file.   Social History Main Topics  . Smoking status: Former Smoker    Quit date: 10/18/1984  . Smokeless tobacco: Not on file  . Alcohol Use: 4.2 oz/week    7 Glasses of wine per week  . Drug Use: No  . Sexual Activity: Not on file   Other Topics Concern  . Not on file   Social History Narrative    Family History  Problem Relation Age of Onset  . Stroke Father 79  .  Diabetes Neg Hx   . Cancer Neg Hx   . GER disease Neg Hx   . Heart attack Neg Hx   . Colon cancer Neg Hx   . Esophageal cancer Neg Hx   . Stomach cancer Neg Hx   . Heart disease Neg Hx     Review of Systems:  As stated in the HPI and otherwise negative.   BP 130/62 mmHg  Pulse 66  Ht 6\' 1"  (1.854 m)  Wt 220 lb 6.4 oz (99.973 kg)  BMI 29.08 kg/m2  SpO2 98%  Physical Examination: General: Well developed, well nourished, NAD HEENT: OP clear, mucus membranes moist SKIN: warm, dry. No rashes. Neuro: No focal deficits Musculoskeletal: Muscle strength 5/5 all ext Psychiatric: Mood and affect normal Neck: No JVD, no carotid bruits, no thyromegaly, no lymphadenopathy. Lungs:Clear bilaterally, no wheezes, rhonci, crackles Cardiovascular: Regular rate and rhythm. Systolic murmurs. No gallops or rubs. Abdomen:Soft. Bowel sounds present. Non-tender.  Extremities: No lower extremity edema. Pulses are 2 + in the  bilateral DP/PT.  Stress myoview 06/27/13: Stress Procedure: The patient received IV Lexiscan 0.4 mg over 15-seconds with concurrent low level exercise and then Technetium 4m Sestamibi was injected at 30-seconds while the patient continued walking one more minute. This patient was sob and had chest pain with the Lexiscan injection. Quantitative spect images were obtained after a 45-minute delay.  Stress ECG: No significant change from baseline ECG  QPS  Raw Data Images: Normal; no motion artifact; normal heart/lung ratio.  Stress Images: There is partial reversibility in this area.  Rest Images: There is decreased uptake in the inferior wall.  Subtraction (SDS): There is scar involving apical inferior, mid inferior, basal inferior and inferoseptal wall with moderate size severe periinfarct ischemia.  Transient Ischemic Dilatation (Normal <1.22): n/a  Lung/Heart Ratio (Normal <0.45): 0.33  Quantitative Gated Spect Images  QGS EDV: 93 ml  QGS ESV: 48 ml  Impression  Exercise Capacity: Fair exercise capacity.  BP Response: Normal blood pressure response.  Clinical Symptoms: Typical chest pain.  ECG Impression: No significant ST segment change suggestive of ischemia.  Comparison with Prior Nuclear Study: No images to compare  Overall Impression: Intermediate risk stress nuclear study with a prior infarct in the RCA territory with moderate size severe severity periinfarct ischemia (SDS 7). .  LV Ejection Fraction: 49%. LV Wall Motion: Paradoxical septal motion and hypokinesis in the inferior wall.   Cardiac cath 05/01/09:  The left main coronary artery was free of significant disease.  The left anterior descending artery was completely occluded after 2  septal perforators in its proximal portion.  The circumflex artery was completely occluded in its midportion. There  is a 90% proximal stenosis. The distal vessel filled faintly via  bridging collaterals.  The right coronary artery was  completely occluded near its origin.  The saphenous vein graft to the acute marginal and posterior ascending  branch of the right coronary artery were patent. There was a filling  defect in the mid-to-distal portion of the graft, which did not appear  to be obstructive. There was also a 30-40% narrowing in midportion of  the graft just after the attachment to the acute marginal branch.  The LIMA graft to LAD was patent and functioned normally.  The sequential graft to the marginal and posterolateral branch of  circumflex artery was patent and functioned normally. There were  minimal luminal irregularities in the graft.  The vein graft to the first and second diagonal branch of  the LAD was  patent and functioned normally.  The left ventriculogram performed in the RAO projection showed good wall  motion with no areas of hypokinesis. The estimated fraction was 65%.  HEMODYNAMIC DATA: The aortic pressure was 100/59 with a mean of 77.  The left ventricular pressure was 100/10.  EKG: NSR, rate 66 bpm. Non-specific ST and T wave abnormalities.   Assessment and Plan:   1. CAD: Pt with known CAD with prior CABG in 1995. Last cath 2010 with 7 patent grafts. Stress myoview September 2014 is read as an intermediate risk study with inferior wall infarct with peri-infarct ischemia. This is similar to his stress test in 2010 that led to a cardiac cath demonstrating flow in all bypass grafts. He tells me today that he feels well. Will continue ASA, statin.   2. HTN: BP controlled. No changes.   3. Hyperlipidemia: He is on a statin and tolerating well. Lipids are well controlled.   4. Carotid artery disease: Mild carotid disease by dopplers November 2015.   5. Cardiac murmur: Systolic. Will arrange echo to assess.

## 2014-09-27 NOTE — Patient Instructions (Signed)
Your physician wants you to follow-up in:  12 months.  You will receive a reminder letter in the mail two months in advance. If you don't receive a letter, please call our office to schedule the follow-up appointment.  Your physician has requested that you have an echocardiogram. Echocardiography is a painless test that uses sound waves to create images of your heart. It provides your doctor with information about the size and shape of your heart and how well your heart's chambers and valves are working. This procedure takes approximately one hour. There are no restrictions for this procedure. To be done in January 2016

## 2014-10-07 ENCOUNTER — Ambulatory Visit (INDEPENDENT_AMBULATORY_CARE_PROVIDER_SITE_OTHER): Payer: Medicare Other | Admitting: Internal Medicine

## 2014-10-07 ENCOUNTER — Encounter: Payer: Self-pay | Admitting: Internal Medicine

## 2014-10-07 VITALS — BP 138/70 | HR 62 | Temp 98.0°F | Wt 222.2 lb

## 2014-10-07 DIAGNOSIS — L509 Urticaria, unspecified: Secondary | ICD-10-CM | POA: Diagnosis not present

## 2014-10-07 DIAGNOSIS — I25810 Atherosclerosis of coronary artery bypass graft(s) without angina pectoris: Secondary | ICD-10-CM

## 2014-10-07 MED ORDER — PREDNISONE 20 MG PO TABS: 20.0000 mg | ORAL_TABLET | Freq: Two times a day (BID) | ORAL | Status: DC

## 2014-10-07 MED ORDER — METOPROLOL TARTRATE 25 MG PO TABS: 25.0000 mg | ORAL_TABLET | Freq: Two times a day (BID) | ORAL | Status: DC

## 2014-10-07 NOTE — Progress Notes (Signed)
   Subjective:    Patient ID: Fernando Walker, male    DOB: 15-Apr-1933, 80 y.o.   MRN: [id number]  HPI  He has had no new lesions but the older ones have not improved with ranitidine and hydroxyzine.  He has not been in the woods where he would've been bitten by chiggers since his last visit.  Some of the lesions are now associated with significant eschar as well as small vesicles. He's also having serous drainage from some lesions. There has been no purulence.  The lesions are intensely pruritic  Review of Systems   No associated itchy, watery eyes.  Swelling of the lips or tongue denied.  Shortness of breath, wheezing, or cough absent.  Fever ,chills , or sweats denied.   Diarrhea not present. His loose stool has responded to the probiotic     Objective:   Physical Exam  He has diffuse lesions over the extremities and trunk. Some of these have associated eschar. Some of the larger lesions are several inches in length and width. He also has small scattered  vesicles. Some of the lesions with eschar have serous drainage which is nonpurulent. Some papular lesions noted as well. No dermatographia could be elicited  General appearance :adequately nourished; in no distress. Eyes: No conjunctival inflammation or scleral icterus is present. Oral exam: Dental hygiene is good. Lips and gums are healthy appearing.There is no oropharyngeal erythema or exudate noted.  Heart:  Normal rate and regular rhythm. S1 and S2 normal without gallop, murmur, click, rub or other extra sounds Lungs:Chest clear to auscultation; no wheezes, rhonchi,rales ,or rubs present.No increased work of breathing.  Abdomen: bowel sounds normal, soft and non-tender without masses, organomegaly or hernias noted.  No guarding or rebound. Skin:Warm & dry. No jaundice or tenting Lymphatic: No lymphadenopathy is noted about the head, neck, axilla           Assessment & Plan:  #1 urticarial dermatitis with  papular , eschar & vesicular character diffusely over the trunk and extremities.  Plan: See orders and recommendations.

## 2014-10-07 NOTE — Patient Instructions (Signed)
Hold the simvastatin & the Mavik Minimal Blood Pressure Goal= AVERAGE < 140/90;  Ideal is an AVERAGE < 135/85. This AVERAGE should be calculated from @ least 5-7 BP readings taken @ different times of day on different days of week. You should not respond to isolated BP readings , but rather the AVERAGE for that week .Please bring your  blood pressure cuff to office visits to verify that it is reliable.It  can also be checked against the blood pressure device at the pharmacy. Finger or wrist cuffs are not dependable; an arm cuff is.Fill the  prescription for the BP medication if BP NOT @ goal based on  7 to 14 day average.

## 2014-10-07 NOTE — Progress Notes (Signed)
Pre visit review using our clinic review tool, if applicable. No additional management support is needed unless otherwise documented below in the visit note. 

## 2014-10-08 DIAGNOSIS — L309 Dermatitis, unspecified: Secondary | ICD-10-CM | POA: Diagnosis not present

## 2014-10-10 ENCOUNTER — Encounter: Payer: Self-pay | Admitting: Internal Medicine

## 2014-10-10 DIAGNOSIS — L308 Other specified dermatitis: Secondary | ICD-10-CM | POA: Insufficient documentation

## 2014-10-17 DIAGNOSIS — L309 Dermatitis, unspecified: Secondary | ICD-10-CM | POA: Diagnosis not present

## 2014-10-30 ENCOUNTER — Ambulatory Visit (HOSPITAL_COMMUNITY): Payer: Medicare Other | Attending: Cardiovascular Disease | Admitting: Radiology

## 2014-10-30 DIAGNOSIS — R011 Cardiac murmur, unspecified: Secondary | ICD-10-CM

## 2014-10-30 DIAGNOSIS — E785 Hyperlipidemia, unspecified: Secondary | ICD-10-CM | POA: Insufficient documentation

## 2014-10-30 DIAGNOSIS — I10 Essential (primary) hypertension: Secondary | ICD-10-CM | POA: Insufficient documentation

## 2014-10-30 DIAGNOSIS — I2510 Atherosclerotic heart disease of native coronary artery without angina pectoris: Secondary | ICD-10-CM | POA: Diagnosis not present

## 2014-10-30 DIAGNOSIS — Z87891 Personal history of nicotine dependence: Secondary | ICD-10-CM | POA: Diagnosis not present

## 2014-10-30 NOTE — Progress Notes (Signed)
Echocardiogram performed.  

## 2014-11-05 ENCOUNTER — Other Ambulatory Visit: Payer: Self-pay | Admitting: Dermatology

## 2014-11-05 ENCOUNTER — Telehealth: Payer: Self-pay | Admitting: Cardiovascular Disease

## 2014-11-05 DIAGNOSIS — L308 Other specified dermatitis: Secondary | ICD-10-CM | POA: Diagnosis not present

## 2014-11-05 DIAGNOSIS — L858 Other specified epidermal thickening: Secondary | ICD-10-CM | POA: Diagnosis not present

## 2014-11-05 DIAGNOSIS — R238 Other skin changes: Secondary | ICD-10-CM | POA: Diagnosis not present

## 2014-11-05 NOTE — Telephone Encounter (Signed)
NEw Message  Pt wife was calling about echo results. Please call back and discuss.

## 2014-11-05 NOTE — Telephone Encounter (Signed)
Spoke with pt's wife and reviewed echo results with her.

## 2014-11-06 DIAGNOSIS — M7021 Olecranon bursitis, right elbow: Secondary | ICD-10-CM | POA: Diagnosis not present

## 2014-11-11 DIAGNOSIS — L111 Transient acantholytic dermatosis [Grover]: Secondary | ICD-10-CM

## 2014-11-11 HISTORY — DX: Transient acantholytic dermatosis (grover): L11.1

## 2014-11-14 ENCOUNTER — Encounter: Payer: Self-pay | Admitting: Internal Medicine

## 2014-11-14 ENCOUNTER — Ambulatory Visit (INDEPENDENT_AMBULATORY_CARE_PROVIDER_SITE_OTHER): Payer: Medicare Other | Admitting: Internal Medicine

## 2014-11-14 VITALS — BP 144/80 | HR 68 | Temp 98.2°F | Ht 73.0 in | Wt 217.1 lb

## 2014-11-14 DIAGNOSIS — I2510 Atherosclerotic heart disease of native coronary artery without angina pectoris: Secondary | ICD-10-CM

## 2014-11-14 DIAGNOSIS — I10 Essential (primary) hypertension: Secondary | ICD-10-CM

## 2014-11-14 DIAGNOSIS — L111 Transient acantholytic dermatosis [Grover]: Secondary | ICD-10-CM

## 2014-11-14 NOTE — Assessment & Plan Note (Signed)
Restart the low-dose beta blocker ,metoprolol 25 mg one half twice a day, if blood pressure averages 140/90 or higher.

## 2014-11-14 NOTE — Progress Notes (Signed)
Pre visit review using our clinic review tool, if applicable. No additional management support is needed unless otherwise documented below in the visit note. 

## 2014-11-14 NOTE — Progress Notes (Signed)
   Subjective:    Patient ID: Fernando Walker, male    DOB: 10-20-1932, 81 y.o.   MRN: [id number]  HPI  He has been diagnosed with Grover's disease; this is dramatically responsive to topical agents. He's continuing the ranitidine because it helps the pruritis.Marland Kitchen  He has weekly averages of his blood pressure. Initially average was 142/70. Most recent average is 130/69. This is off his metoprolol and ACE inhibitor.  He rarely ingests fried foods and does not add salt @ the  table.  He is not able to exercise due to COPD.  He denies any active cardio pulmonary symptoms except exertional dyspnea which is chronic.  Review of Systems   Chest pain, palpitations, tachycardia,  paroxysmal nocturnal dyspnea, claudication or edema are absent.      Objective:   Physical Exam  Positive or pertinent findings include: Pattern alopecia is present. He has a goatee He has a grade 1/2 systolic murmur Breath sounds decreased He has a few scattered minor papules with some excoriation. There is been dramatic improvement in the diffuse dermatitis. He has a larger 5 x 5 cm papular lesion over the left shin.  General appearance :adequately nourished; in no distress. Eyes: No conjunctival inflammation or scleral icterus is present. Heart:  Normal rate and regular rhythm. S1 and S2 normal without gallop, click, rub or other extra sounds  Lungs:Chest clear to auscultation; no wheezes, rhonchi,rales ,or rubs present.No increased work of breathing.  Abdomen: bowel sounds normal, soft and non-tender without masses, organomegaly or hernias noted.  No guarding or rebound.  Vascular : all pulses equal ; no bruits present. Skin:Warm & dry. No jaundice or tenting Lymphatic: No lymphadenopathy is noted about the head, neck, axilla        Assessment & Plan:  See Current Assessment & Plan in Problem List under specific Diagnosis

## 2014-11-14 NOTE — Patient Instructions (Signed)
  Minimal Blood Pressure Goal= AVERAGE < 140/90;  Ideal is an AVERAGE < 135/85. This AVERAGE should be calculated from @ least 5-7 BP readings taken @ different times of day on different days of week. You should not respond to isolated BP readings , but rather the AVERAGE for that week .Please bring your  blood pressure cuff to office visits to verify that it is reliable.It  can also be checked against the blood pressure device at the pharmacy. Finger or wrist cuffs are not dependable; an arm cuff is.  Metoprolol 25 mg 1/2 twice a day if BP averages 140/90 or higher

## 2014-11-15 ENCOUNTER — Telehealth: Payer: Self-pay | Admitting: Internal Medicine

## 2014-11-15 NOTE — Telephone Encounter (Signed)
emmi emailed °

## 2014-11-27 DIAGNOSIS — M7021 Olecranon bursitis, right elbow: Secondary | ICD-10-CM | POA: Diagnosis not present

## 2014-12-16 ENCOUNTER — Other Ambulatory Visit: Payer: Self-pay | Admitting: Internal Medicine

## 2014-12-17 ENCOUNTER — Other Ambulatory Visit: Payer: Self-pay | Admitting: Internal Medicine

## 2014-12-30 ENCOUNTER — Other Ambulatory Visit: Payer: Self-pay | Admitting: Dermatology

## 2014-12-30 DIAGNOSIS — L821 Other seborrheic keratosis: Secondary | ICD-10-CM | POA: Diagnosis not present

## 2014-12-30 DIAGNOSIS — D485 Neoplasm of uncertain behavior of skin: Secondary | ICD-10-CM | POA: Diagnosis not present

## 2014-12-30 DIAGNOSIS — L820 Inflamed seborrheic keratosis: Secondary | ICD-10-CM | POA: Diagnosis not present

## 2014-12-30 DIAGNOSIS — L111 Transient acantholytic dermatosis [Grover]: Secondary | ICD-10-CM | POA: Diagnosis not present

## 2014-12-30 DIAGNOSIS — L03011 Cellulitis of right finger: Secondary | ICD-10-CM | POA: Diagnosis not present

## 2015-01-02 DIAGNOSIS — M25721 Osteophyte, right elbow: Secondary | ICD-10-CM | POA: Diagnosis not present

## 2015-01-02 DIAGNOSIS — M7021 Olecranon bursitis, right elbow: Secondary | ICD-10-CM | POA: Diagnosis not present

## 2015-01-06 DIAGNOSIS — Z9889 Other specified postprocedural states: Secondary | ICD-10-CM | POA: Diagnosis not present

## 2015-01-11 DIAGNOSIS — Z9889 Other specified postprocedural states: Secondary | ICD-10-CM | POA: Diagnosis not present

## 2015-01-20 DIAGNOSIS — Z9889 Other specified postprocedural states: Secondary | ICD-10-CM | POA: Diagnosis not present

## 2015-02-03 DIAGNOSIS — Z9889 Other specified postprocedural states: Secondary | ICD-10-CM | POA: Diagnosis not present

## 2015-03-03 DIAGNOSIS — Z9889 Other specified postprocedural states: Secondary | ICD-10-CM | POA: Diagnosis not present

## 2015-04-08 DIAGNOSIS — L720 Epidermal cyst: Secondary | ICD-10-CM | POA: Diagnosis not present

## 2015-04-08 DIAGNOSIS — L245 Irritant contact dermatitis due to other chemical products: Secondary | ICD-10-CM | POA: Diagnosis not present

## 2015-04-08 DIAGNOSIS — L821 Other seborrheic keratosis: Secondary | ICD-10-CM | POA: Diagnosis not present

## 2015-04-08 DIAGNOSIS — D485 Neoplasm of uncertain behavior of skin: Secondary | ICD-10-CM | POA: Diagnosis not present

## 2015-04-08 DIAGNOSIS — L280 Lichen simplex chronicus: Secondary | ICD-10-CM | POA: Diagnosis not present

## 2015-04-28 ENCOUNTER — Encounter: Payer: Self-pay | Admitting: Internal Medicine

## 2015-08-04 ENCOUNTER — Ambulatory Visit (INDEPENDENT_AMBULATORY_CARE_PROVIDER_SITE_OTHER): Payer: Medicare Other

## 2015-08-04 DIAGNOSIS — Z23 Encounter for immunization: Secondary | ICD-10-CM

## 2015-09-29 DIAGNOSIS — H401131 Primary open-angle glaucoma, bilateral, mild stage: Secondary | ICD-10-CM | POA: Diagnosis not present

## 2015-09-29 DIAGNOSIS — Z961 Presence of intraocular lens: Secondary | ICD-10-CM | POA: Diagnosis not present

## 2015-10-08 ENCOUNTER — Encounter: Payer: Self-pay | Admitting: Cardiovascular Disease

## 2015-10-21 NOTE — Progress Notes (Signed)
Chief Complaint  Patient presents with  . Follow-up   History of Present Illness: 82 yo male with history of CAD, HTN, HLD here today for cardiac follow up. He has been followed in the past by Dr. Verl Blalock. He underwent 7V CABG in 1995. Last cath 2010 with 7 patents grafts, total proximal occlusion of all native vessels. Carotid artery dopplers October 2015 with mild bilateral disease. Stress myoview 06/27/13 in setting of dyspnea with inferior wall scar with peri-infarct ischemia, intermediate risk study. I saw him in f/u and he stated that he did not have chest pain. His dyspnea was stable. He did not wish to pursue a cardiac cath. Echo 10/30/14 with normal LV function, mild AS.   He is here today for follow up. No chest pain or SOB. No dizziness, near syncope or syncope. He feels well.   Primary Care Physician: Linna Darner  Past Medical History  Diagnosis Date  . CAD (coronary artery disease) of artery bypass graft   . Carotid arterial disease (HCC)     Nonobstructive  . Hypertension   . Dyslipidemia   . Hypertrophy of prostate with urinary obstruction and other lower urinary tract symptoms (LUTS)   . Pneumonia     As a child  . Cataract   . GERD (gastroesophageal reflux disease)   . Hyperlipidemia   . Pneumonia     as child  . 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 Coronary artery bypass graft  1995    X 7  . Colonoscopy with polypectomy  2004  . Cataracts      both eyes  . Colonoscopy    . Polypectomy      Current Outpatient Prescriptions  Medication Sig Dispense Refill  . aspirin 81 MG tablet Take 81 mg by mouth daily.      . bimatoprost (LUMIGAN) 0.03 % ophthalmic solution 1 drop as directed.      . simvastatin (ZOCOR) 40 MG tablet Take 0.5 tablets (20 mg total) by mouth at bedtime. 45 tablet 3   No current facility-administered medications for this visit.    Allergies  Allergen Reactions  . Sulfonamide Derivatives    rash    Social History   Social History  . Marital Status: Married    Spouse Name: N/A  . Number of Children: N/A  . Years of Education: N/A   Occupational History  . Not on file.   Social History Main Topics  . Smoking status: Former Smoker    Quit date: 10/18/1984  . Smokeless tobacco: Former Systems developer  . Alcohol Use: 4.2 oz/week    7 Glasses of wine per week  . Drug Use: No  . Sexual Activity: Not on file   Other Topics Concern  . Not on file   Social History Narrative    Family History  Problem Relation Age of Onset  . Stroke Father 61  . Diabetes Neg Hx   . Cancer Neg Hx   . GER disease Neg Hx   . Heart attack Neg Hx   . Colon cancer Neg Hx   . Esophageal cancer Neg Hx   . Stomach cancer Neg Hx   . Heart disease Neg Hx     Review of Systems:  As stated in the HPI and otherwise negative.   BP 126/74 mmHg  Pulse 59  Ht 6\' 2"  (1.88 m)  Wt 215 lb 1.9 oz (97.578 kg)  BMI 27.61 kg/m2  Physical Examination: General: Well developed, well nourished, NAD HEENT: OP clear, mucus membranes moist SKIN: warm, dry. No rashes. Neuro: No focal deficits Musculoskeletal: Muscle strength 5/5 all ext Psychiatric: Mood and affect normal Neck: No JVD, no carotid bruits, no thyromegaly, no lymphadenopathy. Lungs:Clear bilaterally, no wheezes, rhonci, crackles Cardiovascular: Regular rate and rhythm. Systolic murmurs. No gallops or rubs. Abdomen:Soft. Bowel sounds present. Non-tender.  Extremities: No lower extremity edema. Pulses are 2 + in the bilateral DP/PT.  Echo 10/30/14: - Left ventricle: The cavity size was normal. Wall thickness was normal. Systolic function was normal. The estimated ejection fraction was in the range of 55% to 60%. - Aortic valve: There was very mild stenosis. Mean gradient (S): 12 mm Hg. Peak gradient (S): 21 mm Hg. - Mitral valve: Calcified annulus. Mildly thickened leaflets . - Left atrium: The atrium was mildly dilated. - Atrial septum:  No defect or patent foramen ovale was identified.  Stress myoview 06/27/13: Stress Procedure: The patient received IV Lexiscan 0.4 mg over 15-seconds with concurrent low level exercise and then Technetium 43m Sestamibi was injected at 30-seconds while the patient continued walking one more minute. This patient was sob and had chest pain with the Lexiscan injection. Quantitative spect images were obtained after a 45-minute delay.  Stress ECG: No significant change from baseline ECG  QPS  Raw Data Images: Normal; no motion artifact; normal heart/lung ratio.  Stress Images: There is partial reversibility in this area.  Rest Images: There is decreased uptake in the inferior wall.  Subtraction (SDS): There is scar involving apical inferior, mid inferior, basal inferior and inferoseptal wall with moderate size severe periinfarct ischemia.  Transient Ischemic Dilatation (Normal <1.22): n/a  Lung/Heart Ratio (Normal <0.45): 0.33  Quantitative Gated Spect Images  QGS EDV: 93 ml  QGS ESV: 48 ml  Impression  Exercise Capacity: Fair exercise capacity.  BP Response: Normal blood pressure response.  Clinical Symptoms: Typical chest pain.  ECG Impression: No significant ST segment change suggestive of ischemia.  Comparison with Prior Nuclear Study: No images to compare  Overall Impression: Intermediate risk stress nuclear study with a prior infarct in the RCA territory with moderate size severe severity periinfarct ischemia (SDS 7). .  LV Ejection Fraction: 49%. LV Wall Motion: Paradoxical septal motion and hypokinesis in the inferior wall.   Cardiac cath 05/01/09:  The left main coronary artery was free of significant disease.  The left anterior descending artery was completely occluded after 2  septal perforators in its proximal portion.  The circumflex artery was completely occluded in its midportion. There  is a 90% proximal stenosis. The distal vessel filled faintly via  bridging collaterals.  The  right coronary artery was completely occluded near its origin.  The saphenous vein graft to the acute marginal and posterior ascending  branch of the right coronary artery were patent. There was a filling  defect in the mid-to-distal portion of the graft, which did not appear  to be obstructive. There was also a 30-40% narrowing in midportion of  the graft just after the attachment to the acute marginal branch.  The LIMA graft to LAD was patent and functioned normally.  The sequential graft to the marginal and posterolateral branch of  circumflex artery was patent and functioned normally. There were  minimal luminal irregularities in the graft.  The vein graft to the first and second diagonal branch of the LAD was  patent and functioned normally.  The left ventriculogram performed in the RAO projection showed  good wall  motion with no areas of hypokinesis. The estimated fraction was 65%.  HEMODYNAMIC DATA: The aortic pressure was 100/59 with a mean of 77.  The left ventricular pressure was 100/10.  EKG:  EKG is ordered today. The ekg ordered today demonstrates sinus brady, rate 59 bpm. Non-specific T wave abn.  Recent Labs: No results found for requested labs within last 365 days.   Lipid Panel    Component Value Date/Time   CHOL 171 11/12/2013 1155   TRIG 101.0 11/12/2013 1155   HDL 58.80 11/12/2013 1155   CHOLHDL 3 11/12/2013 1155   VLDL 20.2 11/12/2013 1155   LDLCALC 92 11/12/2013 1155     Wt Readings from Last 3 Encounters:  10/22/15 215 lb 1.9 oz (97.578 kg)  11/14/14 217 lb 0.8 oz (98.453 kg)  10/07/14 222 lb 4 oz (100.812 kg)     Other studies Reviewed: Additional studies/ records that were reviewed today include: . Review of the above records demonstrates:   Assessment and Plan:   1. CAD: Pt with known CAD with prior CABG in 1995. Last cath 2010 with 7 patent grafts. Stress myoview September 2014 is read as an intermediate risk study with inferior wall infarct  with peri-infarct ischemia. This is similar to his stress test in 2010 that led to a cardiac cath demonstrating flow in all bypass grafts. He continues to do well. Will continue medical management for now. Will continue ASA and restart statin.   2. HTN: BP controlled. No changes.   3. Hyperlipidemia: He had been on a statin and tolerated well. He stopped taking last year. Will restart statin. Check lipids and LFTs today and repeat in 12 weeks.   4. Carotid artery disease: Mild carotid disease by dopplers November 2015. Repeat November 2017.   5. Aortic stenosis: Mild by echo January 2016.    Current medicines are reviewed at length with the patient today.  The patient does not have concerns regarding medicines.  The following changes have been made:  no change  Labs/ tests ordered today include:   Orders Placed This Encounter  Procedures  . Hepatic function panel  . Lipid Profile  . Hepatic function panel  . Lipid Profile  . EKG 12-Lead    Disposition:   FU with me in 12  months  Signed, Lauree Chandler, MD 10/22/2015 1:31 PM    [facility] [address], [place], [place]  [address] Phone: [phone]; Fax: [phone]

## 2015-10-22 ENCOUNTER — Ambulatory Visit (INDEPENDENT_AMBULATORY_CARE_PROVIDER_SITE_OTHER): Payer: Medicare Other | Admitting: Cardiovascular Disease

## 2015-10-22 ENCOUNTER — Encounter: Payer: Self-pay | Admitting: Cardiovascular Disease

## 2015-10-22 VITALS — BP 126/74 | HR 59 | Ht 74.0 in | Wt 215.1 lb

## 2015-10-22 DIAGNOSIS — I350 Nonrheumatic aortic (valve) stenosis: Secondary | ICD-10-CM

## 2015-10-22 DIAGNOSIS — I2510 Atherosclerotic heart disease of native coronary artery without angina pectoris: Secondary | ICD-10-CM | POA: Diagnosis not present

## 2015-10-22 DIAGNOSIS — I6523 Occlusion and stenosis of bilateral carotid arteries: Secondary | ICD-10-CM | POA: Diagnosis not present

## 2015-10-22 DIAGNOSIS — E785 Hyperlipidemia, unspecified: Secondary | ICD-10-CM | POA: Diagnosis not present

## 2015-10-22 DIAGNOSIS — I10 Essential (primary) hypertension: Secondary | ICD-10-CM

## 2015-10-22 LAB — HEPATIC FUNCTION PANEL
ALT: 19 U/L (ref 9–46)
AST: 20 U/L (ref 10–35)
Albumin: 4.2 g/dL (ref 3.6–5.1)
Alkaline Phosphatase: 42 U/L (ref 40–115)
Bilirubin, Direct: 0.2 mg/dL (ref <=0.2)
Indirect Bilirubin: 0.9 mg/dL (ref 0.2–1.2)
TOTAL PROTEIN: 7.3 g/dL (ref 6.1–8.1)
Total Bilirubin: 1.1 mg/dL (ref 0.2–1.2)

## 2015-10-22 LAB — LIPID PANEL
CHOLESTEROL: 206 mg/dL — AB (ref 125–200)
HDL: 72 mg/dL (ref >=40)
LDL Cholesterol: 120 mg/dL (ref <130)
TRIGLYCERIDES: 68 mg/dL (ref <150)
Total CHOL/HDL Ratio: 2.9 Ratio (ref <=5.0)
VLDL: 14 mg/dL (ref <30)

## 2015-10-22 MED ORDER — SIMVASTATIN 40 MG PO TABS: 20.0000 mg | ORAL_TABLET | Freq: Every day | ORAL | Status: DC

## 2015-10-22 NOTE — Patient Instructions (Addendum)
Medication Instructions:  Your physician has recommended you make the following change in your medication: Resume simvastatin 20 mg by mouth  daily. (take half of 40 mg daily)   Labwork: Lab work to be done today--lipid and liver profiles Your physician recommends that you return for fasting lab work in: 12 weeks--Lipid and Liver profiles   Testing/Procedures:  Your physician has requested that you have a carotid duplex. This test is an ultrasound of the carotid arteries in your neck. It looks at blood flow through these arteries that supply the brain with blood. Allow one hour for this exam. There are no restrictions or special instructions. To be done in November 2017    Follow-Up: Your physician wants you to follow-up in: 12 months.  You will receive a reminder letter in the mail two months in advance. If you don't receive a letter, please call our office to schedule the follow-up appointment.   Any Other Special Instructions Will Be Listed Below (If Applicable).     If you need a refill on your cardiac medications before your next appointment, please call your pharmacy.

## 2015-10-24 ENCOUNTER — Telehealth: Payer: Self-pay | Admitting: Cardiovascular Disease

## 2015-10-24 ENCOUNTER — Other Ambulatory Visit: Payer: Self-pay | Admitting: *Deleted

## 2015-10-24 DIAGNOSIS — E785 Hyperlipidemia, unspecified: Secondary | ICD-10-CM

## 2015-10-24 NOTE — Telephone Encounter (Signed)
New Message  Pt calling to discuss lab results from 10/21/14. Please call back and discuss.

## 2015-10-24 NOTE — Telephone Encounter (Signed)
Notified of lab results. Schedule repeat labs for 3/29-LP and LFT's.

## 2015-11-03 ENCOUNTER — Encounter: Payer: Self-pay | Admitting: Internal Medicine

## 2015-11-03 ENCOUNTER — Ambulatory Visit (INDEPENDENT_AMBULATORY_CARE_PROVIDER_SITE_OTHER): Payer: Medicare Other | Admitting: Internal Medicine

## 2015-11-03 VITALS — BP 140/84 | HR 67 | Temp 98.3°F | Resp 18 | Wt 215.0 lb

## 2015-11-03 DIAGNOSIS — I25810 Atherosclerosis of coronary artery bypass graft(s) without angina pectoris: Secondary | ICD-10-CM | POA: Diagnosis not present

## 2015-11-03 DIAGNOSIS — I10 Essential (primary) hypertension: Secondary | ICD-10-CM

## 2015-11-03 DIAGNOSIS — Z23 Encounter for immunization: Secondary | ICD-10-CM | POA: Diagnosis not present

## 2015-11-03 DIAGNOSIS — E785 Hyperlipidemia, unspecified: Secondary | ICD-10-CM

## 2015-11-03 DIAGNOSIS — I6523 Occlusion and stenosis of bilateral carotid arteries: Secondary | ICD-10-CM | POA: Diagnosis not present

## 2015-11-03 NOTE — Progress Notes (Signed)
Subjective:    Patient ID: Fernando Walker, male    DOB: 1933-09-28, 82 y.o.   MRN: [id number]  HPI He is here to establish with a new pcp.    CAD s/p CABG:  He is taking the aspirin and simvastatin daily.   He is very active, but not exercising regularly. He denies chest pain, palpitations , shortness of breath , leg edea, headaches and lightheadedness.  He is very active, but not exercising regularly.  He monitors his BP at home and it is well controlled.  He tries to stays very active throughout the year.  He walks more in the summer.   Hyperlipidemia: He is taking his medication daily. He is compliant with a low fat/cholesterol diet. He is very active , but not necessarily exercising regularly. He denies myalgias.    Medications and allergies reviewed with patient and updated if appropriate.  Patient Active Problem List   Diagnosis Date Noted  . 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 Hx of adenomatous colonic polyps 07/27/2011  . Carotid bruit 06/02/2011  . FASTING HYPERGLYCEMIA 11/10/2009  . Essential hypertension 04/29/2009  . CAD, ARTERY BYPASS GRAFT 04/29/2009  . MYOCARDIAL PERFUSION SCAN, WITH STRESS TEST, ABNORMAL 04/29/2009  . DYSLIPIDEMIA 04/24/2009  . HYPERTROPHY PROSTATE W/UR OBST & OTH LUTS 04/22/2009    Current Outpatient Prescriptions on File Prior to Visit  Medication Sig Dispense Refill  . aspirin 81 MG tablet Take 81 mg by mouth daily.      . bimatoprost (LUMIGAN) 0.03 % ophthalmic solution 1 drop as directed.      . simvastatin (ZOCOR) 40 MG tablet Take 0.5 tablets (20 mg total) by mouth at bedtime. 45 tablet 3   No current facility-administered medications on file prior to visit.    Past Medical History  Diagnosis Date  . CAD (coronary artery disease) of artery bypass graft   . Carotid arterial disease (HCC)     Nonobstructive  . Hypertension   . Dyslipidemia   . Hypertrophy of prostate with  urinary obstruction and other lower urinary tract symptoms (LUTS)   . Pneumonia     As a child  . Cataract   . GERD (gastroesophageal reflux disease)   . Hyperlipidemia   . Pneumonia     as child  . 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 Coronary artery bypass graft  1995    X 7  . Colonoscopy with polypectomy  2004  . Cataracts      both eyes  . Colonoscopy    . Polypectomy      Social History   Social History  . Marital Status: Married    Spouse Name: N/A  . Number of Children: N/A  . Years of Education: N/A   Social History Main Topics  . Smoking status: Former Smoker    Quit date: 10/18/1984  . Smokeless tobacco: Former Systems developer  . Alcohol Use: 4.2 oz/week    7 Glasses of wine per week  . Drug Use: No  . Sexual Activity: Not Asked   Other Topics Concern  . None   Social History Narrative    Family History  Problem Relation Age of Onset  . Stroke Father 20  . Diabetes Neg Hx   . Cancer Neg Hx   . GER disease Neg Hx   . Heart attack Neg Hx   . Colon cancer Neg Hx   .  Esophageal cancer Neg Hx   . Stomach cancer Neg Hx   . Heart disease Neg Hx     Review of Systems  Constitutional: Negative for fever and chills.  Respiratory: Negative for cough, shortness of breath and wheezing.   Cardiovascular: Negative for chest pain, palpitations and leg swelling.  Musculoskeletal: Negative for myalgias.  Neurological: Negative for dizziness, weakness, light-headedness, numbness and headaches.       Objective:   Filed Vitals:   11/03/15 0950  BP: 140/84  Pulse: 67  Temp: 98.3 F (36.8 C)  Resp: 18   Filed Weights   11/03/15 0950  Weight: 215 lb (97.523 kg)   Body mass index is 27.59 kg/(m^2).   Physical Exam Constitutional: Appears well-developed and well-nourished. No distress.  Neck: Neck supple. No tracheal deviation present. No thyromegaly present.  No carotid bruit. No cervical adenopathy.     Cardiovascular: Normal rate, regular rhythm and normal heart sounds.   No murmur heard.  No edema Pulmonary/Chest: Effort normal and breath sounds normal. No respiratory distress. No wheezes.        Assessment & Plan:   See Problem List for Assessment and Plan of chronic medical problems.  Follow up annually

## 2015-11-03 NOTE — Patient Instructions (Signed)
  We have reviewed your prior records including labs and tests today.  All other Health Maintenance issues reviewed.   All recommended immunizations and age-appropriate screenings are up-to-date.  Pneumonia vaccine administered today.   Medications reviewed and updated.  No changes recommended at this time.  Please followup once a year

## 2015-11-03 NOTE — Assessment & Plan Note (Signed)
lood pressure well-controlled at home Continue to monitor Currently not on any medication Continue healthy lifestyle-continue to be active

## 2015-11-03 NOTE — Progress Notes (Signed)
Pre visit review using our clinic review tool, if applicable. No additional management support is needed unless otherwise documented below in the visit note. 

## 2015-11-03 NOTE — Assessment & Plan Note (Signed)
Following with cardiology Asymptomatic Taking aspirin 81 mg daily and simvastatin 40 mg daily Encouraged regular exercise

## 2015-11-03 NOTE — Assessment & Plan Note (Signed)
Taking simvastatin 40 mg daily Recent had the panel checked, which is when he was started on simvastatin again Continue to be active - could ideally increase exercise

## 2016-01-05 DIAGNOSIS — H401131 Primary open-angle glaucoma, bilateral, mild stage: Secondary | ICD-10-CM | POA: Diagnosis not present

## 2016-01-14 ENCOUNTER — Other Ambulatory Visit (INDEPENDENT_AMBULATORY_CARE_PROVIDER_SITE_OTHER): Payer: Medicare Other | Admitting: *Deleted

## 2016-01-14 DIAGNOSIS — E785 Hyperlipidemia, unspecified: Secondary | ICD-10-CM | POA: Diagnosis not present

## 2016-01-14 LAB — LIPID PANEL
CHOL/HDL RATIO: 2.3 ratio (ref <=5.0)
CHOLESTEROL: 143 mg/dL (ref 125–200)
HDL: 61 mg/dL (ref >=40)
LDL Cholesterol: 71 mg/dL (ref <130)
TRIGLYCERIDES: 56 mg/dL (ref <150)
VLDL: 11 mg/dL (ref <30)

## 2016-01-14 LAB — HEPATIC FUNCTION PANEL
ALBUMIN: 4.1 g/dL (ref 3.6–5.1)
ALK PHOS: 48 U/L (ref 40–115)
ALT: 19 U/L (ref 9–46)
AST: 22 U/L (ref 10–35)
BILIRUBIN TOTAL: 0.9 mg/dL (ref 0.2–1.2)
Bilirubin, Direct: 0.2 mg/dL (ref <=0.2)
Indirect Bilirubin: 0.7 mg/dL (ref 0.2–1.2)
Total Protein: 6.9 g/dL (ref 6.1–8.1)

## 2016-03-29 DIAGNOSIS — H401131 Primary open-angle glaucoma, bilateral, mild stage: Secondary | ICD-10-CM | POA: Diagnosis not present

## 2016-06-07 NOTE — Progress Notes (Addendum)
Subjective:   Fernando Walker is a 82 y.o. male who presents for Medicare Annual (Subsequent) preventive examination.   Cardiac Risk Factors include: advanced age (>64men, >39 women);dyslipidemia;family history of premature cardiovascular disease;Other (see comment), Risk factor comments: caregiver full time HRA assessment completed during this visit with Fernando Walker  The Patient was informed that the wellness visit is to identify future health risk and educate and initiate measures that can reduce risk for increased disease through the lifespan.    NO ROS; Medicare Wellness Visit  Psychosocial: (Father had stroke)  Wife has health issues; Alz; (married 26 years)  falling; has hairline fx in back with brace Was incontinent; may be considering surgery due to UTI and bowel issue Discussed breaks in caregiving which is becoming more demanding Dtr and son live fairly close; (dtr is psychology professor at [employer])  [employer]; will fup for respite  Plans for ongoing care discussed Educated on family support  Educated on diversion;  Educated on [employer] and learning about how to divert and support through the various stages  [employer] shared;  Encouraged normal lifestyle when possible; golf; outings etc.    Meds; discussed cholesterol medicine/ was having fatigue and muscle issues and stopped it; last lipid drawn in March and will ask Fernando Walker if we can recheck prior to his cardiology visit in Nov. Pre-diabetes reviewed with A1c of 6.2 (2015) ;  c/o of "stinging in feet" now Education provided  Will consult Fernando Walker to redraw A1c and lipids    Tobacco: former smoker quit 1986; ETOH; occasional   PMH;  Hyperlipidemia; cho 143; Trig 56; HDL 61; ratio 2.3  CABG /7 bypasses in 95;  3 to 82 yo; went for stress test; after photography was not good after last test but overall neg; requested to stop chol but cardiology wanted to continue; Agreed to fup  with Fernando Walker; has stopped statin   Pre diabetes; A1c 6.2 last checked 2015; FBS elevated 113. 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   Personal safety issues reviewed:  1.  for risk such as safe community/ yes/ in the last 20 years no issues  2.  smoke detector/ yes/ has alarm if doors open; carbon monoxide protector  3.  firearms safety if applicable; discussed safety /  4. protection when in the sun; wears sunscreen;  Wears a hot  5. driving safety for seniors or any recent  accidents.    Risk for Depression reviewed: Any emotional problems? Anxious, depressed, irritable, sad or blue?  No  Denies feeling depressed or hopeless; voices pleasure in daily life How many social activities have you been engaged in within the last 2 weeks? no   Cognitive; memory issues  Manages checkbook, medications; no failures of task Ad8 score reviewed for issues;  Issues making decisions; no  Less interest in hobbies / activities" no  Repeats  questions, stories; family complaining: NO  Trouble using ordinary gadgets; microwave; computer: no  Forgets the month or year: no  Mismanaging finances: no  Missing apt: no but does write them down  Daily problems with thinking of memory NO Ad8 score is 0  MMSE not appropriate unless AD8 score is > 2   Advanced Directive addressed; He has completed; Son is HCPOA    Counseling Health Maintenance Gaps:  Colonoscopy; 08/2011; 82yo aged out  EKG: 10/2015  Prostate cancer screening: 2015- aged out per his doctor   Hearing: has hearing aids  Ophthalmology exam/ annual eye exam; Fernando Walker   Glaucoma screening/ takes medicine for glaucoma Diabetic retinopathy has been meg  Immunizations Due: (Vaccines reviewed and educated regarding any overdue)  Tdap-need tetanus; not sure when he had the last one but agreed to update this year zostavax- not sure he had chicken pox /  Flu / will take in [place] / oct   Established and updated Risk reviewed and appropriate referral made or health recommendations:      Objective:     Vitals: Ht 6\' 3"  (1.905 m)   Wt 210 lb (95.3 kg)   BMI 26.25 kg/m   Body mass index is 26.25 kg/m.   Tobacco History  Smoking Status  . Former Smoker  . Quit date: 10/18/1984  Smokeless Tobacco  . Former Engineer, structural given: Yes   Past Medical History:  Diagnosis Date  . CAD (coronary artery disease) of artery bypass graft   . Carotid arterial disease (HCC)    Nonobstructive  . Cataract   . Dyslipidemia   . GERD (gastroesophageal reflux disease)   . 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 Hyperlipidemia   . Hypertension   . Hypertrophy of prostate with urinary obstruction and other lower urinary tract symptoms (LUTS)   . Pneumonia    As a child  . Pneumonia    as child   Past Surgical History:  Procedure Laterality Date  . cataracts     both eyes  . COLONOSCOPY    . colonoscopy with polypectomy  2004  . CORONARY ARTERY BYPASS GRAFT   1995   X 7  . POLYPECTOMY     Family History  Problem Relation Age of Onset  . Stroke Father 60  . Diabetes Neg Hx   . Cancer Neg Hx   . GER disease Neg Hx   . Heart attack Neg Hx   . Colon cancer Neg Hx   . Esophageal cancer Neg Hx   . Stomach cancer Neg Hx   . Heart disease Neg Hx    History  Sexual Activity  . Sexual activity: Not on file    Outpatient Encounter Prescriptions as of 06/08/2016  Medication Sig  . aspirin 81 MG tablet Take 81 mg by mouth daily.    . bimatoprost (LUMIGAN) 0.03 % ophthalmic solution 1 drop as directed.    . simvastatin (ZOCOR) 40 MG tablet Take 0.5 tablets (20 mg total) by mouth at bedtime. (Patient  not taking: Reported on 06/08/2016)   No facility-administered encounter medications on file as of 06/08/2016.     Activities of Daily Living In your present state of health, do you have any difficulty performing the following activities: 06/08/2016  Hearing? N  Vision? Y  Difficulty concentrating or making decisions? N  Walking or climbing stairs? N  Dressing or bathing? N  Doing errands, shopping? N  Preparing Food and eating ? N  Using the Toilet? N  In the past six months, have you accidently leaked urine? N  Do you have problems with loss of bowel control? N  Managing your Medications? N  Managing your Finances? N  Housekeeping or managing your Housekeeping? N  Some recent data might be hidden    Patient Care Team: Binnie Rail, MD as PCP - General (Internal Medicine)    Assessment:     Exercise Activities and Dietary recommendations Current Exercise Habits: Home exercise routine (recommending resources for spouse so that he can re-enaged in social activity)  Goals    . patient          Can review teepa snow online  alzheimers association has the 24/7 helpline  [employer]  [employer]; [phone] Senior Directory; Information regarding Long Term Care  Will fup on Long Term Care Insurance         Fall Risk Fall Risk  11/14/2014 11/12/2013  Falls in the past year? No No   Depression Screen PHQ 2/9 Scores 11/14/2014 11/12/2013  PHQ - 2 Score 0 0     Cognitive Testing MMSE - Mini Mental State Exam 06/08/2016  Not completed: (No Data)    Ad8 score 0   Immunization History  Administered Date(s) Administered  . Influenza Split 07/27/2011, 08/03/2012  . Influenza Whole 09/07/2007, 07/24/2008, 07/23/2009, 08/07/2010  . Influenza, High Dose Seasonal PF 08/01/2013  . Influenza,inj,Quad PF,36+ Mos 06/26/2014, 08/04/2015  . Pneumococcal Conjugate-13 11/03/2015  . Pneumococcal Polysaccharide-23 09/13/2012   Screening Tests Health Maintenance  Topic Date Due  . TETANUS/TDAP  02/10/1952  . ZOSTAVAX  02/11/1995  . INFLUENZA VACCINE  05/18/2016  . PNA vac Low Risk Adult  Completed      Plan:   Would like to have lipids  As since March he has stopped cholesterol meds The cholesterol med was taking his energy;   Recommending resources for in home and other for spouse so he can re-engaged in exercise and community activity .   Given information on chol restricted diet.   During the course of the visit the patient was educated and counseled about the following appropriate screening and preventive services:   Vaccines to include Pneumoccal, Influenza, Hepatitis B, Td, Zostavax, HCV/ declines today but will check on Tdap this year;   Postponed flu until he can take the high does flu shot  Electrocardiogram- deferred to cardiology   Cardiovascular Disease/ no HTN;   Colorectal cancer screening aged out  Diabetes screening/ discussed pre diabetes  Glaucoma screening/ Fernando Walker  Nutrition counseling / rechecking lipids   Patient Instructions (the written plan) was given to the patient.   Wynetta Fines, RN  06/08/2016    Medical screening examination/treatment/procedure(s) were performed by non-physician practitioner and as supervising physician I was immediately  available for consultation/collaboration. I agree with above. Binnie Rail, MD

## 2016-06-08 ENCOUNTER — Ambulatory Visit (INDEPENDENT_AMBULATORY_CARE_PROVIDER_SITE_OTHER): Payer: Medicare Other

## 2016-06-08 ENCOUNTER — Telehealth: Payer: Self-pay

## 2016-06-08 VITALS — Ht 75.0 in | Wt 210.0 lb

## 2016-06-08 DIAGNOSIS — R7309 Other abnormal glucose: Secondary | ICD-10-CM

## 2016-06-08 DIAGNOSIS — Z0000 Encounter for general adult medical examination without abnormal findings: Secondary | ICD-10-CM | POA: Diagnosis not present

## 2016-06-08 DIAGNOSIS — E785 Hyperlipidemia, unspecified: Secondary | ICD-10-CM

## 2016-06-08 NOTE — Telephone Encounter (Signed)
Cmp, a1c, lipid panel ordered

## 2016-06-08 NOTE — Patient Instructions (Addendum)
Fernando Walker , Thank you for taking time to come for your Medicare Wellness Visit. I appreciate your ongoing commitment to your health goals. Please review the following plan we discussed and let me know if I can assist you in the future.   Educated regarding prediabetes and numbers;  A1c ranges from 5.8 to 6.5 or fasting Blood sugar > 115 -126; (126 is diabetic)   Risk: >80yo; family hx; overweight or obese; African American; Hispanic; Latino; American [place]; [place] American; [place]; history of diabetes when pregnant; or birth to a baby weighing over 9 lbs. Being less physically active than 30 minutes; 3 times a week;   Prevention; Losing a modest 7 to 8 lbs; If over 200 lbs; 10 to 14 lbs;  Choose healthier foods; colorful veggies; fish or lean meats; drinks water Reduce portion size Start exercising; 30 minutes of fast walking x 30 minutes per day/ 60 min for weight loss   [employer]; [phone] [facility]; [phone] - ask about Adult Day care; "[facility]"   Caregiver support group and information regarding [place] is at the; [employer] Address: [address] [place], [place] [address]  Phone: [phone]   Will have Tdap this years  Educated to check with insurance regarding coverage of Shingles vaccination on Part D or Part B and may have lower co-pay if provided on the Part D side  Will take flu in Sept/ oct and will take the high does flu shot     These are the goals we discussed: Goals    . patient          Can review teepa snow online  alzheimers association has the 24/7 helpline  [employer]  [employer]; [phone] Senior Directory; Information regarding Long Term Care  [place] [facility] on [place]        This is a list of the screening recommended for you and due dates:  Health Maintenance  Topic Date Due  . Tetanus Vaccine   02/10/1952  . Shingles Vaccine  02/11/1995  . Flu Shot  05/18/2016  . Pneumonia vaccines  Completed   Fat and Cholesterol Restricted Diet High levels of fat and cholesterol in your blood may lead to various health problems, such as diseases of the heart, blood vessels, gallbladder, liver, and pancreas. Fats are concentrated sources of energy that come in various forms. Certain types of fat, including saturated fat, may be harmful in excess. Cholesterol is a substance needed by your body in small amounts. Your body makes all the cholesterol it needs. Excess cholesterol comes from the food you eat. When you have high levels of cholesterol and saturated fat in your blood, health problems can develop because the excess fat and cholesterol will gather along the walls of your blood vessels, causing them to narrow. Choosing the right foods will help you control your intake of fat and cholesterol. This will help keep the levels of these substances in your blood within normal limits and reduce your risk of disease. WHAT IS MY PLAN? Your health care provider recommends that you:  Get no more than __________ % of the total calories in your daily diet from fat.  Limit your intake of saturated fat to less than ______% of your total calories each day.  Limit the amount of cholesterol in your diet to less than _________mg per day. WHAT TYPES OF FAT SHOULD I CHOOSE?  Choose healthy fats more  often. Choose monounsaturated and polyunsaturated fats, such as olive and canola oil, flaxseeds, walnuts, almonds, and seeds.  Eat more omega-3 fats. Good choices include salmon, mackerel, sardines, tuna, flaxseed oil, and ground flaxseeds. Aim to eat fish at least two times a week.  Limit saturated fats. Saturated fats are primarily found in animal products, such as meats, butter, and cream. Plant sources of saturated fats include palm oil, palm kernel oil, and coconut oil.  Avoid foods with partially hydrogenated oils  in them. These contain trans fats. Examples of foods that contain trans fats are stick margarine, some tub margarines, cookies, crackers, and other baked goods. WHAT GENERAL GUIDELINES DO I NEED TO FOLLOW? These guidelines for healthy eating will help you control your intake of fat and cholesterol:  Check food labels carefully to identify foods with trans fats or high amounts of saturated fat.  Fill one half of your plate with vegetables and green salads.  Fill one fourth of your plate with whole grains. Look for the word "whole" as the first word in the ingredient list.  Fill one fourth of your plate with lean protein foods.  Limit fruit to two servings a day. Choose fruit instead of juice.  Eat more foods that contain soluble fiber. Examples of foods that contain this type of fiber are apples, broccoli, carrots, beans, peas, and barley. Aim to get 20-30 g of fiber per day.  Eat more home-cooked food and less restaurant, buffet, and fast food.  Limit or avoid alcohol.  Limit foods high in starch and sugar.  Limit fried foods.  Cook foods using methods other than frying. Baking, boiling, grilling, and broiling are all great options.  Lose weight if you are overweight. Losing just 5-10% of your initial body weight can help your overall health and prevent diseases such as diabetes and heart disease. WHAT FOODS CAN I EAT? Grains Whole grains, such as whole wheat or whole grain breads, crackers, cereals, and pasta. Unsweetened oatmeal, bulgur, barley, quinoa, or brown rice. Corn or whole wheat flour tortillas. Vegetables Fresh or frozen vegetables (raw, steamed, roasted, or grilled). Green salads. Fruits All fresh, canned (in natural juice), or frozen fruits. Meat and Other Protein Products Ground beef (85% or leaner), grass-fed beef, or beef trimmed of fat. Skinless chicken or [place]. Ground chicken or [place]. Pork trimmed of fat. All fish and seafood. Eggs. Dried beans, peas, or  lentils. Unsalted nuts or seeds. Unsalted canned or dry beans. Dairy Low-fat dairy products, such as skim or 1% milk, 2% or reduced-fat cheeses, low-fat ricotta or cottage cheese, or plain low-fat yogurt. Fats and Oils Tub margarines without trans fats. Light or reduced-fat mayonnaise and salad dressings. Avocado. Olive, canola, sesame, or safflower oils. Natural peanut or almond butter (choose ones without added sugar and oil). The items listed above may not be a complete list of recommended foods or beverages. Contact your dietitian for more options. WHAT FOODS ARE NOT RECOMMENDED? Grains White bread. White pasta. White rice. Cornbread. Bagels, pastries, and croissants. Crackers that contain trans fat. Vegetables White potatoes. Corn. Creamed or fried vegetables. Vegetables in a cheese sauce. Fruits Dried fruits. Canned fruit in light or heavy syrup. Fruit juice. Meat and Other Protein Products Fatty cuts of meat. Ribs, chicken wings, bacon, sausage, bologna, salami, chitterlings, fatback, hot dogs, bratwurst, and packaged luncheon meats. Liver and organ meats. Dairy Whole or 2% milk, cream, half-and-half, and cream cheese. Whole milk cheeses. Whole-fat or sweetened yogurt. Full-fat cheeses. Nondairy creamers and whipped toppings.  Processed cheese, cheese spreads, or cheese curds. Sweets and Desserts Corn syrup, sugars, honey, and molasses. Candy. Jam and jelly. Syrup. Sweetened cereals. Cookies, pies, cakes, donuts, muffins, and ice cream. Fats and Oils Butter, stick margarine, lard, shortening, ghee, or bacon fat. Coconut, palm kernel, or palm oils. Beverages Alcohol. Sweetened drinks (such as sodas, lemonade, and fruit drinks or punches). The items listed above may not be a complete list of foods and beverages to avoid. Contact your dietitian for more information.   This information is not intended to replace advice given to you by your health care provider. Make sure you discuss any  questions you have with your health care provider.   Document Released: 10/04/2005 Document Revised: 10/25/2014 Document Reviewed: 01/02/2014 Elsevier Interactive Patient Education 2016 [place] for Type 2 Diabetes Screening is a way to check for type 2 diabetes in people who do not have symptoms of the disease, but who may likely develop diabetes in the future. Diabetes can lead to serious health problems, but finding diabetes early allows for early treatment. DIABETES RISK FACTORS   Family history of diabetes.  Diseases of the pancreas.  Obesity or being overweight.  Certain racial or ethnic groups:  American [place].  Pacific Islander.  Hispanic.  Asian.  African American.  High blood pressure (hypertension).  History of diabetes while pregnant (gestational diabetes).  Delivering a baby that weighed over 9 pounds.  Being inactive.  High cholesterol or triglycerides.  Age, especially over 80 years of age.  Other diseases or conditions.  Diseases of the pancreas.  Cardiovascular disease.  Disorders of the endocrine system.  Certain medicines, such as those that treat high blood cholesterol levels. WHO IS SCREENED Adults  Adults who have no risk factors and no symptoms should be screened starting at age 44. If the screening tests are normal, they should be repeated every 3 years.  Adults who do not have symptoms, but have 1 or more risk factors, should be screened.  Adults who have 2 or more risk factors may be screened every year.  Adults who have an A1c (3 month average of blood glucose) greater than 5.7% or who had an impaired glucose tolerance (IGT) or impaired fasting glucose (IFG) on a previous test should be screened.  Pregnant women who have risk factors should be screened at their first prenatal visit.  Women who have given birth and had gestational diabetes should be screened 6-12 weeks after the child is born. This  screening should be repeated every 1-3 years after the first test. Children or Adolescents  Children and adolescents should be screened for type 2 diabetes if they are overweight and have 2 of the following risk factors:  Having a family history of type 2 diabetes.  Being a member of a high risk race or ethnic group.  Having signs of insulin resistance or conditions associated with insulin resistance.  Having a mother who had gestational diabetes while pregnant with him or her.  Screening should start at age 70 or at the onset of puberty, whichever comes first. This should be repeated every 2 years. SCREENING In a screening, your caregiver may:  Ask questions about your overall health. This will include questions about the health of close family members, too.  Ask about any diabetes-like symptoms you may have.  Perform a physical exam.  Order some tests that may include:  A fasting plasma glucose test. This measures the level of glucose in your blood.  It is done after you have had nothing to eat but water (fasted) for 8 hours.  A random blood glucose test. This test is done without the need to fast.  An oral glucose tolerance test. This is a blood test done in 2 parts. First, a blood sample is taken after you have fasted. Then, another sample is taken after you drink a liquid that contains a lot of sugar.  An A1c test. This test shows how much glucose has been in your blood over the past 2 to 3 months.   This information is not intended to replace advice given to you by your health care provider. Make sure you discuss any questions you have with your health care provider.   Document Released: 07/31/2009 Document Revised: 10/25/2014 Document Reviewed: 05/12/2011 Elsevier Interactive Patient Education 2016 [place] in the Home  Falls can cause injuries. They can happen to people of all ages. There are many things you can do to make your home safe and to  help prevent falls.  WHAT CAN I DO ON THE OUTSIDE OF MY HOME?  Regularly fix the edges of walkways and driveways and fix any cracks.  Remove anything that might make you trip as you walk through a door, such as a raised step or threshold.  Trim any bushes or trees on the path to your home.  Use bright outdoor lighting.  Clear any walking paths of anything that might make someone trip, such as rocks or tools.  Regularly check to see if handrails are loose or broken. Make sure that both sides of any steps have handrails.  Any raised decks and porches should have guardrails on the edges.  Have any leaves, snow, or ice cleared regularly.  Use sand or salt on walking paths during winter.  Clean up any spills in your garage right away. This includes oil or grease spills. WHAT CAN I DO IN THE BATHROOM?   Use night lights.  Install grab bars by the toilet and in the tub and shower. Do not use towel bars as grab bars.  Use non-skid mats or decals in the tub or shower.  If you need to sit down in the shower, use a plastic, non-slip stool.  Keep the floor dry. Clean up any water that spills on the floor as soon as it happens.  Remove soap buildup in the tub or shower regularly.  Attach bath mats securely with double-sided non-slip rug tape.  Do not have throw rugs and other things on the floor that can make you trip. WHAT CAN I DO IN THE BEDROOM?  Use night lights.  Make sure that you have a light by your bed that is easy to reach.  Do not use any sheets or blankets that are too big for your bed. They should not hang down onto the floor.  Have a firm chair that has side arms. You can use this for support while you get dressed.  Do not have throw rugs and other things on the floor that can make you trip. WHAT CAN I DO IN THE KITCHEN?  Clean up any spills right away.  Avoid walking on wet floors.  Keep items that you use a lot in easy-to-reach places.  If you need to  reach something above you, use a strong step stool that has a grab bar.  Keep electrical cords out of the way.  Do not use floor polish or wax that makes floors slippery.  If you must use wax, use non-skid floor wax.  Do not have throw rugs and other things on the floor that can make you trip. WHAT CAN I DO WITH MY STAIRS?  Do not leave any items on the stairs.  Make sure that there are handrails on both sides of the stairs and use them. Fix handrails that are broken or loose. Make sure that handrails are as long as the stairways.  Check any carpeting to make sure that it is firmly attached to the stairs. Fix any carpet that is loose or worn.  Avoid having throw rugs at the top or bottom of the stairs. If you do have throw rugs, attach them to the floor with carpet tape.  Make sure that you have a light switch at the top of the stairs and the bottom of the stairs. If you do not have them, ask someone to add them for you. WHAT ELSE CAN I DO TO HELP PREVENT FALLS?  Wear shoes that:  Do not have high heels.  Have rubber bottoms.  Are comfortable and fit you well.  Are closed at the toe. Do not wear sandals.  If you use a stepladder:  Make sure that it is fully opened. Do not climb a closed stepladder.  Make sure that both sides of the stepladder are locked into place.  Ask someone to hold it for you, if possible.  Clearly mark and make sure that you can see:  Any grab bars or handrails.  First and last steps.  Where the edge of each step is.  Use tools that help you move around (mobility aids) if they are needed. These include:  Canes.  Walkers.  Scooters.  Crutches.  Turn on the lights when you go into a dark area. Replace any light bulbs as soon as they burn out.  Set up your furniture so you have a clear path. Avoid moving your furniture around.  If any of your floors are uneven, fix them.  If there are any pets around you, be aware of where they  are.  Review your medicines with your doctor. Some medicines can make you feel dizzy. This can increase your chance of falling. Ask your doctor what other things that you can do to help prevent falls.   This information is not intended to replace advice given to you by your health care provider. Make sure you discuss any questions you have with your health care provider.   Document Released: 07/31/2009 Document Revised: 02/18/2015 Document Reviewed: 11/08/2014 Elsevier Interactive Patient Education 2016 [place] Maintenance, Male A healthy lifestyle and preventative care can promote health and wellness.  Maintain regular health, dental, and eye exams.  Eat a healthy diet. Foods like vegetables, fruits, whole grains, low-fat dairy products, and lean protein foods contain the nutrients you need and are low in calories. Decrease your intake of foods high in solid fats, added sugars, and salt. Get information about a proper diet from your health care provider, if necessary.  Regular physical exercise is one of the most important things you can do for your health. Most adults should get at least 150 minutes of moderate-intensity exercise (any activity that increases your heart rate and causes you to sweat) each week. In addition, most adults need muscle-strengthening exercises on 2 or more days a week.   Maintain a healthy weight. The body mass index (BMI) is a screening tool to identify possible weight problems. It provides an estimate of  body fat based on height and weight. Your health care provider can find your BMI and can help you achieve or maintain a healthy weight. For males 20 years and older:  A BMI below 18.5 is considered underweight.  A BMI of 18.5 to 24.9 is normal.  A BMI of 25 to 29.9 is considered overweight.  A BMI of 30 and above is considered obese.  Maintain normal blood lipids and cholesterol by exercising and minimizing your intake of saturated fat. Eat a  balanced diet with plenty of fruits and vegetables. Blood tests for lipids and cholesterol should begin at age 81 and be repeated every 5 years. If your lipid or cholesterol levels are high, you are over age 78, or you are at high risk for heart disease, you may need your cholesterol levels checked more frequently.Ongoing high lipid and cholesterol levels should be treated with medicines if diet and exercise are not working.  If you smoke, find out from your health care provider how to quit. If you do not use tobacco, do not start.  Lung cancer screening is recommended for adults aged 42-80 years who are at high risk for developing lung cancer because of a history of smoking. A yearly low-dose CT scan of the lungs is recommended for people who have at least a 30-pack-year history of smoking and are current smokers or have quit within the past 15 years. A pack year of smoking is smoking an average of 1 pack of cigarettes a day for 1 year (for example, a 30-pack-year history of smoking could mean smoking 1 pack a day for 30 years or 2 packs a day for 15 years). Yearly screening should continue until the smoker has stopped smoking for at least 15 years. Yearly screening should be stopped for people who develop a health problem that would prevent them from having lung cancer treatment.  If you choose to drink alcohol, do not have more than 2 drinks per day. One drink is considered to be 12 oz (360 mL) of beer, 5 oz (150 mL) of wine, or 1.5 oz (45 mL) of liquor.  Avoid the use of street drugs. Do not share needles with anyone. Ask for help if you need support or instructions about stopping the use of drugs.  High blood pressure causes heart disease and increases the risk of stroke. High blood pressure is more likely to develop in:  People who have blood pressure in the end of the normal range (100-139/85-89 mm Hg).  People who are overweight or obese.  People who are African American.  If you are 46-36  years of age, have your blood pressure checked every 3-5 years. If you are 73 years of age or older, have your blood pressure checked every year. You should have your blood pressure measured twice--once when you are at a hospital or clinic, and once when you are not at a hospital or clinic. Record the average of the two measurements. To check your blood pressure when you are not at a hospital or clinic, you can use:  An automated blood pressure machine at a pharmacy.  A home blood pressure monitor.  If you are 16-67 years old, ask your health care provider if you should take aspirin to prevent heart disease.  Diabetes screening involves taking a blood sample to check your fasting blood sugar level. This should be done once every 3 years after age 60 if you are at a normal weight and without risk factors for  diabetes. Testing should be considered at a younger age or be carried out more frequently if you are overweight and have at least 1 risk factor for diabetes.  Colorectal cancer can be detected and often prevented. Most routine colorectal cancer screening begins at the age of 71 and continues through age 31. However, your health care provider may recommend screening at an earlier age if you have risk factors for colon cancer. On a yearly basis, your health care provider may provide home test kits to check for hidden blood in the stool. A small camera at the end of a tube may be used to directly examine the colon (sigmoidoscopy or colonoscopy) to detect the earliest forms of colorectal cancer. Talk to your health care provider about this at age 8 when routine screening begins. A direct exam of the colon should be repeated every 5-10 years through age 64, unless early forms of precancerous polyps or small growths are found.  People who are at an increased risk for hepatitis B should be screened for this virus. You are considered at high risk for hepatitis B if:  You were born in a country where  hepatitis B occurs often. Talk with your health care provider about which countries are considered high risk.  Your parents were born in a high-risk country and you have not received a shot to protect against hepatitis B (hepatitis B vaccine).  You have HIV or AIDS.  You use needles to inject street drugs.  You live with, or have sex with, someone who has hepatitis B.  You are a man who has sex with other men (MSM).  You get hemodialysis treatment.  You take certain medicines for conditions like cancer, organ transplantation, and autoimmune conditions.  Hepatitis C blood testing is recommended for all people born from [birth year] through 1963 and any individual with known risk factors for hepatitis C.  Healthy men should no longer receive prostate-specific antigen (PSA) blood tests as part of routine cancer screening. Talk to your health care provider about prostate cancer screening.  Testicular cancer screening is not recommended for adolescents or adult males who have no symptoms. Screening includes self-exam, a health care provider exam, and other screening tests. Consult with your health care provider about any symptoms you have or any concerns you have about testicular cancer.  Practice safe sex. Use condoms and avoid high-risk sexual practices to reduce the spread of sexually transmitted infections (STIs).  You should be screened for STIs, including gonorrhea and chlamydia if:  You are sexually active and are younger than 24 years.  You are older than 24 years, and your health care provider tells you that you are at risk for this type of infection.  Your sexual activity has changed since you were last screened, and you are at an increased risk for chlamydia or gonorrhea. Ask your health care provider if you are at risk.  If you are at risk of being infected with HIV, it is recommended that you take a prescription medicine daily to prevent HIV infection. This is called pre-exposure  prophylaxis (PrEP). You are considered at risk if:  You are a man who has sex with other men (MSM).  You are a heterosexual man who is sexually active with multiple partners.  You take drugs by injection.  You are sexually active with a partner who has HIV.  Talk with your health care provider about whether you are at high risk of being infected with HIV. If you choose to  begin PrEP, you should first be tested for HIV. You should then be tested every 3 months for as long as you are taking PrEP.  Use sunscreen. Apply sunscreen liberally and repeatedly throughout the day. You should seek shade when your shadow is shorter than you. Protect yourself by wearing long sleeves, pants, a wide-brimmed hat, and sunglasses year round whenever you are outdoors.  Tell your health care provider of new moles or changes in moles, especially if there is a change in shape or color. Also, tell your health care provider if a mole is larger than the size of a pencil eraser.  A one-time screening for abdominal aortic aneurysm (AAA) and surgical repair of large AAAs by ultrasound is recommended for men aged 47-75 years who are current or former smokers.  Stay current with your vaccines (immunizations).   This information is not intended to replace advice given to you by your health care provider. Make sure you discuss any questions you have with your health care provider.   Document Released: 04/01/2008 Document Revised: 10/25/2014 Document Reviewed: 03/01/2011 Elsevier Interactive Patient Education [employer].

## 2016-06-08 NOTE — Telephone Encounter (Signed)
This patient lipid ratio has been good for several years. Due to c/o of fatigue and muscle issues, has stopped his cholesterol med as of March; Cardiology requested he stay on it, but feels much better without it.  Request repeat lipids prior to seeing his cardiologist in Nov. Also has not had A1c since 2016; his average was (6.2) c/o of some "tingling" in feet.  Please advise. Tks

## 2016-06-10 NOTE — Telephone Encounter (Signed)
Call to let the patient know Dr. Quay Burow ordered his labs as we discussed. NPO except for black coffee or water  LVM

## 2016-06-14 ENCOUNTER — Other Ambulatory Visit: Payer: Self-pay

## 2016-06-17 ENCOUNTER — Other Ambulatory Visit (INDEPENDENT_AMBULATORY_CARE_PROVIDER_SITE_OTHER): Payer: Medicare Other

## 2016-06-17 DIAGNOSIS — E785 Hyperlipidemia, unspecified: Secondary | ICD-10-CM | POA: Diagnosis not present

## 2016-06-17 DIAGNOSIS — R7309 Other abnormal glucose: Secondary | ICD-10-CM | POA: Diagnosis not present

## 2016-06-17 LAB — COMPREHENSIVE METABOLIC PANEL
ALBUMIN: 3.9 g/dL (ref 3.5–5.2)
ALK PHOS: 41 U/L (ref 39–117)
ALT: 16 U/L (ref 0–53)
AST: 17 U/L (ref 0–37)
BILIRUBIN TOTAL: 0.7 mg/dL (ref 0.2–1.2)
BUN: 16 mg/dL (ref 6–23)
CO2: 30 mEq/L (ref 19–32)
Calcium: 8.7 mg/dL (ref 8.4–10.5)
Chloride: 106 mEq/L (ref 96–112)
Creatinine, Ser: 1.09 mg/dL (ref 0.40–1.50)
GFR: 68.95 mL/min (ref >60.00)
Glucose, Bld: 112 mg/dL — ABNORMAL HIGH (ref 70–99)
POTASSIUM: 4.1 meq/L (ref 3.5–5.1)
Sodium: 142 mEq/L (ref 135–145)
TOTAL PROTEIN: 6.8 g/dL (ref 6.0–8.3)

## 2016-06-17 LAB — LIPID PANEL
CHOLESTEROL: 199 mg/dL (ref 0–200)
HDL: 54.7 mg/dL (ref >39.00)
LDL Cholesterol: 128 mg/dL — ABNORMAL HIGH (ref 0–99)
NONHDL: 143.97
Total CHOL/HDL Ratio: 4
Triglycerides: 79 mg/dL (ref 0.0–149.0)
VLDL: 15.8 mg/dL (ref 0.0–40.0)

## 2016-06-17 LAB — HEMOGLOBIN A1C: HEMOGLOBIN A1C: 5.8 % (ref 4.6–6.5)

## 2016-06-18 ENCOUNTER — Encounter: Payer: Self-pay | Admitting: Internal Medicine

## 2016-06-18 DIAGNOSIS — E785 Hyperlipidemia, unspecified: Secondary | ICD-10-CM

## 2016-06-22 MED ORDER — SIMVASTATIN 20 MG PO TABS: 20.0000 mg | ORAL_TABLET | Freq: Every day | ORAL | 3 refills | Status: DC

## 2016-07-23 DIAGNOSIS — Z23 Encounter for immunization: Secondary | ICD-10-CM | POA: Diagnosis not present

## 2016-08-27 ENCOUNTER — Other Ambulatory Visit (INDEPENDENT_AMBULATORY_CARE_PROVIDER_SITE_OTHER): Payer: Medicare Other

## 2016-08-27 DIAGNOSIS — E785 Hyperlipidemia, unspecified: Secondary | ICD-10-CM | POA: Diagnosis not present

## 2016-08-27 LAB — LIPID PANEL
CHOLESTEROL: 149 mg/dL (ref 0–200)
HDL: 67.4 mg/dL (ref >39.00)
LDL CALC: 70 mg/dL (ref 0–99)
NONHDL: 81.85
Total CHOL/HDL Ratio: 2
Triglycerides: 58 mg/dL (ref 0.0–149.0)
VLDL: 11.6 mg/dL (ref 0.0–40.0)

## 2016-08-27 LAB — COMPREHENSIVE METABOLIC PANEL
ALT: 21 U/L (ref 0–53)
AST: 21 U/L (ref 0–37)
Albumin: 4.4 g/dL (ref 3.5–5.2)
Alkaline Phosphatase: 38 U/L — ABNORMAL LOW (ref 39–117)
BUN: 17 mg/dL (ref 6–23)
CHLORIDE: 103 meq/L (ref 96–112)
CO2: 28 mEq/L (ref 19–32)
Calcium: 9.7 mg/dL (ref 8.4–10.5)
Creatinine, Ser: 1.09 mg/dL (ref 0.40–1.50)
GFR: 68.91 mL/min (ref >60.00)
GLUCOSE: 106 mg/dL — AB (ref 70–99)
POTASSIUM: 4.1 meq/L (ref 3.5–5.1)
SODIUM: 141 meq/L (ref 135–145)
Total Bilirubin: 1.6 mg/dL — ABNORMAL HIGH (ref 0.2–1.2)
Total Protein: 7.4 g/dL (ref 6.0–8.3)

## 2016-08-28 ENCOUNTER — Encounter: Payer: Self-pay | Admitting: Internal Medicine

## 2016-09-07 ENCOUNTER — Ambulatory Visit (HOSPITAL_COMMUNITY)
Admission: RE | Admit: 2016-09-07 | Discharge: 2016-09-07 | Disposition: A | Discharge status: Home / Self Care | Payer: Medicare Other | Source: Ambulatory Visit | Attending: Cardiovascular Disease | Admitting: Cardiovascular Disease

## 2016-09-07 DIAGNOSIS — I6523 Occlusion and stenosis of bilateral carotid arteries: Secondary | ICD-10-CM | POA: Diagnosis not present

## 2016-09-07 DIAGNOSIS — E119 Type 2 diabetes mellitus without complications: Secondary | ICD-10-CM | POA: Insufficient documentation

## 2016-09-07 DIAGNOSIS — I2510 Atherosclerotic heart disease of native coronary artery without angina pectoris: Secondary | ICD-10-CM | POA: Diagnosis not present

## 2016-09-07 DIAGNOSIS — I10 Essential (primary) hypertension: Secondary | ICD-10-CM | POA: Diagnosis not present

## 2016-09-07 DIAGNOSIS — E785 Hyperlipidemia, unspecified: Secondary | ICD-10-CM | POA: Insufficient documentation

## 2016-09-07 DIAGNOSIS — Z87891 Personal history of nicotine dependence: Secondary | ICD-10-CM | POA: Insufficient documentation

## 2016-09-07 DIAGNOSIS — Z951 Presence of aortocoronary bypass graft: Secondary | ICD-10-CM | POA: Insufficient documentation

## 2016-09-08 ENCOUNTER — Telehealth: Payer: Self-pay | Admitting: Cardiovascular Disease

## 2016-09-08 NOTE — Telephone Encounter (Signed)
I spoke with pt and reviewed carotid doppler results with him.  

## 2016-09-08 NOTE — Telephone Encounter (Signed)
New message   Pt verbalized that he is calling for his carotid results

## 2016-09-29 ENCOUNTER — Encounter: Payer: Self-pay | Admitting: Internal Medicine

## 2016-11-12 ENCOUNTER — Ambulatory Visit (INDEPENDENT_AMBULATORY_CARE_PROVIDER_SITE_OTHER): Payer: Medicare Other | Admitting: Cardiovascular Disease

## 2016-11-12 ENCOUNTER — Encounter: Payer: Self-pay | Admitting: Cardiovascular Disease

## 2016-11-12 VITALS — BP 114/70 | HR 70 | Ht 74.0 in | Wt 207.2 lb

## 2016-11-12 DIAGNOSIS — I10 Essential (primary) hypertension: Secondary | ICD-10-CM | POA: Diagnosis not present

## 2016-11-12 DIAGNOSIS — I2510 Atherosclerotic heart disease of native coronary artery without angina pectoris: Secondary | ICD-10-CM | POA: Diagnosis not present

## 2016-11-12 DIAGNOSIS — I350 Nonrheumatic aortic (valve) stenosis: Secondary | ICD-10-CM

## 2016-11-12 DIAGNOSIS — E7800 Pure hypercholesterolemia, unspecified: Secondary | ICD-10-CM

## 2016-11-12 DIAGNOSIS — I6523 Occlusion and stenosis of bilateral carotid arteries: Secondary | ICD-10-CM

## 2016-11-12 NOTE — Progress Notes (Signed)
Chief Complaint  Patient presents with  . Follow-up    1 year   History of Present Illness: 83 yo male with history of CAD, HTN, HLD here today for cardiac follow up. He has been followed in the past by Dr. Verl Blalock. He underwent 7V CABG in 1995. Last cath 2010 with 7 patents grafts, total proximal occlusion of all native vessels. Carotid artery dopplers October 2015 with mild bilateral disease. Stress myoview 06/27/13 in setting of dyspnea with inferior wall scar with peri-infarct ischemia, intermediate risk study. I saw him in f/u and he stated that he did not have chest pain. His dyspnea was stable. He did not wish to pursue a cardiac cath. Echo 10/30/14 with normal LV function, mild AS.   He is here today for follow up. No chest pain or SOB. No dizziness, near syncope or syncope. He feels well.   Primary Care Physician: Binnie Rail, MD  Past Medical History:  Diagnosis Date  . CAD (coronary artery disease) of artery bypass graft   . Carotid arterial disease (HCC)    Nonobstructive  . Cataract   . Dyslipidemia   . GERD (gastroesophageal reflux disease)   . 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 Hyperlipidemia   . Hypertension   . Hypertrophy of prostate with urinary obstruction and other lower urinary tract symptoms (LUTS)   . Pneumonia    As a child  . Pneumonia    as child    Past Surgical History:  Procedure Laterality Date  . cataracts     both eyes  . COLONOSCOPY    . colonoscopy with polypectomy  2004  . CORONARY ARTERY BYPASS GRAFT  1995   X 7  . POLYPECTOMY      Current Outpatient Prescriptions  Medication Sig Dispense Refill  . aspirin 81 MG tablet Take 81 mg by mouth daily.      . bimatoprost (LUMIGAN) 0.03 % ophthalmic solution Place 1 drop into both eyes at bedtime.     . Cyanocobalamin (VITAMIN B-12 PO) Take 500 mg by mouth daily.    . simvastatin (ZOCOR) 20 MG tablet Take 1 tablet (20 mg total) by mouth at bedtime. 90 tablet 3   No current facility-administered  medications for this visit.     Allergies  Allergen Reactions  . Sulfonamide Derivatives     rash    Social History   Social History  . Marital status: Married    Spouse name: N/A  . Number of children: N/A  . Years of education: N/A   Occupational History  . Not on file.   Social History Main Topics  . Smoking status: Former Smoker    Quit date: 10/18/1984  . Smokeless tobacco: Former Systems developer  . Alcohol use 4.2 oz/week    7 Glasses of wine per week  . Drug use: No  . Sexual activity: Not on file   Other Topics Concern  . Not on file   Social History Narrative  . No narrative on file    Family History  Problem Relation Age of Onset  . Stroke Father 81  . Diabetes Neg Hx   . Cancer Neg Hx   . GER disease Neg Hx   . Heart attack Neg Hx   . Colon cancer Neg Hx   . Esophageal cancer Neg Hx   . Stomach cancer Neg Hx   . Heart disease Neg Hx     Review of Systems:  As stated in the HPI and  otherwise negative.   BP 114/70   Pulse 70   Ht 6\' 2"  (1.88 m)   Wt 207 lb 3.2 oz (94 kg)   BMI 26.60 kg/m   Physical Examination: General: Well developed, well nourished, NAD  HEENT: OP clear, mucus membranes moist  SKIN: warm, dry. No rashes. Neuro: No focal deficits  Musculoskeletal: Muscle strength 5/5 all ext  Psychiatric: Mood and affect normal  Neck: No JVD, no carotid bruits, no thyromegaly, no lymphadenopathy.  Lungs:Clear bilaterally, no wheezes, rhonci, crackles Cardiovascular: Regular rate and rhythm. Systolic murmurs. No gallops or rubs. Abdomen:Soft. Bowel sounds present. Non-tender.  Extremities: No lower extremity edema. Pulses are 2 + in the bilateral DP/PT.  Echo 10/30/14: - Left ventricle: The cavity size was normal. Wall thickness was normal. Systolic function was normal. The estimated ejection fraction was in the range of 55% to 60%. - Aortic valve: There was very mild stenosis. Mean gradient (S): 12 mm Hg. Peak gradient (S): 21 mm Hg. -  Mitral valve: Calcified annulus. Mildly thickened leaflets . - Left atrium: The atrium was mildly dilated. - Atrial septum: No defect or patent foramen ovale was identified.  Stress myoview 06/27/13: Stress Procedure: The patient received IV Lexiscan 0.4 mg over 15-seconds with concurrent low level exercise and then Technetium 68m Sestamibi was injected at 30-seconds while the patient continued walking one more minute. This patient was sob and had chest pain with the Lexiscan injection. Quantitative spect images were obtained after a 45-minute delay.  Stress ECG: No significant change from baseline ECG  QPS  Raw Data Images: Normal; no motion artifact; normal heart/lung ratio.  Stress Images: There is partial reversibility in this area.  Rest Images: There is decreased uptake in the inferior wall.  Subtraction (SDS): There is scar involving apical inferior, mid inferior, basal inferior and inferoseptal wall with moderate size severe periinfarct ischemia.  Transient Ischemic Dilatation (Normal <1.22): n/a  Lung/Heart Ratio (Normal <0.45): 0.33  Quantitative Gated Spect Images  QGS EDV: 93 ml  QGS ESV: 48 ml  Impression  Exercise Capacity: Fair exercise capacity.  BP Response: Normal blood pressure response.  Clinical Symptoms: Typical chest pain.  ECG Impression: No significant ST segment change suggestive of ischemia.  Comparison with Prior Nuclear Study: No images to compare  Overall Impression: Intermediate risk stress nuclear study with a prior infarct in the RCA territory with moderate size severe severity periinfarct ischemia (SDS 7). .  LV Ejection Fraction: 49%. LV Wall Motion: Paradoxical septal motion and hypokinesis in the inferior wall.   Cardiac cath 05/01/09:  The left main coronary artery was free of significant disease.  The left anterior descending artery was completely occluded after 2  septal perforators in its proximal portion.  The circumflex artery was completely  occluded in its midportion. There  is a 90% proximal stenosis. The distal vessel filled faintly via  bridging collaterals.  The right coronary artery was completely occluded near its origin.  The saphenous vein graft to the acute marginal and posterior ascending  branch of the right coronary artery were patent. There was a filling  defect in the mid-to-distal portion of the graft, which did not appear  to be obstructive. There was also a 30-40% narrowing in midportion of  the graft just after the attachment to the acute marginal branch.  The LIMA graft to LAD was patent and functioned normally.  The sequential graft to the marginal and posterolateral branch of  circumflex artery was patent and functioned normally.  There were  minimal luminal irregularities in the graft.  The vein graft to the first and second diagonal branch of the LAD was  patent and functioned normally.  The left ventriculogram performed in the RAO projection showed good wall  motion with no areas of hypokinesis. The estimated fraction was 65%.  HEMODYNAMIC DATA: The aortic pressure was 100/59 with a mean of 77.  The left ventricular pressure was 100/10.  EKG:  EKG is ordered today. The ekg ordered today demonstrates sinus, rate 70 bpm. Non-specific ST and T wave abn. PAC  Recent Labs: 08/27/2016: ALT 21; BUN 17; Creatinine, Ser 1.09; Potassium 4.1; Sodium 141   Lipid Panel    Component Value Date/Time   CHOL 149 08/27/2016 1007   TRIG 58.0 08/27/2016 1007   HDL 67.40 08/27/2016 1007   CHOLHDL 2 08/27/2016 1007   VLDL 11.6 08/27/2016 1007   LDLCALC 70 08/27/2016 1007     Wt Readings from Last 3 Encounters:  11/12/16 207 lb 3.2 oz (94 kg)  06/08/16 210 lb (95.3 kg)  11/03/15 215 lb (97.5 kg)     Other studies Reviewed: Additional studies/ records that were reviewed today include: . Review of the above records demonstrates:   Assessment and Plan:   1. CAD without angina: Pt with known CAD with prior  CABG in 1995. Last cath 2010 with 7 patent grafts. Stress myoview September 2014 is read as an intermediate risk study with inferior wall infarct with peri-infarct ischemia. This is similar to his stress test in 2010 that led to a cardiac cath demonstrating flow in all bypass grafts. He continues to do well. Will continue medical management for now. Will continue ASA. He wants to stop his statin despite me asking him to take it.    2. HTN: BP controlled. No changes.   3. Hyperlipidemia: He had been on a statin and tolerated well. LDL at goal.  4. Carotid artery disease: Mild carotid disease by dopplers November 2017. Will not repeat given age and stability.  5. Aortic stenosis: Mild by echo January 2016.  Repeat echo in January 2019. Will arrange today.   Current medicines are reviewed at length with the patient today.  The patient does not have concerns regarding medicines.  The following changes have been made:  no change  Labs/ tests ordered today include:   Orders Placed This Encounter  Procedures  . EKG 12-Lead  . ECHOCARDIOGRAM COMPLETE    Disposition:   FU with me in 12  months  Signed, Lauree Chandler, MD 11/12/2016 2:55 PM    [facility], [place], [place]  [address] Phone: [phone]; Fax: [phone]

## 2016-11-12 NOTE — Patient Instructions (Signed)
Medication Instructions:  Your physician recommends that you continue on your current medications as directed. Please refer to the Current Medication list given to you today.   Labwork: none  Testing/Procedures: Your physician has requested that you have an echocardiogram. Echocardiography is a painless test that uses sound waves to create images of your heart. It provides your doctor with information about the size and shape of your heart and how well your heart's chambers and valves are working. This procedure takes approximately one hour. There are no restrictions for this procedure. To be done in about 12 months--week or so prior to appointment with Dr. Angelena Form    Follow-Up: Your physician recommends that you schedule a follow-up appointment in: 12 months.  Please call our office in about 9 months to schedule this appointment.     Any Other Special Instructions Will Be Listed Below (If Applicable).     If you need a refill on your cardiac medications before your next appointment, please call your pharmacy.

## 2017-04-04 ENCOUNTER — Encounter: Payer: Self-pay | Admitting: Internal Medicine

## 2017-04-04 DIAGNOSIS — Z125 Encounter for screening for malignant neoplasm of prostate: Secondary | ICD-10-CM

## 2017-04-05 ENCOUNTER — Other Ambulatory Visit (INDEPENDENT_AMBULATORY_CARE_PROVIDER_SITE_OTHER): Payer: Medicare Other

## 2017-04-05 ENCOUNTER — Encounter: Payer: Self-pay | Admitting: Internal Medicine

## 2017-04-05 DIAGNOSIS — Z125 Encounter for screening for malignant neoplasm of prostate: Secondary | ICD-10-CM | POA: Diagnosis not present

## 2017-04-05 LAB — PSA, MEDICARE: PSA: 0.45 ng/ml (ref 0.10–4.00)

## 2017-06-21 ENCOUNTER — Ambulatory Visit (HOSPITAL_COMMUNITY): Payer: Medicare Other

## 2017-06-21 ENCOUNTER — Telehealth: Payer: Self-pay | Admitting: *Deleted

## 2017-06-21 DIAGNOSIS — E7849 Other hyperlipidemia: Secondary | ICD-10-CM

## 2017-06-21 NOTE — Telephone Encounter (Signed)
I spoke with pt and scheduled him for CMET and Lipid profile on day of echo in January 2019

## 2017-06-21 NOTE — Telephone Encounter (Signed)
Received message pt would like to have lab work done same day as echo. I placed call to pt and left message to call back.

## 2017-06-21 NOTE — Telephone Encounter (Signed)
Follow up ° ° °Pt returning your call °

## 2017-07-20 ENCOUNTER — Encounter: Payer: Self-pay | Admitting: Internal Medicine

## 2017-07-20 ENCOUNTER — Ambulatory Visit (INDEPENDENT_AMBULATORY_CARE_PROVIDER_SITE_OTHER): Payer: Medicare Other | Admitting: Internal Medicine

## 2017-07-20 VITALS — BP 152/62 | HR 60 | Temp 98.5°F | Resp 16 | Wt 207.0 lb

## 2017-07-20 DIAGNOSIS — M720 Palmar fascial fibromatosis [Dupuytren]: Secondary | ICD-10-CM | POA: Diagnosis not present

## 2017-07-20 DIAGNOSIS — I10 Essential (primary) hypertension: Secondary | ICD-10-CM

## 2017-07-20 DIAGNOSIS — I6523 Occlusion and stenosis of bilateral carotid arteries: Secondary | ICD-10-CM

## 2017-07-20 DIAGNOSIS — L989 Disorder of the skin and subcutaneous tissue, unspecified: Secondary | ICD-10-CM | POA: Diagnosis not present

## 2017-07-20 DIAGNOSIS — Z23 Encounter for immunization: Secondary | ICD-10-CM

## 2017-07-20 NOTE — Assessment & Plan Note (Signed)
On top of head, concerning because it has not gone away and over a month Skin cancer needs to be ruled out We'll refer to his dermatologist

## 2017-07-20 NOTE — Patient Instructions (Addendum)
Monitor your BP at home  - it should be less 140/90.   Flu immunization administered today.    Medications reviewed and updated.  No changes recommended at this time.   A referral was ordered for hand surgery and dermatology.

## 2017-07-20 NOTE — Progress Notes (Signed)
Subjective:    Patient ID: Fernando Walker, male    DOB: 07-21-1933, 83 y.o.   MRN: [id number]  HPI He is here for an acute visit.   Dupuytren's contracture:  He has a contracute of the left hand - 5th finger. It has been present for years, but is getting worse. It is tender at times and he is decreased range of motion of the fifth digit. He is interested in further evaluation and treatment.    Skin abn;  On top of head that has a skin abnormality that has been there for one month.  It has not changed or gone away. It does scab over at times. He does see a dermatologist for his grovers disease and wonders if he needs to see him.   Hypertension/elevated blood pressure: He is taking his medication daily. He is compliant with a low sodium diet.  He denies chest pain, palpitations, edema, shortness of breath and regular headaches.  He does monitor his blood pressure at home, but not regularly. It has been well controlled.    2-3 years ago he had a tick bite on his leg.  His wife pulled it off.  Some of the tick was left and he saw a doc in a box and it some of it was removed, but it did not get better.  He had a residual area that did not heal.  He saw derm then and had a biopsy and was then diagnosed with the grover's disease.  He was concerned that the tick bites never seemed to go away. He has been bit by 2 other tics since then and they don't seem to heal.   Medications and allergies reviewed with patient and updated if appropriate.  Patient Active Problem List   Diagnosis Date Noted  . 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 Hx of adenomatous colonic polyps 07/27/2011  . Carotid bruit 06/02/2011  . FASTING HYPERGLYCEMIA 11/10/2009  . Essential hypertension 04/29/2009  . CAD, ARTERY BYPASS GRAFT 04/29/2009  . MYOCARDIAL PERFUSION SCAN, WITH STRESS TEST, ABNORMAL 04/29/2009  . Hyperlipidemia 04/24/2009  . HYPERTROPHY PROSTATE W/UR  OBST & OTH LUTS 04/22/2009    Current Outpatient Prescriptions on File Prior to Visit  Medication Sig Dispense Refill  . aspirin 81 MG tablet Take 81 mg by mouth daily.      . bimatoprost (LUMIGAN) 0.03 % ophthalmic solution Place 1 drop into both eyes at bedtime.     . Cyanocobalamin (VITAMIN B-12 PO) Take 500 mg by mouth daily.    . simvastatin (ZOCOR) 20 MG tablet Take 1 tablet (20 mg total) by mouth at bedtime. 90 tablet 3   No current facility-administered medications on file prior to visit.     Past Medical History:  Diagnosis Date  . CAD (coronary artery disease) of artery bypass graft   . Carotid arterial disease (HCC)    Nonobstructive  . Cataract   . Dyslipidemia   . GERD (gastroesophageal reflux disease)   . 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 Hyperlipidemia   . Hypertension   . Hypertrophy of prostate with urinary obstruction and other lower urinary tract symptoms (LUTS)   . Pneumonia    As a child  . Pneumonia    as child    Past Surgical History:  Procedure Laterality Date  . cataracts     both eyes  . COLONOSCOPY    . colonoscopy with polypectomy  2004  . CORONARY  ARTERY BYPASS GRAFT  1995   X 7  . POLYPECTOMY      Social History   Social History  . Marital status: Married    Spouse name: N/A  . Number of children: N/A  . Years of education: N/A   Social History Main Topics  . Smoking status: Former Smoker    Quit date: 10/18/1984  . Smokeless tobacco: Former Systems developer  . Alcohol use 4.2 oz/week    7 Glasses of wine per week  . Drug use: No  . Sexual activity: Not on file   Other Topics Concern  . Not on file   Social History Narrative  . No narrative on file    Family History  Problem Relation Age of Onset  . Stroke Father 79  . Diabetes Neg Hx   . Cancer Neg Hx   . GER disease Neg Hx   . Heart attack Neg Hx   . Colon cancer Neg Hx   . Esophageal cancer Neg Hx   . Stomach cancer Neg Hx   . Heart disease Neg Hx     Review of Systems    Constitutional: Negative for fever.  Respiratory: Negative for shortness of breath.   Cardiovascular: Negative for chest pain, palpitations and leg swelling.  Skin: Positive for color change (skin abn on head).  Neurological: Negative for light-headedness and headaches.       Objective:   Vitals:   07/20/17 1535  BP: (!) 152/62  Pulse: 60  Resp: 16  Temp: 98.5 F (36.9 C)  SpO2: 96%   Filed Weights   07/20/17 1535  Weight: 207 lb (93.9 kg)   Body mass index is 26.58 kg/m.  Wt Readings from Last 3 Encounters:  07/20/17 207 lb (93.9 kg)  11/12/16 207 lb 3.2 oz (94 kg)  06/08/16 210 lb (95.3 kg)     Physical Exam Constitutional: Appears well-developed and well-nourished. No distress.  HENT:  Head: Normocephalic and atraumatic.  Neck: Neck supple. No tracheal deviation present. No thyromegaly present.  No cervical lymphadenopathy Cardiovascular: Normal rate, regular rhythm and normal heart sounds.   No murmur heard. No carotid bruit .  No edema Pulmonary/Chest: Effort normal and breath sounds normal. No respiratory distress. No has no wheezes. No rales.  Musculoskeletal: Contracture left fifth / finger associated with decreased flexion and extension  Skin: Skin is warm and dry. Not diaphoretic.  small area of excoriation top of head-scab fell off today-no surrounding swelling or erythema, no discharge  Psychiatric: Normal mood and affect. Behavior is normal.         Assessment & Plan:   See Problem List for Assessment and Plan of chronic medical problems.

## 2017-07-20 NOTE — Assessment & Plan Note (Signed)
Blood pressure not controlled here today Advised them to monitor closely home and call if it is over 140/90 Currently not on any medication Continue low-sodium diet

## 2017-07-20 NOTE — Assessment & Plan Note (Signed)
Chronic, but getting worse We'll refer to hand surgery for further evaluation and treatment

## 2017-08-01 DIAGNOSIS — M720 Palmar fascial fibromatosis [Dupuytren]: Secondary | ICD-10-CM | POA: Diagnosis not present

## 2017-08-01 DIAGNOSIS — S70262S Insect bite (nonvenomous), left hip, sequela: Secondary | ICD-10-CM | POA: Diagnosis not present

## 2017-08-01 DIAGNOSIS — D485 Neoplasm of uncertain behavior of skin: Secondary | ICD-10-CM | POA: Diagnosis not present

## 2017-08-01 DIAGNOSIS — L820 Inflamed seborrheic keratosis: Secondary | ICD-10-CM | POA: Diagnosis not present

## 2017-08-01 DIAGNOSIS — M1812 Unilateral primary osteoarthritis of first carpometacarpal joint, left hand: Secondary | ICD-10-CM | POA: Diagnosis not present

## 2017-08-22 ENCOUNTER — Other Ambulatory Visit: Payer: Self-pay | Admitting: Orthopedic Surgery

## 2017-08-22 ENCOUNTER — Telehealth: Payer: Self-pay | Admitting: Cardiovascular Disease

## 2017-08-22 DIAGNOSIS — M1812 Unilateral primary osteoarthritis of first carpometacarpal joint, left hand: Secondary | ICD-10-CM | POA: Diagnosis not present

## 2017-08-22 DIAGNOSIS — M720 Palmar fascial fibromatosis [Dupuytren]: Secondary | ICD-10-CM | POA: Diagnosis not present

## 2017-08-22 NOTE — Telephone Encounter (Signed)
° °  [facility] Pre-operative Risk Assessment    Request for surgical clearance:  1. What type of surgery is being performed? Segmented fasciectomy left small finger  2. When is this surgery scheduled? 09/17/2017  3. Are there any medications that need to be held prior to surgery and how long? Please advise if there is any medications that need to be held and how long.   4. Practice name and name of physician performing surgery? [facility], Dr. Daryll Brod   5. What is your office phone and fax number? PH:P [phone], FAX: [phone]  ATTN: Cyndee Brightly, RN   6. Anesthesia type (None, local, MAC, general) ? IV Regional upper Arm Block    Derl Barrow 08/22/2017, 4:30 PM  _________________________________________________________________   (provider comments below)

## 2017-08-24 NOTE — Telephone Encounter (Signed)
    Chart reviewed as part of pre-operative protocol coverage. Because of Fernando Walker's past medical history and time since last visit, he/she will require a follow-up visit in order to better assess preoperative cardiovascular risk.   Pt was last seen by Dr. Angelena Form on 11/12/16. His surgery is scheduled for 09/17/17.   Pre-op covering staff: - Please schedule appointment and call patient to inform them. - Please contact requesting surgeon's office via preferred method (i.e, phone, fax) to inform them of need for appointment prior to surgery.  Daune Perch, NP  08/24/2017, 3:33 PM

## 2017-08-25 ENCOUNTER — Telehealth: Payer: Self-pay | Admitting: Cardiovascular Disease

## 2017-08-25 NOTE — Telephone Encounter (Signed)
Returned pts call and scheduled him a o/v.

## 2017-08-25 NOTE — Telephone Encounter (Signed)
F/u message  Pt returning RN call .please call back

## 2017-08-25 NOTE — Telephone Encounter (Signed)
Called pt to let him know that he would need an apt before he could be cleared for his upcoming sx. Left a message with pts wife to have him call the office since I couldn't find a DPR on file.

## 2017-08-25 NOTE — Telephone Encounter (Signed)
Pt returned my call and he has been made aware that he would need an appt in order to be cleared for sx. Pt has been scheduled with Dr. Angelena Form 08/29/17. Pt thanked me for the call.

## 2017-08-29 ENCOUNTER — Ambulatory Visit (INDEPENDENT_AMBULATORY_CARE_PROVIDER_SITE_OTHER): Payer: Medicare Other | Admitting: Cardiology

## 2017-08-29 ENCOUNTER — Encounter: Payer: Self-pay | Admitting: Cardiology

## 2017-08-29 VITALS — BP 116/82 | HR 66 | Resp 16 | Ht 75.0 in | Wt 213.2 lb

## 2017-08-29 DIAGNOSIS — Z01818 Encounter for other preprocedural examination: Secondary | ICD-10-CM

## 2017-08-29 DIAGNOSIS — I6523 Occlusion and stenosis of bilateral carotid arteries: Secondary | ICD-10-CM

## 2017-08-29 NOTE — Patient Instructions (Addendum)
Medication Instructions:   Your physician recommends that you continue on your current medications as directed. Please refer to the Current Medication list given to you today.   If you need a refill on your cardiac medications before your next appointment, please call your pharmacy.  Labwork: NONE ORDERED  TODAY    Testing/Procedures: NONE ORDERED  TODAY    Follow-Up:   KEEP APPOINTMENT AS SCHEDULED    Any Other Special Instructions Will Be Listed Below (If Applicable).                                                                                                                                                   

## 2017-08-29 NOTE — Progress Notes (Signed)
08/29/2017 [name]   [birth year]  [id number]  Primary Physician Quay Burow, Claudina Lick, MD Primary Cardiologist: Dr. Angelena Form   Reason for Visit/CC: Preoperative Evaluation    HPI:  Fernando Walker is a 83 y.o. male who is being seen today for preoperative evaluation prior to undergoing hand surgery. His last OV has been > 6  Months. He is followed by Dr. Angelena Form. He has a h/o CAD, HTN, and HLD. He has been followed in the past by Dr. Verl Blalock. He underwent 7V CABG in 1995. Last cath 2010 with 7 patents grafts, total proximal occlusion of all native vessels. Carotid artery dopplers October 2015 with mild bilateral disease, 1-39% bilateral ICA. Stress myoview 06/27/13 in setting of dyspnea with inferior wall scar with peri-infarct ischemia, intermediate risk study. After stress test, he was evaluated by Dr. Angelena Form and he stated that he did not have chest pain. His dyspnea was stable. He did not wish to pursue a cardiac cath. Echo 10/30/14 with normal LV function, mild AS.   He now presents to clinic for preoperative evaluation. He is scheduled to undergo segmental fasciotomy of the left small finger by Dr. Fredna Dow on 12/11/8 for Dupuytren's contracture.  He denies any recent cardiac symptoms. No anginal symptomatolgy. He is able to perform > 4 METS of physical activity, using the Duke Activity Status Index, w/o exertional CP or dyspnea. He also denies syncope/ near syncope, orthopnea, PND, LEE and palpitations.    According to the Revised Cardiac Risk Index (RCRI), his Perioperative Risk of Major Cardiac Event is (%): 0.9  His Functional Capacity in METs is: 5.07 according to the Duke Activity Status Index (DASI).   Current Meds  Medication Sig  . aspirin 81 MG tablet Take 81 mg by mouth daily.    . bimatoprost (LUMIGAN) 0.03 % ophthalmic solution Place 1 drop into both eyes at bedtime.   . Cyanocobalamin (VITAMIN B-12 PO) Take 500 mg by mouth daily.  Marland Kitchen triamcinolone cream (KENALOG) 0.1 %  Apply 1 application 2 (two) times daily topically.   Allergies  Allergen Reactions  . Sulfonamide Derivatives     rash   Past Medical History:  Diagnosis Date  . CAD (coronary artery disease) of artery bypass graft   . Carotid arterial disease (HCC)    Nonobstructive  . Cataract   . Dyslipidemia   . GERD (gastroesophageal reflux disease)   . 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 Hyperlipidemia   . Hypertension   . Hypertrophy of prostate with urinary obstruction and other lower urinary tract symptoms (LUTS)   . Pneumonia    As a child  . Pneumonia    as child   Family History  Problem Relation Age of Onset  . Stroke Father 56  . Diabetes Neg Hx   . Cancer Neg Hx   . GER disease Neg Hx   . Heart attack Neg Hx   . Colon cancer Neg Hx   . Esophageal cancer Neg Hx   . Stomach cancer Neg Hx   . Heart disease Neg Hx    Past Surgical History:  Procedure Laterality Date  . cataracts     both eyes  . COLONOSCOPY    . colonoscopy with polypectomy  2004  . CORONARY ARTERY BYPASS GRAFT  1995   X 7  . POLYPECTOMY     Social History   Socioeconomic History  . Marital status: Married    Spouse name: Not on file  . Number of children: Not  on file  . Years of education: Not on file  . Highest education level: Not on file  Social Needs  . Financial resource strain: Not on file  . Food insecurity - worry: Not on file  . Food insecurity - inability: Not on file  . Transportation needs - medical: Not on file  . Transportation needs - non-medical: Not on file  Occupational History  . Not on file  Tobacco Use  . Smoking status: Former Smoker    Last attempt to quit: 10/18/1984    Years since quitting: 32.8  . Smokeless tobacco: Former Network engineer and Sexual Activity  . Alcohol use: Yes    Alcohol/week: 4.2 oz    Types: 7 Glasses of wine per week  . Drug use: No  . Sexual activity: Not on file  Other Topics Concern  . Not on file  Social History Narrative  . Not on file      Review of Systems: General: negative for chills, fever, night sweats or weight changes.  Cardiovascular: negative for chest pain, dyspnea on exertion, edema, orthopnea, palpitations, paroxysmal nocturnal dyspnea or shortness of breath Dermatological: negative for rash Respiratory: negative for cough or wheezing Urologic: negative for hematuria Abdominal: negative for nausea, vomiting, diarrhea, bright red blood per rectum, melena, or hematemesis Neurologic: negative for visual changes, syncope, or dizziness All other systems reviewed and are otherwise negative except as noted above.   Physical Exam:  Blood pressure 116/82, pulse 66, resp. rate 16, height 6\' 3"  (1.905 m), weight 213 lb 3.2 oz (96.7 kg), SpO2 98 %.  General appearance: alert, cooperative and no distress Neck: faint bilateral carotid bruits.  Lungs: clear to auscultation bilaterally Heart: regular rate and rhythm, 1/6 SM Extremities: extremities normal, atraumatic, no cyanosis or edema Pulses: 2+ and symmetric Skin: Skin color, texture, turgor normal. No rashes or lesions Neurologic: Grossly normal  EKG NSR. No ischemia-- personally reviewed   ASSESSMENT AND PLAN:   1. CAD: h/o CABG x 7 in 1995. Patent 7/7 grafts in 2010. Stable w/o anginal symptoms. He is able to perform > 4 mets w/o exertional symptoms. EKG is nonischemic. Continue Medical therapy.   2. HTN: controlled on current regimen.   3. HLD: was previously on simvastatin but discontinued at last OV as LDL was at goal at 70 mg/dL. He is due back for FLP in Jan with plans to restart if not properly diet controlled.   4. AS: mild by last echo in 2016: due for repeat 2D echo in Jan. He denies any CP, dyspnea, syncope/ near syncope. 1/6 SM noted on exam.   5. Carotid Artery Disease: bilateral carotid dopplers 08/2016 showed stable 1-39% ICA disease. Stable w/o symptoms. He is on daily ASA.   6. Pre-operative Risk Assessment: According to the Revised  Cardiac Risk Index (RCRI), his Perioperative Risk of Major Cardiac Event is (%): 0.9  His Functional Capacity in METs is: 5.07 according to the Duke Activity Status Index (DASI).  No exertional symptoms w/ > 4 METs of activity. His risk for major cardiac events <1%. Base on assessment, there is no indication for additional cardiac testing at this time. He can be cleared for hand surgery. He is of acceptable risk.    Follow-Up Keep scheduled f/u with Dr. Angelena Form in early 2019.   Lynita Groseclose Ladoris Gene, MHS [facility] 08/29/2017 3:51 PM

## 2017-09-22 ENCOUNTER — Encounter (HOSPITAL_BASED_OUTPATIENT_CLINIC_OR_DEPARTMENT_OTHER): Payer: Self-pay | Admitting: *Deleted

## 2017-09-27 ENCOUNTER — Encounter (HOSPITAL_BASED_OUTPATIENT_CLINIC_OR_DEPARTMENT_OTHER): Payer: Self-pay | Admitting: Certified Registered"

## 2017-09-27 MED ORDER — KETOROLAC TROMETHAMINE 30 MG/ML IJ SOLN
INTRAMUSCULAR | Status: AC
  Filled 2017-09-27: qty 1

## 2017-09-27 MED ORDER — PROPOFOL 10 MG/ML IV BOLUS
INTRAVENOUS | Status: AC
  Filled 2017-09-27: qty 40

## 2017-09-27 MED ORDER — ONDANSETRON HCL 4 MG/2ML IJ SOLN
INTRAMUSCULAR | Status: AC
  Filled 2017-09-27: qty 2

## 2017-10-12 DIAGNOSIS — I779 Disorder of arteries and arterioles, unspecified: Secondary | ICD-10-CM | POA: Insufficient documentation

## 2017-10-12 DIAGNOSIS — K219 Gastro-esophageal reflux disease without esophagitis: Secondary | ICD-10-CM | POA: Insufficient documentation

## 2017-10-12 DIAGNOSIS — I739 Peripheral vascular disease, unspecified: Secondary | ICD-10-CM

## 2017-10-12 DIAGNOSIS — I25810 Atherosclerosis of coronary artery bypass graft(s) without angina pectoris: Secondary | ICD-10-CM | POA: Insufficient documentation

## 2017-10-12 DIAGNOSIS — I10 Essential (primary) hypertension: Secondary | ICD-10-CM | POA: Insufficient documentation

## 2017-10-12 DIAGNOSIS — J189 Pneumonia, unspecified organism: Secondary | ICD-10-CM | POA: Insufficient documentation

## 2017-10-12 DIAGNOSIS — E785 Hyperlipidemia, unspecified: Secondary | ICD-10-CM | POA: Insufficient documentation

## 2017-10-25 ENCOUNTER — Other Ambulatory Visit: Payer: Self-pay

## 2017-10-25 ENCOUNTER — Ambulatory Visit (HOSPITAL_COMMUNITY): Payer: Medicare Other | Attending: Cardiology

## 2017-10-25 ENCOUNTER — Other Ambulatory Visit: Payer: Medicare Other

## 2017-10-25 DIAGNOSIS — I350 Nonrheumatic aortic (valve) stenosis: Secondary | ICD-10-CM | POA: Insufficient documentation

## 2017-10-25 DIAGNOSIS — E7849 Other hyperlipidemia: Secondary | ICD-10-CM

## 2017-10-25 LAB — COMPREHENSIVE METABOLIC PANEL
A/G RATIO: 1.5 (ref 1.2–2.2)
ALT: 19 IU/L (ref 0–44)
AST: 19 IU/L (ref 0–40)
Albumin: 4.1 g/dL (ref 3.5–4.7)
Alkaline Phosphatase: 60 IU/L (ref 39–117)
BUN/Creatinine Ratio: 12 (ref 10–24)
BUN: 14 mg/dL (ref 8–27)
Bilirubin Total: 0.7 mg/dL (ref 0.0–1.2)
CALCIUM: 9 mg/dL (ref 8.6–10.2)
CO2: 26 mmol/L (ref 20–29)
Chloride: 101 mmol/L (ref 96–106)
Creatinine, Ser: 1.16 mg/dL (ref 0.76–1.27)
GFR, EST AFRICAN AMERICAN: 67 mL/min/{1.73_m2} (ref >59)
GFR, EST NON AFRICAN AMERICAN: 58 mL/min/{1.73_m2} — AB (ref >59)
Globulin, Total: 2.8 g/dL (ref 1.5–4.5)
Glucose: 115 mg/dL — ABNORMAL HIGH (ref 65–99)
POTASSIUM: 4.5 mmol/L (ref 3.5–5.2)
Sodium: 141 mmol/L (ref 134–144)
TOTAL PROTEIN: 6.9 g/dL (ref 6.0–8.5)

## 2017-10-25 LAB — LIPID PANEL
CHOL/HDL RATIO: 3 ratio (ref 0.0–5.0)
Cholesterol, Total: 164 mg/dL (ref 100–199)
HDL: 54 mg/dL (ref >39)
LDL CALC: 97 mg/dL (ref 0–99)
Triglycerides: 65 mg/dL (ref 0–149)
VLDL Cholesterol Cal: 13 mg/dL (ref 5–40)

## 2017-10-26 ENCOUNTER — Telehealth: Payer: Self-pay | Admitting: Cardiovascular Disease

## 2017-10-26 DIAGNOSIS — E785 Hyperlipidemia, unspecified: Secondary | ICD-10-CM

## 2017-10-26 MED ORDER — SIMVASTATIN 20 MG PO TABS: 20.0000 mg | ORAL_TABLET | Freq: Every day | ORAL | 3 refills | Status: DC

## 2017-10-26 NOTE — Telephone Encounter (Signed)
°

## 2017-10-26 NOTE — Telephone Encounter (Signed)
I spoke with pt and reviewed lab results and echo result with him

## 2017-10-27 DIAGNOSIS — H401131 Primary open-angle glaucoma, bilateral, mild stage: Secondary | ICD-10-CM | POA: Diagnosis not present

## 2017-11-02 ENCOUNTER — Ambulatory Visit (INDEPENDENT_AMBULATORY_CARE_PROVIDER_SITE_OTHER): Payer: Medicare Other | Admitting: Cardiovascular Disease

## 2017-11-02 ENCOUNTER — Encounter: Payer: Self-pay | Admitting: Cardiovascular Disease

## 2017-11-02 VITALS — BP 122/86 | HR 57 | Ht 75.0 in | Wt 208.0 lb

## 2017-11-02 DIAGNOSIS — I10 Essential (primary) hypertension: Secondary | ICD-10-CM

## 2017-11-02 DIAGNOSIS — E7800 Pure hypercholesterolemia, unspecified: Secondary | ICD-10-CM

## 2017-11-02 DIAGNOSIS — I6523 Occlusion and stenosis of bilateral carotid arteries: Secondary | ICD-10-CM | POA: Diagnosis not present

## 2017-11-02 DIAGNOSIS — I2510 Atherosclerotic heart disease of native coronary artery without angina pectoris: Secondary | ICD-10-CM | POA: Diagnosis not present

## 2017-11-02 DIAGNOSIS — I350 Nonrheumatic aortic (valve) stenosis: Secondary | ICD-10-CM

## 2017-11-02 NOTE — Patient Instructions (Signed)
Medication Instructions:  Your physician recommends that you continue on your current medications as directed. Please refer to the Current Medication list given to you today.   Labwork: Your physician recommends that you return for lab work on April 3,2019.  The lab opens at 7:30 AM   Testing/Procedures: none  Follow-Up: Your physician recommends that you schedule a follow-up appointment in: 12 months. Please call our office in about 9 months to schedule this appointment    Any Other Special Instructions Will Be Listed Below (If Applicable).     If you need a refill on your cardiac medications before your next appointment, please call your pharmacy.

## 2017-11-02 NOTE — Progress Notes (Signed)
Chief Complaint  Patient presents with  . Follow-up    CAD   History of Present Illness: 84 yo male with history of CAD, HTN, HLD here today for cardiac follow up. He underwent 7V CABG in 1995. Last cath 2010 with 7 patents grafts, total proximal occlusion of all native vessels. Carotid artery dopplers October 2015 with mild bilateral disease. Stress myoview 06/27/13 in setting of dyspnea with inferior wall scar with peri-infarct ischemia, intermediate risk study. I saw him in f/u and he stated that he did not have chest pain. His dyspnea was stable. He did not wish to pursue a cardiac cath. Echo 10/26/16 with normal LV function, mild AS (mean gradient 13 mmHg). .   He is here today for follow up. The patient denies any chest pain, dyspnea, palpitations, lower extremity edema, orthopnea, PND, dizziness, near syncope or syncope.   Primary Care Physician: Binnie Rail, MD  Past Medical History:  Diagnosis Date  . CAD (coronary artery disease) of artery bypass graft   . Carotid arterial disease (HCC)    Nonobstructive  . Cataract   . Dyslipidemia   . GERD (gastroesophageal reflux disease)   . 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 Hyperlipidemia   . Hypertension   . Hypertrophy of prostate with urinary obstruction and other lower urinary tract symptoms (LUTS)   . Pneumonia    As a child  . Pneumonia    as child    Past Surgical History:  Procedure Laterality Date  . cataracts     both eyes  . COLONOSCOPY    . colonoscopy with polypectomy  2004  . CORONARY ARTERY BYPASS GRAFT  1995   X 7  . POLYPECTOMY      Current Outpatient Medications  Medication Sig Dispense Refill  . aspirin 81 MG tablet Take 81 mg by mouth daily.      . bimatoprost (LUMIGAN) 0.03 % ophthalmic solution Place 1 drop into both eyes at bedtime.     . Cyanocobalamin (VITAMIN B-12 PO) Take 500 mg by mouth daily.    . simvastatin (ZOCOR) 20 MG tablet Take 1 tablet (20 mg total) by mouth at bedtime. 90 tablet 3  .  triamcinolone cream (KENALOG) 0.1 % Apply 1 application 2 (two) times daily topically.     No current facility-administered medications for this visit.     Allergies  Allergen Reactions  . Sulfonamide Derivatives     rash    Social History   Socioeconomic History  . Marital status: Married    Spouse name: Not on file  . Number of children: Not on file  . Years of education: Not on file  . Highest education level: Not on file  Social Needs  . Financial resource strain: Not on file  . Food insecurity - worry: Not on file  . Food insecurity - inability: Not on file  . Transportation needs - medical: Not on file  . Transportation needs - non-medical: Not on file  Occupational History  . Not on file  Tobacco Use  . Smoking status: Former Smoker    Last attempt to quit: 10/18/1984    Years since quitting: 33.0  . Smokeless tobacco: Former Network engineer and Sexual Activity  . Alcohol use: Yes    Alcohol/week: 4.2 oz    Types: 7 Glasses of wine per week  . Drug use: No  . Sexual activity: Not on file  Other Topics Concern  . Not on file  Social History  Narrative  . Not on file    Family History  Problem Relation Age of Onset  . Stroke Father 108  . Diabetes Neg Hx   . Cancer Neg Hx   . GER disease Neg Hx   . Heart attack Neg Hx   . Colon cancer Neg Hx   . Esophageal cancer Neg Hx   . Stomach cancer Neg Hx   . Heart disease Neg Hx     Review of Systems:  As stated in the HPI and otherwise negative.   BP 122/86   Pulse (!) 57   Ht 6\' 3"  (1.905 m)   Wt 208 lb (94.3 kg)   SpO2 95%   BMI 26.00 kg/m   Physical Examination:  General: Well developed, well nourished, NAD  HEENT: OP clear, mucus membranes moist  SKIN: warm, dry. No rashes. Neuro: No focal deficits  Musculoskeletal: Muscle strength 5/5 all ext  Psychiatric: Mood and affect normal  Neck: No JVD, no carotid bruits, no thyromegaly, no lymphadenopathy.  Lungs:Clear bilaterally, no wheezes, rhonci,  crackles Cardiovascular: Regular rate and rhythm. No murmurs, gallops or rubs. Abdomen:Soft. Bowel sounds present. Non-tender.  Extremities: No lower extremity edema. Pulses are 2 + in the bilateral DP/PT.  Echo 10/25/17: - Left ventricle: The cavity size was normal. Systolic function was   vigorous. The estimated ejection fraction was in the range of 65%   to 70%. Wall motion was normal; there were no regional wall   motion abnormalities. There was an increased relative   contribution of atrial contraction to ventricular filling.   Doppler parameters are consistent with abnormal left ventricular   relaxation (grade 1 diastolic dysfunction). - Aortic valve: Severe diffuse calcification involving the left   coronary and noncoronary cusp. Valve mobility was restricted.   There was mild stenosis. Mean gradient (S): 13 mm Hg. - Mitral valve: Calcified annulus. There was trivial regurgitation. - Atrial septum: There was increased thickness of the septum,   consistent with lipomatous hypertrophy.  Impressions:  - Normal LVH, mild AS and grade 1 DD. Compared to study of 2016   there is no significant change in mean AV gradient (22mmHg in   2016 and 80mmHg this study).  Cardiac cath 05/01/09:  The left main coronary artery was free of significant disease.  The left anterior descending artery was completely occluded after 2  septal perforators in its proximal portion.  The circumflex artery was completely occluded in its midportion. There  is a 90% proximal stenosis. The distal vessel filled faintly via  bridging collaterals.  The right coronary artery was completely occluded near its origin.  The saphenous vein graft to the acute marginal and posterior ascending  branch of the right coronary artery were patent. There was a filling  defect in the mid-to-distal portion of the graft, which did not appear  to be obstructive. There was also a 30-40% narrowing in midportion of  the graft just  after the attachment to the acute marginal branch.  The LIMA graft to LAD was patent and functioned normally.  The sequential graft to the marginal and posterolateral branch of  circumflex artery was patent and functioned normally. There were  minimal luminal irregularities in the graft.  The vein graft to the first and second diagonal branch of the LAD was  patent and functioned normally.  The left ventriculogram performed in the RAO projection showed good wall  motion with no areas of hypokinesis. The estimated fraction was 65%.  HEMODYNAMIC DATA:  The aortic pressure was 100/59 with a mean of 77.  The left ventricular pressure was 100/10.  EKG:  EKG is  not ordered today. The ekg ordered today demonstrates   Recent Labs: 10/25/2017: ALT 19; BUN 14; Creatinine, Ser 1.16; Potassium 4.5; Sodium 141   Lipid Panel    Component Value Date/Time   CHOL 164 10/25/2017 0851   TRIG 65 10/25/2017 0851   HDL 54 10/25/2017 0851   CHOLHDL 3.0 10/25/2017 0851   CHOLHDL 2 08/27/2016 1007   VLDL 11.6 08/27/2016 1007   LDLCALC 97 10/25/2017 0851     Wt Readings from Last 3 Encounters:  11/02/17 208 lb (94.3 kg)  08/29/17 213 lb 3.2 oz (96.7 kg)  07/20/17 207 lb (93.9 kg)     Other studies Reviewed: Additional studies/ records that were reviewed today include: . Review of the above records demonstrates:   Assessment and Plan:   1. CAD s/p CABG without angina: Pt with known CAD with prior CABG in 1995. Last cath 2010 with 7 patent grafts. He seems to be doing well. No chest pain suggestive of angina. Will continue ASA and statin.     2. HTN: BP is controlled. No changes today.   3. Hyperlipidemia: Continue statin. He just restarted his statin last week. Will need repeat lipids and LFTs in 10 weeks.   4. Carotid artery disease: He has mild bilateral carotid artery disease by dopplers in November 2017. NO plans to repeat given age and stability of disease over time.   5. Aortic stenosis:  mild by echo January 2019. Repeat echo January 2021.    Current medicines are reviewed at length with the patient today.  The patient does not have concerns regarding medicines.  The following changes have been made:  no change  Labs/ tests ordered today include:   No orders of the defined types were placed in this encounter.   Disposition:   FU with me in 12  months  Signed, Lauree Chandler, MD 11/02/2017 11:31 AM    [facility] [place], [place], [place]  [address] Phone: [phone]; Fax: [phone]

## 2017-11-10 ENCOUNTER — Encounter (HOSPITAL_BASED_OUTPATIENT_CLINIC_OR_DEPARTMENT_OTHER): Payer: Self-pay | Admitting: *Deleted

## 2017-11-15 ENCOUNTER — Encounter (HOSPITAL_BASED_OUTPATIENT_CLINIC_OR_DEPARTMENT_OTHER)
Admission: RE | Disposition: A | Discharge status: Home / Self Care | Payer: Self-pay | Source: Ambulatory Visit | Attending: Orthopedic Surgery

## 2017-11-15 ENCOUNTER — Other Ambulatory Visit: Payer: Self-pay | Admitting: Orthopedic Surgery

## 2017-11-15 ENCOUNTER — Ambulatory Visit (HOSPITAL_BASED_OUTPATIENT_CLINIC_OR_DEPARTMENT_OTHER)
Admission: RE | Admit: 2017-11-15 | Discharge: 2017-11-15 | Disposition: A | Discharge status: Home / Self Care | Payer: Medicare Other | Source: Ambulatory Visit | Attending: Orthopedic Surgery | Admitting: Orthopedic Surgery

## 2017-11-15 SURGERY — FASCIOTOMY, UPPER EXTREMITY
Anesthesia: Monitor Anesthesia Care | Laterality: Left

## 2017-11-15 MED ORDER — FENTANYL CITRATE (PF) 100 MCG/2ML IJ SOLN: 50.0000 ug | INTRAMUSCULAR | Status: DC | PRN

## 2017-11-15 MED ORDER — LACTATED RINGERS IV SOLN: INTRAVENOUS | Status: DC

## 2017-11-15 MED ORDER — CEFAZOLIN SODIUM-DEXTROSE 2-4 GM/100ML-% IV SOLN: 2.0000 g | INTRAVENOUS | Status: DC

## 2017-11-15 MED ORDER — CHLORHEXIDINE GLUCONATE 4 % EX LIQD: 60.0000 mL | Freq: Once | CUTANEOUS | Status: DC

## 2017-11-15 MED ORDER — SCOPOLAMINE 1 MG/3DAYS TD PT72: 1.0000 | MEDICATED_PATCH | Freq: Once | TRANSDERMAL | Status: DC | PRN

## 2017-11-15 MED ORDER — MIDAZOLAM HCL 2 MG/2ML IJ SOLN: 1.0000 mg | INTRAMUSCULAR | Status: DC | PRN

## 2017-11-15 NOTE — Progress Notes (Signed)
Pt presents with a small cut on 5th finger on left hand. Evaluated by Dr Duanne Guess, and we will need to reschedule surgery.  Pt and family aware and agreeable of plan.

## 2017-11-15 NOTE — Anesthesia Preprocedure Evaluation (Deleted)
Anesthesia Evaluation  Patient identified by MRN, date of birth, ID band Patient awake    Reviewed: Allergy & Precautions, NPO status , Patient's Chart, lab work & pertinent test results  History of Anesthesia Complications Negative for: history of anesthetic complications  Airway        Dental   Pulmonary former smoker,           Cardiovascular hypertension, + CAD and + CABG       Neuro/Psych negative neurological ROS     GI/Hepatic Neg liver ROS, GERD  ,  Endo/Other  negative endocrine ROS  Renal/GU negative Renal ROS     Musculoskeletal negative musculoskeletal ROS (+)   Abdominal   Peds  Hematology negative hematology ROS (+)   Anesthesia Other Findings Day of surgery medications reviewed with the patient.  Reproductive/Obstetrics                            Anesthesia Physical Anesthesia Plan  ASA: III  Anesthesia Plan: MAC and Regional   Post-op Pain Management:    Induction:   PONV Risk Score and Plan: 2 and Ondansetron and Dexamethasone  Airway Management Planned: Natural Airway and Simple Face Mask  Additional Equipment:   Intra-op Plan:   Post-operative Plan:   Informed Consent: I have reviewed the patients History and Physical, chart, labs and discussed the procedure including the risks, benefits and alternatives for the proposed anesthesia with the patient or authorized representative who has indicated his/her understanding and acceptance.     Plan Discussed with:   Anesthesia Plan Comments:        Anesthesia Quick Evaluation

## 2017-11-22 ENCOUNTER — Encounter (HOSPITAL_BASED_OUTPATIENT_CLINIC_OR_DEPARTMENT_OTHER): Payer: Self-pay | Admitting: *Deleted

## 2017-11-23 ENCOUNTER — Other Ambulatory Visit (HOSPITAL_COMMUNITY): Payer: Medicare Other

## 2017-11-29 ENCOUNTER — Ambulatory Visit (HOSPITAL_BASED_OUTPATIENT_CLINIC_OR_DEPARTMENT_OTHER)
Admission: RE | Admit: 2017-11-29 | Discharge: 2017-11-29 | Disposition: A | Discharge status: Home / Self Care | Payer: Medicare Other | Source: Ambulatory Visit | Attending: Orthopedic Surgery | Admitting: Orthopedic Surgery

## 2017-11-29 ENCOUNTER — Encounter (HOSPITAL_BASED_OUTPATIENT_CLINIC_OR_DEPARTMENT_OTHER)
Admission: RE | Disposition: A | Discharge status: Home / Self Care | Payer: Self-pay | Source: Ambulatory Visit | Attending: Orthopedic Surgery

## 2017-11-29 ENCOUNTER — Ambulatory Visit (HOSPITAL_BASED_OUTPATIENT_CLINIC_OR_DEPARTMENT_OTHER): Payer: Medicare Other | Admitting: Certified Registered"

## 2017-11-29 ENCOUNTER — Other Ambulatory Visit: Payer: Self-pay

## 2017-11-29 ENCOUNTER — Encounter (HOSPITAL_BASED_OUTPATIENT_CLINIC_OR_DEPARTMENT_OTHER): Payer: Self-pay

## 2017-11-29 DIAGNOSIS — I739 Peripheral vascular disease, unspecified: Secondary | ICD-10-CM | POA: Insufficient documentation

## 2017-11-29 DIAGNOSIS — Z823 Family history of stroke: Secondary | ICD-10-CM | POA: Insufficient documentation

## 2017-11-29 DIAGNOSIS — I25810 Atherosclerosis of coronary artery bypass graft(s) without angina pectoris: Secondary | ICD-10-CM | POA: Insufficient documentation

## 2017-11-29 DIAGNOSIS — K219 Gastro-esophageal reflux disease without esophagitis: Secondary | ICD-10-CM | POA: Diagnosis not present

## 2017-11-29 DIAGNOSIS — Z951 Presence of aortocoronary bypass graft: Secondary | ICD-10-CM | POA: Insufficient documentation

## 2017-11-29 DIAGNOSIS — Z9842 Cataract extraction status, left eye: Secondary | ICD-10-CM | POA: Insufficient documentation

## 2017-11-29 DIAGNOSIS — I10 Essential (primary) hypertension: Secondary | ICD-10-CM | POA: Diagnosis not present

## 2017-11-29 DIAGNOSIS — M1812 Unilateral primary osteoarthritis of first carpometacarpal joint, left hand: Secondary | ICD-10-CM | POA: Diagnosis not present

## 2017-11-29 DIAGNOSIS — N401 Enlarged prostate with lower urinary tract symptoms: Secondary | ICD-10-CM | POA: Diagnosis not present

## 2017-11-29 DIAGNOSIS — I7789 Other specified disorders of arteries and arterioles: Secondary | ICD-10-CM | POA: Insufficient documentation

## 2017-11-29 DIAGNOSIS — Z9841 Cataract extraction status, right eye: Secondary | ICD-10-CM | POA: Insufficient documentation

## 2017-11-29 DIAGNOSIS — Z882 Allergy status to sulfonamides status: Secondary | ICD-10-CM | POA: Insufficient documentation

## 2017-11-29 DIAGNOSIS — E785 Hyperlipidemia, unspecified: Secondary | ICD-10-CM | POA: Diagnosis not present

## 2017-11-29 DIAGNOSIS — L111 Transient acantholytic dermatosis [Grover]: Secondary | ICD-10-CM | POA: Insufficient documentation

## 2017-11-29 DIAGNOSIS — M720 Palmar fascial fibromatosis [Dupuytren]: Secondary | ICD-10-CM | POA: Insufficient documentation

## 2017-11-29 DIAGNOSIS — Z87891 Personal history of nicotine dependence: Secondary | ICD-10-CM | POA: Insufficient documentation

## 2017-11-29 HISTORY — PX: FASCIECTOMY: SHX6525

## 2017-11-29 SURGERY — FASCIECTOMY, PALM
Anesthesia: Monitor Anesthesia Care | Site: Hand | Laterality: Left

## 2017-11-29 MED ORDER — FENTANYL CITRATE (PF) 100 MCG/2ML IJ SOLN: 25.0000 ug | INTRAMUSCULAR | Status: DC | PRN

## 2017-11-29 MED ORDER — ONDANSETRON HCL 4 MG/2ML IJ SOLN
INTRAMUSCULAR | Status: DC | PRN
  Administered 2017-11-29: 4 mg via INTRAVENOUS

## 2017-11-29 MED ORDER — PROPOFOL 500 MG/50ML IV EMUL
INTRAVENOUS | Status: DC | PRN
  Administered 2017-11-29: 75 ug/kg/min via INTRAVENOUS

## 2017-11-29 MED ORDER — MIDAZOLAM HCL 2 MG/2ML IJ SOLN
INTRAMUSCULAR | Status: AC
  Filled 2017-11-29: qty 2

## 2017-11-29 MED ORDER — CHLORHEXIDINE GLUCONATE 4 % EX LIQD: 60.0000 mL | Freq: Once | CUTANEOUS | Status: DC

## 2017-11-29 MED ORDER — CEFAZOLIN SODIUM-DEXTROSE 2-4 GM/100ML-% IV SOLN
INTRAVENOUS | Status: AC
  Filled 2017-11-29: qty 100

## 2017-11-29 MED ORDER — LACTATED RINGERS IV SOLN
INTRAVENOUS | Status: DC
  Administered 2017-11-29: 10:00:00 via INTRAVENOUS

## 2017-11-29 MED ORDER — FENTANYL CITRATE (PF) 100 MCG/2ML IJ SOLN
50.0000 ug | INTRAMUSCULAR | Status: DC | PRN
  Administered 2017-11-29: 100 ug via INTRAVENOUS

## 2017-11-29 MED ORDER — BUPIVACAINE-EPINEPHRINE (PF) 0.5% -1:200000 IJ SOLN
INTRAMUSCULAR | Status: DC | PRN
  Administered 2017-11-29: 27 mL via PERINEURAL

## 2017-11-29 MED ORDER — MIDAZOLAM HCL 2 MG/2ML IJ SOLN: 1.0000 mg | INTRAMUSCULAR | Status: DC | PRN

## 2017-11-29 MED ORDER — CEFAZOLIN SODIUM-DEXTROSE 2-4 GM/100ML-% IV SOLN
2.0000 g | INTRAVENOUS | Status: AC
  Administered 2017-11-29: 2 g via INTRAVENOUS

## 2017-11-29 MED ORDER — SCOPOLAMINE 1 MG/3DAYS TD PT72: 1.0000 | MEDICATED_PATCH | Freq: Once | TRANSDERMAL | Status: DC | PRN

## 2017-11-29 MED ORDER — THROMBIN 5000 UNITS EX SOLR
CUTANEOUS | Status: AC
  Filled 2017-11-29: qty 5000

## 2017-11-29 MED ORDER — LIDOCAINE HCL (CARDIAC) 20 MG/ML IV SOLN
INTRAVENOUS | Status: DC | PRN
  Administered 2017-11-29: 30 mg via INTRAVENOUS

## 2017-11-29 MED ORDER — HYDROCODONE-ACETAMINOPHEN 5-325 MG PO TABS: 1.0000 | ORAL_TABLET | Freq: Four times a day (QID) | ORAL | 0 refills | Status: DC | PRN

## 2017-11-29 MED ORDER — FENTANYL CITRATE (PF) 100 MCG/2ML IJ SOLN
INTRAMUSCULAR | Status: AC
Start: 2017-11-29 — End: ?
  Filled 2017-11-29: qty 2

## 2017-11-29 SURGICAL SUPPLY — 44 items
BLADE MINI RND TIP GREEN BEAV (BLADE) ×3 IMPLANT
BLADE SURG 15 STRL LF DISP TIS (BLADE) ×1 IMPLANT
BLADE SURG 15 STRL SS (BLADE) ×2
BNDG COHESIVE 3X5 TAN STRL LF (GAUZE/BANDAGES/DRESSINGS) ×3 IMPLANT
BNDG ESMARK 4X9 LF (GAUZE/BANDAGES/DRESSINGS) ×3 IMPLANT
BNDG GAUZE ELAST 4 BULKY (GAUZE/BANDAGES/DRESSINGS) ×3 IMPLANT
CHLORAPREP W/TINT 26ML (MISCELLANEOUS) ×3 IMPLANT
CORD BIPOLAR FORCEPS 12FT (ELECTRODE) ×3 IMPLANT
COVER BACK TABLE 60X90IN (DRAPES) ×3 IMPLANT
COVER MAYO STAND STRL (DRAPES) ×3 IMPLANT
CUFF TOURNIQUET SINGLE 18IN (TOURNIQUET CUFF) ×3 IMPLANT
DECANTER SPIKE VIAL GLASS SM (MISCELLANEOUS) IMPLANT
DRAPE EXTREMITY T 121X128X90 (DRAPE) ×3 IMPLANT
DRAPE SURG 17X23 STRL (DRAPES) ×3 IMPLANT
GAUZE SPONGE 4X4 12PLY STRL (GAUZE/BANDAGES/DRESSINGS) ×3 IMPLANT
GAUZE XEROFORM 1X8 LF (GAUZE/BANDAGES/DRESSINGS) ×3 IMPLANT
GLOVE BIOGEL M STRL SZ7.5 (GLOVE) ×3 IMPLANT
GLOVE BIOGEL PI IND STRL 8 (GLOVE) ×1 IMPLANT
GLOVE BIOGEL PI IND STRL 8.5 (GLOVE) ×1 IMPLANT
GLOVE BIOGEL PI INDICATOR 8 (GLOVE) ×2
GLOVE BIOGEL PI INDICATOR 8.5 (GLOVE) ×2
GLOVE EXAM NITRILE MD LF STRL (GLOVE) ×3 IMPLANT
GLOVE SURG ORTHO 8.0 STRL STRW (GLOVE) ×3 IMPLANT
GOWN STRL REUS W/ TWL LRG LVL3 (GOWN DISPOSABLE) IMPLANT
GOWN STRL REUS W/ TWL XL LVL3 (GOWN DISPOSABLE) ×1 IMPLANT
GOWN STRL REUS W/TWL LRG LVL3 (GOWN DISPOSABLE)
GOWN STRL REUS W/TWL XL LVL3 (GOWN DISPOSABLE) ×5 IMPLANT
LOOP VESSEL MAXI BLUE (MISCELLANEOUS) ×3 IMPLANT
NEEDLE PRECISIONGLIDE 27X1.5 (NEEDLE) ×3 IMPLANT
NS IRRIG 1000ML POUR BTL (IV SOLUTION) ×3 IMPLANT
PACK BASIN DAY SURGERY FS (CUSTOM PROCEDURE TRAY) ×3 IMPLANT
PAD CAST 3X4 CTTN HI CHSV (CAST SUPPLIES) ×1 IMPLANT
PADDING CAST COTTON 3X4 STRL (CAST SUPPLIES) ×2
SLEEVE SCD COMPRESS KNEE MED (MISCELLANEOUS) IMPLANT
SLING ARM FOAM STRAP LRG (SOFTGOODS) ×3 IMPLANT
SPLINT PLASTER CAST XFAST 3X15 (CAST SUPPLIES) IMPLANT
SPLINT PLASTER XTRA FASTSET 3X (CAST SUPPLIES)
STOCKINETTE 4X48 STRL (DRAPES) ×3 IMPLANT
SUT ETHILON 4 0 PS 2 18 (SUTURE) ×3 IMPLANT
SUT SILK 2 0 PERMA HAND 18 BK (SUTURE) IMPLANT
SYR BULB 3OZ (MISCELLANEOUS) ×3 IMPLANT
SYR CONTROL 10ML LL (SYRINGE) ×3 IMPLANT
TOWEL OR 17X24 6PK STRL BLUE (TOWEL DISPOSABLE) ×6 IMPLANT
UNDERPAD 30X30 (UNDERPADS AND DIAPERS) ×3 IMPLANT

## 2017-11-29 NOTE — H&P (Signed)
Fernando Walker is an 84 y.o. male.   Chief Complaint: dupuytren's contracture left small finger HPI: Fernando Walker is a 84 year old right-hand-dominant male comes in referred by Dr. Quay Burow for consultation regarding a contracture of his left small finger. This been present for 2 years increasing over the past 6 months. He recalls no history of injury. It is not causing him any pain unless he forces his finger straight. He is of [place] descent. He does have siblings and parents with out similar problems. He does not have lumps on his feet or penis. He has no history of diabetes thyroid problems arthritis gout. Family history is negative for each of these also. He has not complained of any pain or discomfort.      Past Medical History:  Diagnosis Date  . CAD (coronary artery disease) of artery bypass graft   . Carotid arterial disease (HCC)    Nonobstructive  . Cataract   . Dyslipidemia   . GERD (gastroesophageal reflux disease)   . 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 Hyperlipidemia   . Hypertension   . Hypertrophy of prostate with urinary obstruction and other lower urinary tract symptoms (LUTS)   . Pneumonia    As a child  . Pneumonia    as child    Past Surgical History:  Procedure Laterality Date  . cataracts     both eyes  . COLONOSCOPY    . colonoscopy with polypectomy  2004  . CORONARY ARTERY BYPASS GRAFT  1995   X 7  . POLYPECTOMY      Family History  Problem Relation Age of Onset  . Stroke Father 64  . Diabetes Neg Hx   . Cancer Neg Hx   . GER disease Neg Hx   . Heart attack Neg Hx   . Colon cancer Neg Hx   . Esophageal cancer Neg Hx   . Stomach cancer Neg Hx   . Heart disease Neg Hx    Social History:  reports that he quit smoking about 33 years ago. He has quit using smokeless tobacco. He reports that he drinks about 4.2 oz of alcohol per week. He reports that he does not use drugs.  Allergies:  Allergies  Allergen Reactions  . Sulfonamide Derivatives    rash    No medications prior to admission.    No results found for this or any previous visit (from the past 48 hour(s)).  No results found.   Pertinent items are noted in HPI.  There were no vitals taken for this visit.  General appearance: alert, cooperative and appears stated age Head: Normocephalic, without obvious abnormality Neck: no JVD Resp: clear to auscultation bilaterally Cardio: regular rate and rhythm, S1, S2 normal, no murmur, click, rub or gallop GI: soft, non-tender; bowel sounds normal; no masses,  no organomegaly Extremities: flexion contracture left small finger Pulses: 2+ and symmetric Skin: Skin color, texture, turgor normal. No rashes or lesions Neurologic: Grossly normal Incision/Wound: na  Assessment/Plan Assessment:  1. Dupuytren's contracture of left hand  2. Primary osteoarthritis of first carpometacarpal joint of left hand    Plan: We had a thorough discussion with him and his wife on his last visit. He would like to have segmental fasciectomy performed. This will continue to his left small finger. Pre-peri-and postoperative course are discussed along with risks and complications. He is aware that there is no guarantee to the surgery the possibility of infection recurrence injury to arteries nerves tendons incomplete relief symptoms dystrophy ability finger  loss. Questions are encouraged and answered to his satisfaction. He is scheduled for segmental fasciectomy of left small finger as an outpatient under regional anesthesia.      Kayelyn Lemon R 11/29/2017, 8:26 AM

## 2017-11-29 NOTE — Anesthesia Procedure Notes (Signed)
Procedure Name: MAC Date/Time: 11/29/2017 11:13 AM Performed by: Signe Colt, CRNA Pre-anesthesia Checklist: Patient identified, Emergency Drugs available, Suction available, Patient being monitored and Timeout performed Patient Re-evaluated:Patient Re-evaluated prior to induction Oxygen Delivery Method: Simple face mask

## 2017-11-29 NOTE — Discharge Instructions (Addendum)

## 2017-11-29 NOTE — Transfer of Care (Signed)
Immediate Anesthesia Transfer of Care Note  Patient: Fernando Walker  Procedure(s) Performed: SEGMENTAL FASCIECTOMY LEFT SMALL FINGER (Left Hand)  Patient Location: PACU  Anesthesia Type:MAC combined with regional for post-op pain  Level of Consciousness: awake, alert , oriented and patient cooperative  Airway & Oxygen Therapy: Patient Spontanous Breathing and Patient connected to face mask oxygen  Post-op Assessment: Report given to RN and Post -op Vital signs reviewed and stable  Post vital signs: Reviewed and stable  Last Vitals:  Vitals:   11/29/17 1045 11/29/17 1050  BP: 134/67   Pulse: (!) 59 70  Resp: 11 (!) 22  Temp:    SpO2: 100% 99%    Last Pain:  Vitals:   11/29/17 1008  TempSrc: Oral         Complications: No apparent anesthesia complications

## 2017-11-29 NOTE — Op Note (Signed)
NAME:  Fernando Walker, Fernando Walker NO.:  [id number]  MEDICAL RECORD NO.:  [id number]  LOCATION:                                 FACILITY:  PHYSICIAN:  Daryll Brod, M.D.            DATE OF BIRTH:  DATE OF PROCEDURE:  11/29/2017 DATE OF DISCHARGE:                              OPERATIVE REPORT   PREOPERATIVE DIAGNOSIS:  Dupuytren's contracture, left small finger.  POSTOPERATIVE DIAGNOSIS:  Dupuytren's contracture, left small finger.  OPERATION:  Segmental fasciectomy, left small finger.  SURGEON:  Daryll Brod, MD.  ASSISTANT:  None.  ANESTHESIA:  Axillary block with IV sedation.  PLACE OF SURGERY:  [facility].  ANESTHESIOLOGIST:  Soledad Gerlach, MD.  HISTORY:  The patient is an 84 year old male with a history of Dupuytren's contracture.  He has elected to undergo segmental fasciectomy.  Pre, peri, and postoperative course have been discussed along with risks and complications.  He is aware that there is no guarantee to the surgery; the possibility of infection; recurrence of injury to arteries, nerves, tendons; incomplete relief of symptoms; and dystrophy.  In the preoperative area, the patient is seen, the extremity marked by both patient and surgeon.  Antibiotic given.  DESCRIPTION OF PROCEDURE:  The patient was brought to the operating room after a regional anesthetic was carried out without difficulty under the direction of Dr. Ola Spurr.  He was prepped with ChloraPrep in a supine position with left arm free.  IV sedation was given.  After adequate anesthesia was afforded, the limb was exsanguinated with an Esmarch bandage.  Tourniquet placed high on the arm was inflated to 250 mmHg.  A transverse incision was then made at the junction of the palmar fascia to the flexor retinaculum proximally at the level of the left small finger, ring finger.  This was carried down through subcutaneous tissue. Dense cord was immediately apparent in the palmar  fascia.  This was isolated protecting neurovascular bundles, which were deep.  This was then transected and a segment resected approximately a centimeter in length.  The fascia to the ring finger was also transected.  A separate incision was then made at the level of the metacarpophalangeal joint crease, carried down through subcutaneous tissue.  A dense cord was again immediately apparent.  Neurovascular bundles were identified radially and ulnarly.  Protection was placed with a Soil scientist.  The segment of cord was removed again approximately a centimeter in length. A third incision was then made over the proximal phalanx carried down through subcutaneous tissue.  The cord was densely adherent to the skin. This was dissected free with sharp dissection.  Deep structures were isolated and identified, the neurovascular bundles radially and ulnarly were identified.  A Freer elevator placed beneath the cord and again a segment approximately a centimeter in length was resected.  Each of these was sent to Pathology.  The finger was fully straight at the MP and PIP joints.  There was no full flexion deformity to the DIP secondary to cord formation.  The wound was copiously irrigated with saline.  The proximal and distal wounds were able to be closed with horizontal mattress 4-0  nylon sutures.  This left a wide opening on the middle wound.  This was then sterilely dressed.  The tourniquet deflated.  All fingers immediately pinked.  A compressive component was placed and he was taken to the recovery room for observation in satisfactory condition.  No splint was placed.  He will be discharged to home to return to the [place] in 1 week, on [place].          ______________________________ Daryll Brod, M.D.     GK/MEDQ  D:  11/29/2017  T:  11/29/2017  Job:  [id number]

## 2017-11-29 NOTE — Anesthesia Postprocedure Evaluation (Signed)
Anesthesia Post Note  Patient: Fernando Walker  Procedure(s) Performed: SEGMENTAL FASCIECTOMY LEFT SMALL FINGER (Left Hand)     Patient location during evaluation: PACU Anesthesia Type: Regional Level of consciousness: awake and alert Pain management: pain level controlled Vital Signs Assessment: post-procedure vital signs reviewed and stable Respiratory status: spontaneous breathing, nonlabored ventilation, respiratory function stable and patient connected to nasal cannula oxygen Cardiovascular status: stable and blood pressure returned to baseline Postop Assessment: no apparent nausea or vomiting Anesthetic complications: no    Last Vitals:  Vitals:   11/29/17 1200 11/29/17 1231  BP: (!) 149/61 (!) 141/63  Pulse: 60 73  Resp: (!) 23 20  Temp:  36.5 C  SpO2: 92% 95%    Last Pain:  Vitals:   11/29/17 1231  TempSrc: Oral  PainSc: 0-No pain                 Tiajuana Amass

## 2017-11-29 NOTE — Brief Op Note (Signed)
11/29/2017  11:37 AM  PATIENT:  Fernando Walker  84 y.o. male  PRE-OPERATIVE DIAGNOSIS:  CONTRACTURE LEFT SMALL FINGER  POST-OPERATIVE DIAGNOSIS:  CONTRACTURE LEFT SMALL FINGER  PROCEDURE:  Procedure(s) with comments: SEGMENTAL FASCIECTOMY LEFT SMALL FINGER (Left) - block and MAC  SURGEON:  Surgeon(s) and Role:    Daryll Brod, MD - Primary  PHYSICIAN ASSISTANT:   ASSISTANTS: none   ANESTHESIA:   regional and IV sedation  EBL:  None BLOOD ADMINISTERED:none  DRAINS: none   LOCAL MEDICATIONS USED:  NONE  SPECIMEN:  Excision  DISPOSITION OF SPECIMEN:  PATHOLOGY  COUNTS:  YES  TOURNIQUET:   Total Tourniquet Time Documented: Upper Arm (Left) - 19 minutes Total: Upper Arm (Left) - 19 minutes   DICTATION: .Other Dictation: Dictation Number [id number]  PLAN OF CARE: Discharge to home after PACU  PATIENT DISPOSITION:  PACU - hemodynamically stable.

## 2017-11-29 NOTE — Anesthesia Preprocedure Evaluation (Addendum)
Anesthesia Evaluation  Patient identified by MRN, date of birth, ID band Patient awake    Reviewed: Allergy & Precautions, NPO status , Patient's Chart, lab work & pertinent test results  Airway Mallampati: II  TM Distance: >3 FB Neck ROM: Full    Dental  (+) Dental Advisory Given   Pulmonary former smoker,    breath sounds clear to auscultation       Cardiovascular hypertension, + CAD, + CABG and + Peripheral Vascular Disease  + Valvular Problems/Murmurs AS  Rhythm:Regular Rate:Normal  10/2017 Echo: EF 65% Normal LVH, mild AS and grade 1 DD. Compared to study of 2016  there is no significant change in mean AV gradient (60mHg in  2016 and 148mg this study).   Neuro/Psych negative neurological ROS     GI/Hepatic Neg liver ROS, GERD  ,  Endo/Other  negative endocrine ROS  Renal/GU negative Renal ROS     Musculoskeletal   Abdominal   Peds  Hematology negative hematology ROS (+)   Anesthesia Other Findings   Reproductive/Obstetrics                            Anesthesia Physical Anesthesia Plan  ASA: III  Anesthesia Plan: MAC and Regional   Post-op Pain Management:  Regional for Post-op pain   Induction: Intravenous  PONV Risk Score and Plan: 1 and Treatment may vary due to age or medical condition, Ondansetron and Propofol infusion  Airway Management Planned: Natural Airway and Simple Face Mask  Additional Equipment:   Intra-op Plan:   Post-operative Plan:   Informed Consent: I have reviewed the patients History and Physical, chart, labs and discussed the procedure including the risks, benefits and alternatives for the proposed anesthesia with the patient or authorized representative who has indicated his/her understanding and acceptance.     Plan Discussed with: CRNA  Anesthesia Plan Comments:         Anesthesia Quick Evaluation

## 2017-11-29 NOTE — Progress Notes (Signed)
Assisted Dr. Rob Fitzgerald with left, ultrasound guided, supraclavicular block. Side rails up, monitors on throughout procedure. See vital signs in flow sheet. Tolerated Procedure well. 

## 2017-11-29 NOTE — Op Note (Signed)
Other Dictation: Dictation Number [id number]

## 2017-11-29 NOTE — Anesthesia Procedure Notes (Signed)
Anesthesia Regional Block: Axillary brachial plexus block   Pre-Anesthetic Checklist: ,, timeout performed, Correct Patient, Correct Site, Correct Laterality, Correct Procedure, Correct Position, site marked, Risks and benefits discussed,  Surgical consent,  Pre-op evaluation,  At surgeon's request and post-op pain management  Laterality: Left  Prep: chloraprep       Needles:  Injection technique: Single-shot  Needle Type: Echogenic Stimulator Needle     Needle Length: 9cm  Needle Gauge: 21     Additional Needles:   Procedures:, nerve stimulator,,, ultrasound used (permanent image in chart),,,,   Nerve Stimulator or Paresthesia:  Response: ulnar, median and radial responses, 0.5 mA,   Additional Responses:   Narrative:  Start time: 11/29/2017 10:33 AM End time: 11/29/2017 10:40 AM Injection made incrementally with aspirations every 5 mL.  Performed by: Personally  Anesthesiologist: Suzette Battiest, MD

## 2017-11-30 ENCOUNTER — Encounter (HOSPITAL_BASED_OUTPATIENT_CLINIC_OR_DEPARTMENT_OTHER): Payer: Self-pay | Admitting: Orthopedic Surgery

## 2018-01-18 ENCOUNTER — Other Ambulatory Visit: Payer: Medicare Other | Admitting: *Deleted

## 2018-01-18 DIAGNOSIS — E785 Hyperlipidemia, unspecified: Secondary | ICD-10-CM

## 2018-01-18 LAB — LIPID PANEL
CHOLESTEROL TOTAL: 149 mg/dL (ref 100–199)
Chol/HDL Ratio: 2.2 ratio (ref 0.0–5.0)
HDL: 69 mg/dL (ref >39)
LDL Calculated: 72 mg/dL (ref 0–99)
TRIGLYCERIDES: 42 mg/dL (ref 0–149)
VLDL Cholesterol Cal: 8 mg/dL (ref 5–40)

## 2018-01-18 LAB — HEPATIC FUNCTION PANEL
ALBUMIN: 4.1 g/dL (ref 3.5–4.7)
ALK PHOS: 45 IU/L (ref 39–117)
ALT: 18 IU/L (ref 0–44)
AST: 18 IU/L (ref 0–40)
BILIRUBIN TOTAL: 0.7 mg/dL (ref 0.0–1.2)
BILIRUBIN, DIRECT: 0.24 mg/dL (ref 0.00–0.40)
Total Protein: 6.6 g/dL (ref 6.0–8.5)

## 2018-01-19 ENCOUNTER — Other Ambulatory Visit: Payer: Self-pay | Admitting: *Deleted

## 2018-01-19 MED ORDER — SIMVASTATIN 20 MG PO TABS: 20.0000 mg | ORAL_TABLET | Freq: Every day | ORAL | 3 refills | Status: DC

## 2018-01-19 NOTE — Telephone Encounter (Signed)
Pt is to continue

## 2018-01-19 NOTE — Telephone Encounter (Signed)
Received fax from [facility] requesting a refill on simvastatin for the patient. Just wanted to clarify that the patient will continue this medication as when it was ordered on 10/26/17 there was an end date put in, 01/24/18 after 90 doses. Please advise. Thanks, MI

## 2018-02-24 ENCOUNTER — Ambulatory Visit (INDEPENDENT_AMBULATORY_CARE_PROVIDER_SITE_OTHER): Payer: Medicare Other | Admitting: *Deleted

## 2018-02-24 VITALS — BP 136/62 | HR 63 | Resp 18 | Ht 75.0 in | Wt 214.0 lb

## 2018-02-24 DIAGNOSIS — Z0000 Encounter for general adult medical examination without abnormal findings: Secondary | ICD-10-CM | POA: Diagnosis not present

## 2018-02-24 NOTE — Patient Instructions (Addendum)
Continue doing brain stimulating activities (puzzles, reading, adult coloring books, staying active) to keep memory sharp.   Continue to eat heart healthy diet (full of fruits, vegetables, whole grains, lean protein, water--limit salt, fat, and sugar intake) and increase physical activity as tolerated.   [name] , Thank you for taking time to come for your Medicare Wellness Visit. I appreciate your ongoing commitment to your health goals. Please review the following plan we discussed and let me know if I can assist you in the future.   These are the goals we discussed: Goals    . Patient Stated     Take some time for me to do some things that I dearly love. Stay as healthy and as independent as possible.       This is a list of the screening recommended for you and due dates:  Health Maintenance  Topic Date Due  . Tetanus Vaccine  02/10/1952  . Flu Shot  05/18/2018  . Pneumonia vaccines  Completed     Health Maintenance, Male A healthy lifestyle and preventive care is important for your health and wellness. Ask your health care provider about what schedule of regular examinations is right for you. What should I know about weight and diet? Eat a Healthy Diet  Eat plenty of vegetables, fruits, whole grains, low-fat dairy products, and lean protein.  Do not eat a lot of foods high in solid fats, added sugars, or salt.  Maintain a Healthy Weight Regular exercise can help you achieve or maintain a healthy weight. You should:  Do at least 150 minutes of exercise each week. The exercise should increase your heart rate and make you sweat (moderate-intensity exercise).  Do strength-training exercises at least twice a week.  Watch Your Levels of Cholesterol and Blood Lipids  Have your blood tested for lipids and cholesterol every 5 years starting at 82 years of age. If you are at high risk for heart disease, you should start having your blood tested when you are 82 years old.  You may need to have your cholesterol levels checked more often if: ? Your lipid or cholesterol levels are high. ? You are older than 82 years of age. ? You are at high risk for heart disease.  What should I know about cancer screening? Many types of cancers can be detected early and may often be prevented. Lung Cancer  You should be screened every year for lung cancer if: ? You are a current smoker who has smoked for at least 30 years. ? You are a former smoker who has quit within the past 15 years.  Talk to your health care provider about your screening options, when you should start screening, and how often you should be screened.  Colorectal Cancer  Routine colorectal cancer screening usually begins at 82 years of age and should be repeated every 5-10 years until you are 82 years old. You may need to be screened more often if early forms of precancerous polyps or small growths are found. Your health care provider may recommend screening at an earlier age if you have risk factors for colon cancer.  Your health care provider may recommend using home test kits to check for hidden blood in the stool.  A small camera at the end of a tube can be used to examine your colon (sigmoidoscopy or colonoscopy). This checks for the earliest forms of colorectal cancer.  Prostate and Testicular Cancer  Depending on your age and overall health,  your health care provider may do certain tests to screen for prostate and testicular cancer.  Talk to your health care provider about any symptoms or concerns you have about testicular or prostate cancer.  Skin Cancer  Check your skin from head to toe regularly.  Tell your health care provider about any new moles or changes in moles, especially if: ? There is a change in a mole's size, shape, or color. ? You have a mole that is larger than a pencil eraser.  Always use sunscreen. Apply sunscreen liberally and repeat throughout the day.  Protect  yourself by wearing long sleeves, pants, a wide-brimmed hat, and sunglasses when outside.  What should I know about heart disease, diabetes, and high blood pressure?  If you are 62-81 years of age, have your blood pressure checked every 3-5 years. If you are 21 years of age or older, have your blood pressure checked every year. You should have your blood pressure measured twice-once when you are at a hospital or clinic, and once when you are not at a hospital or clinic. Record the average of the two measurements. To check your blood pressure when you are not at a hospital or clinic, you can use: ? An automated blood pressure machine at a pharmacy. ? A home blood pressure monitor.  Talk to your health care provider about your target blood pressure.  If you are between 59-27 years old, ask your health care provider if you should take aspirin to prevent heart disease.  Have regular diabetes screenings by checking your fasting blood sugar level. ? If you are at a normal weight and have a low risk for diabetes, have this test once every three years after the age of 25. ? If you are overweight and have a high risk for diabetes, consider being tested at a younger age or more often.  A one-time screening for abdominal aortic aneurysm (AAA) by ultrasound is recommended for men aged 80-75 years who are current or former smokers. What should I know about preventing infection? Hepatitis B If you have a higher risk for hepatitis B, you should be screened for this virus. Talk with your health care provider to find out if you are at risk for hepatitis B infection. Hepatitis C Blood testing is recommended for:  Everyone born from [birth year] through 1963.  Anyone with known risk factors for hepatitis C.  Sexually Transmitted Diseases (STDs)  You should be screened each year for STDs including gonorrhea and chlamydia if: ? You are sexually active and are younger than 82 years of age. ? You are older than 82  years of age and your health care provider tells you that you are at risk for this type of infection. ? Your sexual activity has changed since you were last screened and you are at an increased risk for chlamydia or gonorrhea. Ask your health care provider if you are at risk.  Talk with your health care provider about whether you are at high risk of being infected with HIV. Your health care provider may recommend a prescription medicine to help prevent HIV infection.  What else can I do?  Schedule regular health, dental, and eye exams.  Stay current with your vaccines (immunizations).  Do not use any tobacco products, such as cigarettes, chewing tobacco, and e-cigarettes. If you need help quitting, ask your health care provider.  Limit alcohol intake to no more than 2 drinks per day. One drink equals 12 ounces of beer, 5 ounces  of wine, or 1 ounces of hard liquor.  Do not use street drugs.  Do not share needles.  Ask your health care provider for help if you need support or information about quitting drugs.  Tell your health care provider if you often feel depressed.  Tell your health care provider if you have ever been abused or do not feel safe at home. This information is not intended to replace advice given to you by your health care provider. Make sure you discuss any questions you have with your health care provider. Document Released: 04/01/2008 Document Revised: 06/02/2016 Document Reviewed: 07/08/2015 Elsevier Interactive Patient Education  [employer].

## 2018-02-24 NOTE — Progress Notes (Addendum)
Subjective:   Fernando Walker is a 84 y.o. male who presents for Medicare Annual/Subsequent preventive examination.  Review of Systems:  No ROS.  Medicare Wellness Visit. Additional risk factors are reflected in the social history.  Cardiac Risk Factors include: advanced age (>37mn, >>51women);dyslipidemia;hypertension;male gender Sleep patterns: feels rested on waking, gets up 1 times nightly to void and sleeps 6-8 hours nightly.    Home Safety/Smoke Alarms: Feels safe in home. Smoke alarms in place.  Living environment; residence and Firearm Safety: 1-story house/ trailer, no firearms., Lives with wife, no needs for DME, good support system Seat Belt Safety/Bike Helmet: Wears seat belt.     Objective:    Vitals: BP 136/62   Pulse 63   Resp 18   Ht _0  (1.905 m)   Wt 214 lb (97.1 kg)   SpO2 99%   BMI 26.75 kg/m   Body mass index is 26.75 kg/m.  Advanced Directives 02/24/2018 11/29/2017 11/10/2017 09/22/2017 06/08/2016  Does Patient Have a Medical Advance Directive? _1   Type of AParamedicof [place]Living will Living will;Healthcare Power of [place] will [place]Living will [place] Creek[place] will  Copy of [place] Chart? No - copy requested No - copy requested - - Yes    Tobacco Social History   Tobacco Use  Smoking Status Former Smoker  . Last attempt to quit: 10/18/1984  . Years since quitting: 33.3  Smokeless Tobacco Former UEngineer, structuralgiven: Not Answered  Past Medical History:  Diagnosis Date  . CAD (coronary artery disease) of artery bypass graft   . Carotid arterial disease (HCC)    Nonobstructive  . Cataract   . Dyslipidemia   . GERD (gastroesophageal reflux disease)   . 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 Hyperlipidemia   . Hypertension   . Hypertrophy of prostate with urinary obstruction and other lower  urinary tract symptoms (LUTS)   . Pneumonia    As a child  . Pneumonia    as child   Past Surgical History:  Procedure Laterality Date  . cataracts     both eyes  . COLONOSCOPY    . colonoscopy with polypectomy  2004  . CORONARY ARTERY BYPASS GRAFT  1995   X 7  . FASCIECTOMY Left 11/29/2017   Procedure: SEGMENTAL FASCIECTOMY LEFT SMALL FINGER;  Surgeon: KDaryll Brod MD;  [facility]  Service: Orthopedics;  Laterality: Left;  block and MAC  . POLYPECTOMY     Family History  Problem Relation Age of Onset  . Stroke Father 850 . Diabetes Neg Hx   . Cancer Neg Hx   . GER disease Neg Hx   . Heart attack Neg Hx   . Colon cancer Neg Hx   . Esophageal cancer Neg Hx   . Stomach cancer Neg Hx   . Heart disease Neg Hx    Social History   Socioeconomic History  . Marital status: Married    Spouse name: Not on file  . Number of children: 2  . Years of education: Not on file  . Highest education level: Not on file  Occupational History  . Not on file  Social Needs  . Financial resource strain: Not hard at all  . Food insecurity:    Worry: Never true    Inability: Never true  . Transportation needs:    Medical:  No    Non-medical: No  Tobacco Use  . Smoking status: Former Smoker    Last attempt to quit: 10/18/1984    Years since quitting: 33.3  . Smokeless tobacco: Former Network engineer and Sexual Activity  . Alcohol use: Yes    Alcohol/week: 4.2 oz    Types: 7 Glasses of Kev[place] Qu per week  . Drug use: No  . Sexual activity: Not Currently  Lifestyle  . Physical activity:    Days per week: 5 days    Minutes per session: 50 min  . Stress: Not at all  Relationships  . Social connections:    Talks on phone: More than three times a week    Gets together: More than three times a week    Attends religious service: More than 4 times per year    Active member of club or organization: Yes    Attends meetings of clubs or organizations: More than 4 times  per year    Relationship status: Married  Other Topics Concern  . Not on file  Social History Narrative  . Not on file    Outpatient Encounter Medications as of 02/24/2018  Medication Sig  . aspirin 81 MG tablet Take 81 mg by mouth daily.    . bimatoprost (LUMIGAN) 0.03 % ophthalmic solution Place 1 drop into both eyes at bedtime.   . Cyanocobalamin (VITAMIN B-12 PO) Take 500 mg by mouth daily.  . simvastatin (ZOCOR) 20 MG tablet Take 1 tablet (20 mg total) by mouth at bedtime.  . triamcinolone cream (KENALOG) 0.1 % Apply 1 application 2 (two) times daily topically.  . [DISCONTINUED] HYDROcodone-acetaminophen (NORCO) 5-325 MG tablet Take 1 tablet by mouth every 6 (six) hours as needed. (Patient not taking: Reported on 02/24/2018)   No facility-administered encounter medications on file as of 02/24/2018.     Activities of Daily Living In your present state of health, do you have any difficulty performing the following activities: 02/24/2018 11/15/2017  Hearing? N N  Vision? N N  Difficulty concentrating or making decisions? N N  Walking or climbing stairs? N N  Dressing or bathing? N N  Doing errands, shopping? N -  Preparing Food and eating ? N -  Using the Toilet? N -  In the past six months, have you accidently leaked urine? N -  Do you have problems with loss of bowel control? N -  Managing your Medications? N -  Managing your Finances? N -  Housekeeping or managing your Housekeeping? N -  Some recent data might be hidden    Patient Care Team: Binnie Rail, MD as PCP - General (Internal Medicine) Burnell Blanks, MD as PCP - Cardiology (Cardiology)   Assessment:   This is a routine wellness examination for Fernando Walker. Physical assessment deferred to PCP.   Exercise Activities and Dietary recommendations Current Exercise Habits: The patient does not participate in regular exercise at present(Patient stays active routinely by care for 2 acre lot and doing all  house-hold tasks), Exercise limited by: None identified  Diet (meal preparation, eat out, water intake, caffeinated beverages, dairy products, fruits and vegetables): in general, a "healthy" diet  , well balanced   Reviewed heart healthy diet, encouraged patient to increase daily water intake.  Goals    . Patient Stated     Take some time for me to do some things that I dearly love. Stay as healthy and as independent as possible.  Fall Risk Fall Risk  02/24/2018 07/20/2017 06/14/2016 11/14/2014 11/12/2013  Falls in the past year? _0   Comment - - Emmi Telephone Survey: data to providers prior to load - -   Depression Screen PHQ 2/9 Scores 02/24/2018 07/20/2017 11/14/2014 11/12/2013  PHQ - 2 Score 0 0 0 0  PHQ- 9 Score 0 - - -    Cognitive Function MMSE - Mini Mental State Exam 02/24/2018 06/08/2016  Not completed: - (No Data)  Orientation to time 5 -  Orientation to Place 5 -  Registration 3 -  Attention/ Calculation 5 -  Recall 1 -  Language- name 2 objects 2 -  Language- repeat 1 -  Language- follow 3 step command 3 -  Language- read & follow direction 1 -  Write a sentence 1 -  Copy design 1 -  Total score 28 -        Immunization History  Administered Date(s) Administered  . Influenza Split 07/27/2011, 08/03/2012  . Influenza Whole 09/07/2007, 07/24/2008, 07/23/2009, 08/07/2010  . Influenza, High Dose Seasonal PF 08/01/2013, 07/20/2017  . Influenza,inj,Quad PF,6+ Mos 06/26/2014, 08/04/2015  . Pneumococcal Conjugate-13 11/03/2015  . Pneumococcal Polysaccharide-23 09/13/2012   Screening Tests Health Maintenance  Topic Date Due  . TETANUS/TDAP  02/10/1952  . INFLUENZA VACCINE  05/18/2018  . PNA vac Low Risk Adult  Completed       Plan:    Continue doing brain stimulating activities (puzzles, reading, adult coloring books, staying active) to keep memory sharp.   Continue to eat heart healthy diet (full of fruits, vegetables, whole grains, lean  protein, water--limit salt, fat, and sugar intake) and increase physical activity as tolerated.  I have personally reviewed and noted the following in the patient's chart:   . Medical and social history . Use of alcohol, tobacco or illicit drugs  . Current medications and supplements . Functional ability and status . Nutritional status . Physical activity . Advanced directives . List of other physicians . Vitals . Screenings to include cognitive, depression, and falls . Referrals and appointments  In addition, I have reviewed and discussed with patient certain preventive protocols, quality metrics, and best practice recommendations. A written personalized care plan for preventive services as well as general preventive health recommendations were provided to patient.     Michiel Cowboy, RN  02/24/2018   Medical screening examination/treatment/procedure(s) were performed by non-physician practitioner and as supervising physician I was immediately available for consultation/collaboration. I agree with above. Binnie Rail, MD

## 2018-02-27 DIAGNOSIS — H401131 Primary open-angle glaucoma, bilateral, mild stage: Secondary | ICD-10-CM | POA: Diagnosis not present

## 2018-05-30 DIAGNOSIS — H401131 Primary open-angle glaucoma, bilateral, mild stage: Secondary | ICD-10-CM | POA: Diagnosis not present

## 2018-07-04 ENCOUNTER — Ambulatory Visit (INDEPENDENT_AMBULATORY_CARE_PROVIDER_SITE_OTHER): Payer: Medicare Other

## 2018-07-04 DIAGNOSIS — Z23 Encounter for immunization: Secondary | ICD-10-CM

## 2018-08-05 ENCOUNTER — Encounter: Payer: Self-pay | Admitting: Internal Medicine

## 2018-08-31 ENCOUNTER — Encounter: Payer: Self-pay | Admitting: Internal Medicine

## 2018-09-04 DIAGNOSIS — H401131 Primary open-angle glaucoma, bilateral, mild stage: Secondary | ICD-10-CM | POA: Diagnosis not present

## 2018-09-04 DIAGNOSIS — Z961 Presence of intraocular lens: Secondary | ICD-10-CM | POA: Diagnosis not present

## 2018-09-19 NOTE — Progress Notes (Signed)
Subjective:    Patient ID: Fernando Walker, male    DOB: [birth year], 84 y.o.   MRN: [id number]  HPI The patient is here for an acute visit.   Diarrhea:  It started one week ago.  It started about midnight and he had diarrhea.  He had diarrhea all day Thursday and Friday morning.  He took imodium starting Friday and also took it Saturday, which was the last time he took it.  Monday and Tuesday morning he had diarrhea.  Today he had some gas, but no diarrhea.  During these past several days he did have chills a couple of times.  During this past week he has had chills on a few occasions, mild nausea and abdominal pain and cramping that he thinks was mostly related to gas.  He denies any fevers, change in appetite, cold symptoms, blood in the stool, headaches or lightheadedness.  Today he feels much better and is not sure if he needs to be here.  He has been eating a bland diet and trying to keep up with his fluids.      Medications and allergies reviewed with patient and updated if appropriate.  Patient Active Problem List   Diagnosis Date Noted  . Pneumonia   . Hypertension   . GERD (gastroesophageal reflux disease)   . Dyslipidemia   . Carotid arterial disease ([place])   . CAD (coronary artery disease) of artery bypass graft   . Dupuytren's contracture of left hand 07/20/2017  . Skin abnormality 07/20/2017  . 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 Hx of adenomatous colonic polyps 07/27/2011  . Carotid bruit 06/02/2011  . FASTING HYPERGLYCEMIA 11/10/2009  . Essential hypertension 04/29/2009  . CAD, ARTERY BYPASS GRAFT 04/29/2009  . MYOCARDIAL PERFUSION SCAN, WITH STRESS TEST, ABNORMAL 04/29/2009  . Hyperlipidemia 04/24/2009  . HYPERTROPHY PROSTATE W/UR OBST & OTH LUTS 04/22/2009    Current Outpatient Medications on File Prior to Visit  Medication Sig Dispense Refill  . aspirin 81 MG tablet Take 81 mg by mouth daily.        . bimatoprost (LUMIGAN) 0.03 % ophthalmic solution Place 1 drop into both eyes at bedtime.     . Cyanocobalamin (VITAMIN B-12 PO) Take 500 mg by mouth daily.    . simvastatin (ZOCOR) 20 MG tablet Take 1 tablet (20 mg total) by mouth at bedtime. 90 tablet 3  . triamcinolone cream (KENALOG) 0.1 % Apply 1 application 2 (two) times daily topically.     No current facility-administered medications on file prior to visit.     Past Medical History:  Diagnosis Date  . CAD (coronary artery disease) of artery bypass graft   . Carotid arterial disease (HCC)    Nonobstructive  . Cataract   . Dyslipidemia   . GERD (gastroesophageal reflux disease)   . 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 Hyperlipidemia   . Hypertension   . Hypertrophy of prostate with urinary obstruction and other lower urinary tract symptoms (LUTS)   . Pneumonia    As a child  . Pneumonia    as child    Past Surgical History:  Procedure Laterality Date  . cataracts     both eyes  . COLONOSCOPY    . colonoscopy with polypectomy  2004  . CORONARY ARTERY BYPASS GRAFT  1995   X 7  . FASCIECTOMY Left 11/29/2017   Procedure: SEGMENTAL FASCIECTOMY LEFT SMALL FINGER;  Surgeon: Daryll Brod, MD;  Location:  [facility];  Service: Orthopedics;  Laterality: Left;  block and MAC  . POLYPECTOMY      Social History   Socioeconomic History  . Marital status: Married    Spouse name: Not on file  . Number of children: 2  . Years of education: Not on file  . Highest education level: Not on file  Occupational History  . Not on file  Social Needs  . Financial resource strain: Not hard at all  . Food insecurity:    Worry: Never true    Inability: Never true  . Transportation needs:    Medical: No    Non-medical: No  Tobacco Use  . Smoking status: Former Smoker    Last attempt to quit: 10/18/1984    Years since quitting: 33.9  . Smokeless tobacco: Former Network engineer and Sexual Activity  . Alcohol use: Yes     Alcohol/week: 7.0 standard drinks    Types: 7 Glasses of wine per week  . Drug use: No  . Sexual activity: Not Currently  Lifestyle  . Physical activity:    Days per week: 5 days    Minutes per session: 50 min  . Stress: Not at all  Relationships  . Social connections:    Talks on phone: More than three times a week    Gets together: More than three times a week    Attends religious service: More than 4 times per year    Active member of club or organization: Yes    Attends meetings of clubs or organizations: More than 4 times per year    Relationship status: Married  Other Topics Concern  . Not on file  Social History Narrative  . Not on file    Family History  Problem Relation Age of Onset  . Stroke Father 71  . Diabetes Neg Hx   . Cancer Neg Hx   . GER disease Neg Hx   . Heart attack Neg Hx   . Colon cancer Neg Hx   . Esophageal cancer Neg Hx   . Stomach cancer Neg Hx   . Heart disease Neg Hx     Review of Systems  Constitutional: Positive for chills. Negative for appetite change and fever.  HENT: Negative for congestion, ear pain, sinus pain and sore throat.   Respiratory: Negative for cough, shortness of breath and wheezing.   Gastrointestinal: Positive for abdominal pain (gas pains, cramping), diarrhea and nausea (a couple of times). Negative for blood in stool and vomiting.       No gerd  Neurological: Negative for light-headedness and headaches.       Objective:   Vitals:   09/20/18 1023  BP: 124/72  Pulse: 66  Resp: 16  Temp: 98.4 F (36.9 C)  SpO2: 92%   BP Readings from Last 3 Encounters:  09/20/18 124/72  02/24/18 136/62  11/29/17 (!) 141/63   Wt Readings from Last 3 Encounters:  09/20/18 213 lb (96.6 kg)  02/24/18 214 lb (97.1 kg)  11/29/17 210 lb (95.3 kg)   Body mass index is 26.62 kg/m.   Physical Exam  Constitutional: He appears well-developed and well-nourished. No distress.  HENT:  Head: Normocephalic and atraumatic.    Cardiovascular: Normal rate and regular rhythm.  Pulmonary/Chest: Effort normal and breath sounds normal. No respiratory distress. He has no wheezes. He has no rales.  Abdominal: Soft. Bowel sounds are normal. He exhibits no distension and no mass. There is no tenderness. There  is no guarding.  Musculoskeletal: He exhibits no edema.  Skin: Skin is warm and dry. He is not diaphoretic.           Assessment & Plan:    See Problem List for Assessment and Plan of chronic medical problems.

## 2018-09-20 ENCOUNTER — Ambulatory Visit (INDEPENDENT_AMBULATORY_CARE_PROVIDER_SITE_OTHER): Payer: Medicare Other | Admitting: Internal Medicine

## 2018-09-20 ENCOUNTER — Encounter

## 2018-09-20 ENCOUNTER — Encounter: Payer: Self-pay | Admitting: Internal Medicine

## 2018-09-20 VITALS — BP 124/72 | HR 66 | Temp 98.4°F | Resp 16 | Ht 75.0 in | Wt 213.0 lb

## 2018-09-20 DIAGNOSIS — A09 Infectious gastroenteritis and colitis, unspecified: Secondary | ICD-10-CM

## 2018-09-20 DIAGNOSIS — I6523 Occlusion and stenosis of bilateral carotid arteries: Secondary | ICD-10-CM

## 2018-09-20 NOTE — Assessment & Plan Note (Signed)
Likely viral gastroenteritis Symptoms have much improved over the past several days and will likely continue to improve with conservative treatment No need for further evaluation or treatment Continue bland diet, increase fluids Okay to take Imodium as needed Increase rest Call if symptoms do not continue to improve/resolve

## 2018-09-20 NOTE — Patient Instructions (Addendum)
Continue increased fluids and a bland diet.  Take imodium as needed.  Call if symptoms do not resolve completely.      Viral Gastroenteritis, Adult Viral gastroenteritis is also known as the stomach flu. This condition is caused by various viruses. These viruses can be passed from person to person very easily (are very contagious). This condition may affect your stomach, small intestine, and large intestine. It can cause sudden watery diarrhea, fever, and vomiting. Diarrhea and vomiting can make you feel weak and cause you to become dehydrated. You may not be able to keep fluids down. Dehydration can make you tired and thirsty, cause you to have a dry mouth, and decrease how often you urinate. Older adults and people with other diseases or a weak immune system are at higher risk for dehydration. It is important to replace the fluids that you lose from diarrhea and vomiting. If you become severely dehydrated, you may need to get fluids through an IV tube. What are the causes? Gastroenteritis is caused by various viruses, including rotavirus and norovirus. Norovirus is the most common cause in adults. You can get sick by eating food, drinking water, or touching a surface contaminated with one of these viruses. You can also get sick from sharing utensils or other personal items with an infected person. What increases the risk? This condition is more likely to develop in people:  Who have a weak defense system (immune system).  Who live with one or more children who are younger than 59 years old.  Who live in a nursing home.  Who go on cruise ships.  What are the signs or symptoms? Symptoms of this condition start suddenly 1-2 days after exposure to a virus. Symptoms may last a few days or as long as a week. The most common symptoms are watery diarrhea and vomiting. Other symptoms include:  Fever.  Headache.  Fatigue.  Pain in the abdomen.  Chills.  Weakness.  Nausea.  Muscle  aches.  Loss of appetite.  How is this diagnosed? This condition is diagnosed with a medical history and physical exam. You may also have a stool test to check for viruses or other infections. How is this treated? This condition typically goes away on its own. The focus of treatment is to restore lost fluids (rehydration). Your health care provider may recommend that you take an oral rehydration solution (ORS) to replace important salts and minerals (electrolytes) in your body. Severe cases of this condition may require giving fluids through an IV tube. Treatment may also include medicine to help with your symptoms. Follow these instructions at home: Follow instructions from your health care provider about how to care for yourself at home. Eating and drinking Follow these recommendations as told by your health care provider:  Take an ORS. This is a drink that is sold at pharmacies and retail stores.  Drink clear fluids in small amounts as you are able. Clear fluids include water, ice chips, diluted fruit juice, and low-calorie sports drinks.  Eat bland, easy-to-digest foods in small amounts as you are able. These foods include bananas, applesauce, rice, lean meats, toast, and crackers.  Avoid fluids that contain a lot of sugar or caffeine, such as energy drinks, sports drinks, and soda.  Avoid alcohol.  Avoid spicy or fatty foods.  General instructions   Drink enough fluid to keep your urine clear or pale yellow.  Wash your hands often. If soap and water are not available, use hand sanitizer.  Make  sure that all people in your household wash their hands well and often.  Take over-the-counter and prescription medicines only as told by your health care provider.  Rest at home while you recover.  Watch your condition for any changes.  Take a warm bath to relieve any burning or pain from frequent diarrhea episodes.  Keep all follow-up visits as told by your health care  provider. This is important. Contact a health care provider if:  You cannot keep fluids down.  Your symptoms get worse.  You have new symptoms.  You feel light-headed or dizzy.  You have muscle cramps. Get help right away if:  You have chest pain.  You feel extremely weak or you faint.  You see blood in your vomit.  Your vomit looks like coffee grounds.  You have bloody or black stools or stools that look like tar.  You have a severe headache, a stiff neck, or both.  You have a rash.  You have severe pain, cramping, or bloating in your abdomen.  You have trouble breathing or you are breathing very quickly.  Your heart is beating very quickly.  Your skin feels cold and clammy.  You feel confused.  You have pain when you urinate.  You have signs of dehydration, such as: ? Dark urine, very little urine, or no urine. ? Cracked lips. ? Dry mouth. ? Sunken eyes. ? Sleepiness. ? Weakness. This information is not intended to replace advice given to you by your health care provider. Make sure you discuss any questions you have with your health care provider. Document Released: 10/04/2005 Document Revised: 03/17/2016 Document Reviewed: 06/10/2015 Elsevier Interactive Patient Education  [employer].

## 2018-10-26 ENCOUNTER — Ambulatory Visit: Payer: Medicare Other | Admitting: Cardiovascular Disease

## 2018-12-13 ENCOUNTER — Ambulatory Visit (INDEPENDENT_AMBULATORY_CARE_PROVIDER_SITE_OTHER): Payer: Medicare Other | Admitting: Cardiovascular Disease

## 2018-12-13 ENCOUNTER — Encounter: Payer: Self-pay | Admitting: Cardiovascular Disease

## 2018-12-13 VITALS — BP 140/80 | HR 76 | Ht 75.0 in | Wt 216.1 lb

## 2018-12-13 DIAGNOSIS — I10 Essential (primary) hypertension: Secondary | ICD-10-CM

## 2018-12-13 DIAGNOSIS — I350 Nonrheumatic aortic (valve) stenosis: Secondary | ICD-10-CM

## 2018-12-13 DIAGNOSIS — I2510 Atherosclerotic heart disease of native coronary artery without angina pectoris: Secondary | ICD-10-CM | POA: Diagnosis not present

## 2018-12-13 DIAGNOSIS — I6523 Occlusion and stenosis of bilateral carotid arteries: Secondary | ICD-10-CM | POA: Diagnosis not present

## 2018-12-13 DIAGNOSIS — E7800 Pure hypercholesterolemia, unspecified: Secondary | ICD-10-CM

## 2018-12-13 NOTE — Progress Notes (Signed)
Chief Complaint  Patient presents with  . Follow-up    CAD   History of Present Illness: 85 yo male with history of CAD, HTN and HLD here today for cardiac follow up. He underwent 7V CABG in 1995. Last cath 2010 with 7 patents grafts with total proximal occlusion of all native vessels. Carotid artery dopplers November 2017 with mild bilateral disease. Stress myoview 06/27/13 in setting of dyspnea with inferior wall scar with peri-infarct ischemia, intermediate risk study. I saw him in f/u and he stated that he did not have chest pain. His dyspnea was stable. He did not wish to pursue a cardiac cath. Echo January 2019 with normal LV function, LVEF=65-70%,  mild AS (mean gradient 13 mmHg).   He is here today for follow up. The patient denies any dyspnea, palpitations, lower extremity edema, orthopnea, PND, dizziness, near syncope or syncope. He has rare chest pains with moderate exertion.    Primary Care Physician: Binnie Rail, MD  Past Medical History:  Diagnosis Date  . CAD (coronary artery disease) of artery bypass graft   . Carotid arterial disease (HCC)    Nonobstructive  . Cataract   . Dyslipidemia   . GERD (gastroesophageal reflux disease)   . 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 Hyperlipidemia   . Hypertension   . Hypertrophy of prostate with urinary obstruction and other lower urinary tract symptoms (LUTS)   . Pneumonia    As a child  . Pneumonia    as child    Past Surgical History:  Procedure Laterality Date  . cataracts     both eyes  . COLONOSCOPY    . colonoscopy with polypectomy  2004  . CORONARY ARTERY BYPASS GRAFT  1995   X 7  . FASCIECTOMY Left 11/29/2017   Procedure: SEGMENTAL FASCIECTOMY LEFT SMALL FINGER;  Surgeon: Daryll Brod, MD;  Location: [place];  Service: Orthopedics;  Laterality: Left;  block and MAC  . POLYPECTOMY      Current Outpatient Medications  Medication Sig Dispense Refill  . aspirin 81 MG tablet Take 81 mg by mouth  daily.      . bimatoprost (LUMIGAN) 0.03 % ophthalmic solution Place 1 drop into both eyes at bedtime.     . Cyanocobalamin (VITAMIN B-12 PO) Take 500 mg by mouth daily.    . simvastatin (ZOCOR) 20 MG tablet Take 1 tablet (20 mg total) by mouth at bedtime. 90 tablet 3  . triamcinolone cream (KENALOG) 0.1 % Apply 1 application 2 (two) times daily topically.     No current facility-administered medications for this visit.     Allergies  Allergen Reactions  . Sulfonamide Derivatives     rash    Social History   Socioeconomic History  . Marital status: Married    Spouse name: Not on file  . Number of children: 2  . Years of education: Not on file  . Highest education level: Not on file  Occupational History  . Not on file  Social Needs  . Financial resource strain: Not hard at all  . Food insecurity:    Worry: Never true    Inability: Never true  . Transportation needs:    Medical: No    Non-medical: No  Tobacco Use  . Smoking status: Former Smoker    Last attempt to quit: 10/18/1984    Years since quitting: 34.1  . Smokeless tobacco: Former Network engineer and Sexual Activity  . Alcohol use: Yes  Alcohol/week: 7.0 standard drinks    Types: 7 Glasses of wine per week  . Drug use: No  . Sexual activity: Not Currently  Lifestyle  . Physical activity:    Days per week: 5 days    Minutes per session: 50 min  . Stress: Not at all  Relationships  . Social connections:    Talks on phone: More than three times a week    Gets together: More than three times a week    Attends religious service: More than 4 times per year    Active member of club or organization: Yes    Attends meetings of clubs or organizations: More than 4 times per year    Relationship status: Married  . Intimate partner violence:    Fear of current or ex partner: No    Emotionally abused: No    Physically abused: No    Forced sexual activity: No  Other Topics Concern  . Not on file  Social History  Narrative  . Not on file    Family History  Problem Relation Age of Onset  . Stroke Father 54  . Diabetes Neg Hx   . Cancer Neg Hx   . GER disease Neg Hx   . Heart attack Neg Hx   . Colon cancer Neg Hx   . Esophageal cancer Neg Hx   . Stomach cancer Neg Hx   . Heart disease Neg Hx     Review of Systems:  As stated in the HPI and otherwise negative.   BP 140/80   Pulse 76   Ht _0  (1.905 m)   Wt 216 lb 1.9 oz (98 kg)   SpO2 97%   BMI 27.01 kg/m   Physical Examination:  General: Well developed, well nourished, NAD  HEENT: OP clear, mucus membranes moist  SKIN: warm, dry. No rashes. Neuro: No focal deficits  Musculoskeletal: Muscle strength 5/5 all ext  Psychiatric: Mood and affect normal  Neck: No JVD, no carotid bruits, no thyromegaly, no lymphadenopathy.  Lungs:Clear bilaterally, no wheezes, rhonci, crackles Cardiovascular: Regular rate and rhythm. Systolic murmur.  Abdomen:Soft. Bowel sounds present. Non-tender.  Extremities: No lower extremity edema. Pulses are 2 + in the bilateral DP/PT.   Echo 10/25/17: - Left ventricle: The cavity size was normal. Systolic function was   vigorous. The estimated ejection fraction was in the range of 65%   to 70%. Wall motion was normal; there were no regional wall   motion abnormalities. There was an increased relative   contribution of atrial contraction to ventricular filling.   Doppler parameters are consistent with abnormal left ventricular   relaxation (grade 1 diastolic dysfunction). - Aortic valve: Severe diffuse calcification involving the left   coronary and noncoronary cusp. Valve mobility was restricted.   There was mild stenosis. Mean gradient (S): 13 mm Hg. - Mitral valve: Calcified annulus. There was trivial regurgitation. - Atrial septum: There was increased thickness of the septum,   consistent with lipomatous hypertrophy.  Impressions:  - Normal LVH, mild AS and grade 1 DD. Compared to study of 2016    there is no significant change in mean AV gradient (86mHg in   2016 and 182mg this study).  Cardiac cath 05/01/09:  The left main coronary artery was free of significant disease.  The left anterior descending artery was completely occluded after 2  septal perforators in its proximal portion.  The circumflex artery was completely occluded in its midportion. There  is a 90%  proximal stenosis. The distal vessel filled faintly via  bridging collaterals.  The right coronary artery was completely occluded near its origin.  The saphenous vein graft to the acute marginal and posterior ascending  branch of the right coronary artery were patent. There was a filling  defect in the mid-to-distal portion of the graft, which did not appear  to be obstructive. There was also a 30-40% narrowing in midportion of  the graft just after the attachment to the acute marginal branch.  The LIMA graft to LAD was patent and functioned normally.  The sequential graft to the marginal and posterolateral branch of  circumflex artery was patent and functioned normally. There were  minimal luminal irregularities in the graft.  The vein graft to the first and second diagonal branch of the LAD was  patent and functioned normally.  The left ventriculogram performed in the RAO projection showed good wall  motion with no areas of hypokinesis. The estimated fraction was 65%.  HEMODYNAMIC DATA: The aortic pressure was 100/59 with a mean of 77.  The left ventricular pressure was 100/10.  EKG:  EKG is  ordered today. The ekg ordered today demonstrates sinus, rate 76 bpm. Non-specific T wave   Recent Labs: 01/18/2018: ALT 18   Lipid Panel    Component Value Date/Time   CHOL 149 01/18/2018 0908   TRIG 42 01/18/2018 0908   HDL 69 01/18/2018 0908   CHOLHDL 2.2 01/18/2018 0908   CHOLHDL 2 08/27/2016 1007   VLDL 11.6 08/27/2016 1007   LDLCALC 72 01/18/2018 0908     Wt Readings from Last 3 Encounters:  12/13/18 216 lb  1.9 oz (98 kg)  09/20/18 213 lb (96.6 kg)  02/24/18 214 lb (97.1 kg)     Other studies Reviewed: Additional studies/ records that were reviewed today include: . Review of the above records demonstrates:   Assessment and Plan:   1. CAD s/p CABG without angina: Pt with known CAD with prior CABG in 1995. Last cath 2010 with 7 patent grafts. He has rare chest pains with moderate exertion. Will continue ASA and statin.      2. HTN: BP is in a normal range. No medications.   3. Hyperlipidemia: LDL at goal. Continue statin. Check lipids and LFTs in April 2020  4. Carotid artery disease: He has mild bilateral carotid artery disease by dopplers in November 2017. No plans to repeat given age and stability of disease over time.   5. Aortic stenosis: Mild by echo January 2019. Will repeat echo January 2021.     Current medicines are reviewed at length with the patient today.  The patient does not have concerns regarding medicines.  The following changes have been made:  no change  Labs/ tests ordered today include:   Orders Placed This Encounter  Procedures  . Lipid Profile  . Comp Met (CMET)  . EKG 12-Lead  . ECHOCARDIOGRAM COMPLETE    Disposition:   FU with me in 12  months  Signed, Lauree Chandler, MD 12/13/2018 10:51 AM    [facility] [place], [place], [place]  [address] Phone: [phone]; Fax: [phone]

## 2018-12-13 NOTE — Patient Instructions (Signed)
Medication Instructions:  Your physician recommends that you continue on your current medications as directed. Please refer to the Current Medication list given to you today.  If you need a refill on your cardiac medications before your next appointment, please call your pharmacy.   Lab work: Your physician recommends that you return for lab work in: early April 2020.  CMET and lipid profiles.  This will be fasting.   If you have labs (blood work) drawn today and your tests are completely normal, you will receive your results only by: Marland Kitchen MyChart Message (if you have MyChart) OR . A paper copy in the mail If you have any lab test that is abnormal or we need to change your treatment, we will call you to review the results.  Testing/Procedures: Your physician has requested that you have an echocardiogram. Echocardiography is a painless test that uses sound waves to create images of your heart. It provides your doctor with information about the size and shape of your heart and how well your heart's chambers and valves are working. This procedure takes approximately one hour. There are no restrictions for this procedure. To be done in January 2021    Follow-Up: At [facility], you and your health needs are our priority.  As part of our continuing mission to provide you with exceptional heart care, we have created designated Provider Care Teams.  These Care Teams include your primary Cardiologist (physician) and Advanced Practice Providers (APPs -  Physician Assistants and Nurse Practitioners) who all work together to provide you with the care you need, when you need it. You will need a follow up appointment in 12 months.  Please call our office 4 months in advance to schedule this appointment.  You may see Lauree Chandler, MD or one of the following Advanced Practice Providers on your designated Care Team:   Hagerman, PA-C Melina Copa, PA-C . Ermalinda Barrios, PA-C  Any Other Special  Instructions Will Be Listed Below (If Applicable).

## 2018-12-20 DIAGNOSIS — H401131 Primary open-angle glaucoma, bilateral, mild stage: Secondary | ICD-10-CM | POA: Diagnosis not present

## 2018-12-20 DIAGNOSIS — Z961 Presence of intraocular lens: Secondary | ICD-10-CM | POA: Diagnosis not present

## 2018-12-25 ENCOUNTER — Other Ambulatory Visit: Payer: Self-pay | Admitting: Cardiovascular Disease

## 2019-01-22 ENCOUNTER — Other Ambulatory Visit: Payer: Medicare Other

## 2019-01-22 ENCOUNTER — Other Ambulatory Visit: Payer: Self-pay

## 2019-01-22 DIAGNOSIS — E7800 Pure hypercholesterolemia, unspecified: Secondary | ICD-10-CM

## 2019-01-22 DIAGNOSIS — I2510 Atherosclerotic heart disease of native coronary artery without angina pectoris: Secondary | ICD-10-CM

## 2019-01-22 LAB — COMPREHENSIVE METABOLIC PANEL
ALT: 19 IU/L (ref 0–44)
AST: 21 IU/L (ref 0–40)
Albumin/Globulin Ratio: 2 (ref 1.2–2.2)
Albumin: 4.3 g/dL (ref 3.6–4.6)
Alkaline Phosphatase: 41 IU/L (ref 39–117)
BUN/Creatinine Ratio: 10 (ref 10–24)
BUN: 13 mg/dL (ref 8–27)
Bilirubin Total: 1.2 mg/dL (ref 0.0–1.2)
CO2: 24 mmol/L (ref 20–29)
Calcium: 9.7 mg/dL (ref 8.6–10.2)
Chloride: 103 mmol/L (ref 96–106)
Creatinine, Ser: 1.25 mg/dL (ref 0.76–1.27)
GFR calc Af Amer: 61 mL/min/{1.73_m2} (ref >59)
GFR calc non Af Amer: 53 mL/min/{1.73_m2} — ABNORMAL LOW (ref >59)
Globulin, Total: 2.2 g/dL (ref 1.5–4.5)
Glucose: 117 mg/dL — ABNORMAL HIGH (ref 65–99)
Potassium: 4.4 mmol/L (ref 3.5–5.2)
Sodium: 143 mmol/L (ref 134–144)
Total Protein: 6.5 g/dL (ref 6.0–8.5)

## 2019-01-22 LAB — LIPID PANEL
Chol/HDL Ratio: 2.2 ratio (ref 0.0–5.0)
Cholesterol, Total: 142 mg/dL (ref 100–199)
HDL: 64 mg/dL (ref >39)
LDL Calculated: 67 mg/dL (ref 0–99)
Triglycerides: 56 mg/dL (ref 0–149)
VLDL Cholesterol Cal: 11 mg/dL (ref 5–40)

## 2019-02-08 ENCOUNTER — Other Ambulatory Visit: Payer: Self-pay | Admitting: Internal Medicine

## 2019-02-09 ENCOUNTER — Encounter: Payer: Self-pay | Admitting: Internal Medicine

## 2019-02-09 NOTE — Telephone Encounter (Signed)
I responded and told him that this documentation has to be through Mrs. Maniscalco's my chart. You can probably just respond to this message through her my chart.

## 2019-03-05 NOTE — Progress Notes (Addendum)
Subjective:   Fernando Walker is a 85 y.o. male who presents for Medicare Annual/Subsequent preventive examination.  Review of Systems:  No ROS.  Medicare Wellness Virtual Visit.  Visual/audio telehealth visit, UTA vital signs.   See social history for additional risk factors. I connected with patient by a telephone and verified that I am speaking with the correct person using two identifiers. Patient stated full name and DOB. Patient gave permission to continue with telephonic visit. Patient's location was at home and Nurse's location was at [place] office.  Cardiac Risk Factors include: advanced age (>59mn, >>46women);male gender;dyslipidemia;hypertension Sleep patterns: feels rested on waking, gets up 1-2 times nightly to void and sleeps 6-7 hours nightly.    Home Safety/Smoke Alarms: Feels safe in home. Smoke alarms in place.  Living environment; residence and Firearm Safety: 1-story house/ trailer. Lives with wife, no needs for DME, good support system Seat Belt Safety/Bike Helmet: Wears seat belt.   PSA-  Lab Results  Component Value Date   PSA 0.45 04/05/2017   PSA 0.21 11/12/2013   PSA 0.52 06/08/2010       Objective:    Vitals: There were no vitals taken for this visit.  There is no height or weight on file to calculate BMI.  Advanced Directives 03/06/2019 02/24/2018 11/29/2017 11/10/2017 09/22/2017 06/08/2016  Does Patient Have a Medical Advance Directive? _0  Yes  Type of AParamedicof AJackpotLiving will HOakdaleLiving will Living will;Healthcare Power of AEast PecosLiving will HGregoryLiving will HKenilworthLiving will  Copy of HEast Bernstadtin Chart? No - copy requested No - copy requested No - copy requested - - Yes    Tobacco Social History   Tobacco Use  Smoking Status Former Smoker  . Last attempt to quit: 10/18/1984   . Years since quitting: 34.4  Smokeless Tobacco Former UEngineer, structuralgiven: Not Answered  Past Medical History:  Diagnosis Date  . CAD (coronary artery disease) of artery bypass graft   . Carotid arterial disease (HCC)    Nonobstructive  . Cataract   . Dyslipidemia   . GERD (gastroesophageal reflux disease)   . 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 Hyperlipidemia   . Hypertension   . Hypertrophy of prostate with urinary obstruction and other lower urinary tract symptoms (LUTS)   . Pneumonia    As a child  . Pneumonia    as child   Past Surgical History:  Procedure Laterality Date  . cataracts     both eyes  . COLONOSCOPY    . colonoscopy with polypectomy  2004  . CORONARY ARTERY BYPASS GRAFT  1995   X 7  . FASCIECTOMY Left 11/29/2017   Procedure: SEGMENTAL FASCIECTOMY LEFT SMALL FINGER;  Surgeon: KDaryll Brod MD;  [place] [facility]  Service: Orthopedics;  Laterality: Left;  block and MAC  . POLYPECTOMY     Family History  Problem Relation Age of Onset  . Stroke Father 864 . Diabetes Neg Hx   . Cancer Neg Hx   . GER disease Neg Hx   . Heart attack Neg Hx   . Colon cancer Neg Hx   . Esophageal cancer Neg Hx   . Stomach cancer Neg Hx   . Heart disease Neg Hx    Social History   Socioeconomic History  . Marital status: Married    Spouse name:  Not on file  . Number of children: 2  . Years of education: Not on file  . Highest education level: Not on file  Occupational History  . Occupation: retired  Scientific laboratory technician  . Financial resource strain: Not hard at all  . Food insecurity:    Worry: Never true    Inability: Never true  . Transportation needs:    Medical: No    Non-medical: No  Tobacco Use  . Smoking status: Former Smoker    Last attempt to quit: 10/18/1984    Years since quitting: 34.4  . Smokeless tobacco: Former Network engineer and Sexual Activity  . Alcohol use: Yes    Alcohol/week: 7.0 standard drinks    Types: 7 Glasses of   per week  . Drug use: No  . Sexual activity: Not Currently  Lifestyle  . Physical activity:    Days per week: 4 days    Minutes per session: 50 min  . Stress: Rather much  Relationships  . Social connections:    Talks on phone: More than three times a week    Gets together: More than three times a week    Attends religious service: More than 4 times per year    Active member of club or organization: Yes    Attends meetings of clubs or organizations: More than 4 times per year    Relationship status: Married  Other Topics Concern  . Not on file  Social History Narrative  . Not on file    Outpatient Encounter Medications as of 03/06/2019  Medication Sig  . aspirin 81 MG tablet Take 81 mg by mouth daily.    . bimatoprost (LUMIGAN) 0.03 % ophthalmic solution Place 1 drop into both eyes at bedtime.   . Cyanocobalamin (VITAMIN B-12 PO) Take 500 mg by mouth daily.  . simvastatin (ZOCOR) 20 MG tablet TAKE 1 TABLET BY MOUTH AT  BEDTIME  . triamcinolone cream (KENALOG) 0.1 % Apply 1 application 2 (two) times daily topically.   No facility-administered encounter medications on file as of 03/06/2019.     Activities of Daily Living In your present state of health, do you have any difficulty performing the following activities: 03/06/2019  Hearing? N  Vision? N  Difficulty concentrating or making decisions? N  Walking or climbing stairs? N  Dressing or bathing? N  Doing errands, shopping? N  Preparing Food and eating ? N  Using the Toilet? N  In the past six months, have you accidently leaked urine? N  Do you have problems with loss of bowel control? N  Managing your Medications? N  Managing your Finances? N  Housekeeping or managing your Housekeeping? N  Some recent data might be hidden    Patient Care Team: Binnie Rail, MD as PCP - General (Internal Medicine) Burnell Blanks, MD as PCP - Cardiology (Cardiology)   Assessment:   This is a routine wellness  examination for Fernando Walker. Physical assessment deferred to PCP.  Exercise Activities and Dietary recommendations Current Exercise Habits: The patient does not participate in regular exercise at present(patient maintains home and 2 acre yard), Exercise limited by: None identified Diet (meal preparation, eat out, water intake, caffeinated beverages, dairy products, fruits and vegetables): in general, a "healthy" diet  , well balanced   Reviewed heart healthy and diabetic diet. Encouraged patient to increase daily water and healthy fluid intake.  Goals    . patient     Can review teepa snow online  alzheimers association has the 24/7 helpline  [employer]  [employer]; 6087810955 Senior Directory; Information regarding Long Term Care  Will fup on [place]     . Patient Stated     Take some time for me to do some things that I dearly love. Stay as healthy and as independent as possible.       Fall Risk Fall Risk  03/06/2019 02/24/2018 07/20/2017 06/14/2016 11/14/2014  Falls in the past year? 0 No No No No  Comment - - - Emmi Telephone Survey: data to providers prior to load -  Number falls in past yr: 0 - - - -    Depression Screen PHQ 2/9 Scores 03/06/2019 02/24/2018 07/20/2017 11/14/2014  PHQ - 2 Score 2 0 0 0  PHQ- 9 Score 3 0 - -    Cognitive Function MMSE - Mini Mental State Exam 02/24/2018 06/08/2016  Not completed: - (No Data)  Orientation to time 5 -  Orientation to Place 5 -  Registration 3 -  Attention/ Calculation 5 -  Recall 1 -  Language- name 2 objects 2 -  Language- repeat 1 -  Language- follow 3 step command 3 -  Language- read & follow direction 1 -  Write a sentence 1 -  Copy design 1 -  Total score 28 -        Immunization History  Administered Date(s) Administered  . Influenza Split 07/27/2011, 08/03/2012  . Influenza Whole 09/07/2007, 07/24/2008, 07/23/2009, 08/07/2010  . Influenza, High Dose Seasonal PF  08/01/2013, 07/20/2017, 07/04/2018  . Influenza,inj,Quad PF,6+ Mos 06/26/2014, 08/04/2015  . Pneumococcal Conjugate-13 11/03/2015  . Pneumococcal Polysaccharide-23 09/13/2012   Screening Tests Health Maintenance  Topic Date Due  . TETANUS/TDAP  02/10/1952  . INFLUENZA VACCINE  05/19/2019  . PNA vac Low Risk Adult  Completed        Plan:    Reviewed health maintenance screenings with patient today and relevant education, vaccines, and/or referrals were provided.   Continue to eat heart healthy diet (full of fruits, vegetables, whole grains, lean protein, water--limit salt, fat, and sugar intake) and increase physical activity as tolerated.  Continue doing brain stimulating activities (puzzles, reading, adult coloring books, staying active) to keep memory sharp.   I have personally reviewed and noted the following in the patient's chart:   . Medical and social history . Use of alcohol, tobacco or illicit drugs  . Current medications and supplements . Functional ability and status . Nutritional status . Physical activity . Advanced directives . List of other physicians . Screenings to include cognitive, depression, and falls . Referrals and appointments  In addition, I have reviewed and discussed with patient certain preventive protocols, quality metrics, and best practice recommendations. A written personalized care plan for preventive services as well as general preventive health recommendations were provided to patient.     Michiel Cowboy, RN  03/06/2019   Medical screening examination/treatment/procedure(s) were performed by non-physician practitioner and as supervising physician I was immediately available for consultation/collaboration. I agree with above. Binnie Rail, MD

## 2019-03-06 ENCOUNTER — Ambulatory Visit (INDEPENDENT_AMBULATORY_CARE_PROVIDER_SITE_OTHER): Payer: Medicare Other | Admitting: *Deleted

## 2019-03-06 DIAGNOSIS — Z0000 Encounter for general adult medical examination without abnormal findings: Secondary | ICD-10-CM | POA: Diagnosis not present

## 2019-03-06 NOTE — Patient Instructions (Addendum)
[facility] is available Monday through Friday, 9:00 a.m. - 5:00 p.m. Call Center Specialists provide information and referral services to aging adults 65+ in [place]. If there are waiting lists for community services, or if services are not available, [facility] connects clients with [facility] volunteers, or other caring individuals in the community who provide services such as respite care, wheelchair ramp construction, friendly visits, or transportation assistance.  [facility] Center Specialists consider it an honor and privilege to pray with callers. Contact the Call Center at [phone] or email [email or web address].  Continue to eat heart healthy diet (full of fruits, vegetables, whole grains, lean protein, water--limit salt, fat, and sugar intake) and increase physical activity as tolerated.  Continue doing brain stimulating activities (puzzles, reading, adult coloring books, staying active) to keep memory sharp.    Fernando Walker , Thank you for taking time to come for your Medicare Wellness Visit. I appreciate your ongoing commitment to your health goals. Please review the following plan we discussed and let me know if I can assist you in the future.   These are the goals we discussed: Goals    . Patient Stated     Take some time for me to do some things that I love to do. Increase the amount of water I drink daily. Stay as healthy and as independent as possible.       This is a list of the screening recommended for you and due dates:  Health Maintenance  Topic Date Due  . Tetanus Vaccine  02/10/1952  . Flu Shot  05/19/2019  . Pneumonia vaccines  Completed    Preventive Care 11 Years and Older, Male Preventive care refers to lifestyle choices and visits with your health care provider that can promote health and wellness. What does preventive care include?   A yearly physical exam. This is also called an annual well check.  Dental exams once or twice a year.  Routine  eye exams. Ask your health care provider how often you should have your eyes checked.  Personal lifestyle choices, including: ? Daily care of your teeth and gums. ? Regular physical activity. ? Eating a healthy diet. ? Avoiding tobacco and drug use. ? Limiting alcohol use. ? Practicing safe sex. ? Taking low doses of aspirin every day. ? Taking vitamin and mineral supplements as recommended by your health care provider. What happens during an annual well check? The services and screenings done by your health care provider during your annual well check will depend on your age, overall health, lifestyle risk factors, and family history of disease. Counseling Your health care provider may ask you questions about your:  Alcohol use.  Tobacco use.  Drug use.  Emotional well-being.  Home and relationship well-being.  Sexual activity.  Eating habits.  History of falls.  Memory and ability to understand (cognition).  Work and work Statistician. Screening You may have the following tests or measurements:  Height, weight, and BMI.  Blood pressure.  Lipid and cholesterol levels. These may be checked every 5 years, or more frequently if you are over 78 years old.  Skin check.  Lung cancer screening. You may have this screening every year starting at age 60 if you have a 30-pack-year history of smoking and currently smoke or have quit within the past 15 years.  Colorectal cancer screening. All adults should have this screening starting at age 9 and continuing until age 23. You will have tests every 1-10 years, depending on your  results and the type of screening test. People at increased risk should start screening at an earlier age. Screening tests may include: ? Guaiac-based fecal occult blood testing. ? Fecal immunochemical test (FIT). ? Stool DNA test. ? Virtual colonoscopy. ? Sigmoidoscopy. During this test, a flexible tube with a tiny camera (sigmoidoscope) is used to  examine your rectum and lower colon. The sigmoidoscope is inserted through your anus into your rectum and lower colon. ? Colonoscopy. During this test, a long, thin, flexible tube with a tiny camera (colonoscope) is used to examine your entire colon and rectum.  Prostate cancer screening. Recommendations will vary depending on your family history and other risks.  Hepatitis C blood test.  Hepatitis B blood test.  Sexually transmitted disease (STD) testing.  Diabetes screening. This is done by checking your blood sugar (glucose) after you have not eaten for a while (fasting). You may have this done every 1-3 years.  Abdominal aortic aneurysm (AAA) screening. You may need this if you are a current or former smoker.  Osteoporosis. You may be screened starting at age 48 if you are at high risk. Talk with your health care provider about your test results, treatment options, and if necessary, the need for more tests. Vaccines Your health care provider may recommend certain vaccines, such as:  Influenza vaccine. This is recommended every year.  Tetanus, diphtheria, and acellular pertussis (Tdap, Td) vaccine. You may need a Td booster every 10 years.  Varicella vaccine. You may need this if you have not been vaccinated.  Zoster vaccine. You may need this after age 22.  Measles, mumps, and rubella (MMR) vaccine. You may need at least one dose of MMR if you were born in 1957 or later. You may also need a second dose.  Pneumococcal 13-valent conjugate (PCV13) vaccine. One dose is recommended after age 51.  Pneumococcal polysaccharide (PPSV23) vaccine. One dose is recommended after age 89.  Meningococcal vaccine. You may need this if you have certain conditions.  Hepatitis A vaccine. You may need this if you have certain conditions or if you travel or work in places where you may be exposed to hepatitis A.  Hepatitis B vaccine. You may need this if you have certain conditions or if you  travel or work in places where you may be exposed to hepatitis B.  Haemophilus influenzae type b (Hib) vaccine. You may need this if you have certain risk factors. Talk to your health care provider about which screenings and vaccines you need and how often you need them. This information is not intended to replace advice given to you by your health care provider. Make sure you discuss any questions you have with your health care provider. Document Released: 10/31/2015 Document Revised: 11/24/2017 Document Reviewed: 08/05/2015 Elsevier Interactive Patient Education  2019 [employer].

## 2019-04-27 ENCOUNTER — Encounter: Payer: Self-pay | Admitting: Internal Medicine

## 2019-04-27 ENCOUNTER — Other Ambulatory Visit: Payer: Self-pay

## 2019-04-27 ENCOUNTER — Ambulatory Visit (INDEPENDENT_AMBULATORY_CARE_PROVIDER_SITE_OTHER): Payer: Medicare Other | Admitting: Internal Medicine

## 2019-04-27 VITALS — BP 142/78 | HR 72 | Temp 98.1°F | Resp 18 | Ht 75.0 in | Wt 211.8 lb

## 2019-04-27 DIAGNOSIS — S80861A Insect bite (nonvenomous), right lower leg, initial encounter: Secondary | ICD-10-CM

## 2019-04-27 DIAGNOSIS — W57XXXA Bitten or stung by nonvenomous insect and other nonvenomous arthropods, initial encounter: Secondary | ICD-10-CM | POA: Diagnosis not present

## 2019-04-27 DIAGNOSIS — S80862A Insect bite (nonvenomous), left lower leg, initial encounter: Secondary | ICD-10-CM | POA: Diagnosis not present

## 2019-04-27 DIAGNOSIS — I6523 Occlusion and stenosis of bilateral carotid arteries: Secondary | ICD-10-CM | POA: Diagnosis not present

## 2019-04-27 DIAGNOSIS — Z23 Encounter for immunization: Secondary | ICD-10-CM | POA: Diagnosis not present

## 2019-04-27 NOTE — Assessment & Plan Note (Signed)
Tick removed w/in 24 hrs so low risk of lyme disease  --redness appears to be local reaction only Discussed symptoms of lyme and other tick diseases to monitor for -he will call if he has any No treatment for tick bite at this time tdap

## 2019-04-27 NOTE — Patient Instructions (Signed)
You received a tetanus vaccine today.   Monitor the area of tick bite closely and let me know if the redness expands.    If you develop any concerning symptoms suggestive of lyme disease that we reviewed.  call immediately.

## 2019-04-27 NOTE — Progress Notes (Signed)
Subjective:    Patient ID: Fernando Walker, male    DOB: 03-06-1933, 85 y.o.   MRN: [id number]  HPI The patient is here for an acute visit.   tick bite with reaction:  He thinks he was bit by a tick yesterday.  He thinks it was on for less than 24 hrs.  He is almost positive it was a tick but it was small and hard to see.  The area is red and swollen.  It does not itch.  He denies pain.  He was worried if he needed antibiotics.  He denies fever or chills.  There is no open wound or discharge.      Medications and allergies reviewed with patient and updated if appropriate.  Patient Active Problem List   Diagnosis Date Noted  . Tick bite of right lower leg 04/27/2019  . Bug bite without infection 04/27/2019  . Diarrhea of infectious origin 09/20/2018  . Pneumonia   . Hypertension   . GERD (gastroesophageal reflux disease)   . Dyslipidemia   . Carotid arterial disease ([facility])   . CAD (coronary artery disease) of artery bypass graft   . Dupuytren's contracture of left hand 07/20/2017  . Skin abnormality 07/20/2017  . 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 Hx of adenomatous colonic polyps 07/27/2011  . Carotid bruit 06/02/2011  . FASTING HYPERGLYCEMIA 11/10/2009  . Essential hypertension 04/29/2009  . CAD, ARTERY BYPASS GRAFT 04/29/2009  . MYOCARDIAL PERFUSION SCAN, WITH STRESS TEST, ABNORMAL 04/29/2009  . Hyperlipidemia 04/24/2009  . HYPERTROPHY PROSTATE W/UR OBST & OTH LUTS 04/22/2009    Current Outpatient Medications on File Prior to Visit  Medication Sig Dispense Refill  . aspirin 81 MG tablet Take 81 mg by mouth daily.      . bimatoprost (LUMIGAN) 0.03 % ophthalmic solution Place 1 drop into both eyes at bedtime.     . Cyanocobalamin (VITAMIN B-12 PO) Take 1,000 mg by mouth daily.     . simvastatin (ZOCOR) 20 MG tablet TAKE 1 TABLET BY MOUTH AT  BEDTIME 90 tablet 1  . triamcinolone cream (KENALOG) 0.1 % Apply 1  application 2 (two) times daily topically.     No current facility-administered medications on file prior to visit.     Past Medical History:  Diagnosis Date  . CAD (coronary artery disease) of artery bypass graft   . Carotid arterial disease ([facility])    Nonobstructive  . Cataract   . Dyslipidemia   . GERD (gastroesophageal reflux disease)   . 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 Hyperlipidemia   . Hypertension   . Hypertrophy of prostate with urinary obstruction and other lower urinary tract symptoms (LUTS)   . Pneumonia    As a child  . Pneumonia    as child    Past Surgical History:  Procedure Laterality Date  . cataracts     both eyes  . COLONOSCOPY    . colonoscopy with polypectomy  2004  . CORONARY ARTERY BYPASS GRAFT  1995   X 7  . FASCIECTOMY Left 11/29/2017   Procedure: SEGMENTAL FASCIECTOMY LEFT SMALL FINGER;  Surgeon: Daryll Brod, MD;  Location: [facility];  Service: Orthopedics;  Laterality: Left;  block and MAC  . POLYPECTOMY      Social History   Socioeconomic History  . Marital status: Married    Spouse name: Not on file  . Number of children: 2  . Years of education:  Not on file  . Highest education level: Not on file  Occupational History  . Occupation: retired  Scientific laboratory technician  . Financial resource strain: Not hard at all  . Food insecurity    Worry: Never true    Inability: Never true  . Transportation needs    Medical: No    Non-medical: No  Tobacco Use  . Smoking status: Former Smoker    Quit date: 10/18/1984    Years since quitting: 34.5  . Smokeless tobacco: Former Network engineer and Sexual Activity  . Alcohol use: Yes    Alcohol/week: 7.0 standard drinks    Types: 7 Glasses of wine per week  . Drug use: No  . Sexual activity: Not Currently  Lifestyle  . Physical activity    Days per week: 4 days    Minutes per session: 50 min  . Stress: Rather much  Relationships  . Social connections    Talks on phone: More than three  times a week    Gets together: More than three times a week    Attends religious service: More than 4 times per year    Active member of club or organization: Yes    Attends meetings of clubs or organizations: More than 4 times per year    Relationship status: Married  Other Topics Concern  . Not on file  Social History Narrative  . Not on file    Family History  Problem Relation Age of Onset  . Stroke Father 3  . Diabetes Neg Hx   . Cancer Neg Hx   . GER disease Neg Hx   . Heart attack Neg Hx   . Colon cancer Neg Hx   . Esophageal cancer Neg Hx   . Stomach cancer Neg Hx   . Heart disease Neg Hx     Review of Systems  Constitutional: Negative for chills and fever.  Skin: Positive for color change and wound (insect bite).  Neurological: Negative for light-headedness and headaches.       Objective:   Vitals:   04/27/19 1311  BP: (!) 142/78  Pulse: 72  Resp: 18  Temp: 98.1 F (36.7 C)  SpO2: 98%   BP Readings from Last 3 Encounters:  04/27/19 (!) 142/78  12/13/18 140/80  09/20/18 124/72   Wt Readings from Last 3 Encounters:  04/27/19 211 lb 12.8 oz (96.1 kg)  12/13/18 216 lb 1.9 oz (98 kg)  09/20/18 213 lb (96.6 kg)   Body mass index is 26.47 kg/m.   Physical Exam Constitutional:      Appearance: Normal appearance. He is not toxic-appearing or diaphoretic.  Skin:    Comments: Quarter size area of erythema and swelling behind right knee - central bite w/o discharge/bleeding, non tender, no warmth  Neurological:     Mental Status: He is alert.            Assessment & Plan:    See Problem List for Assessment and Plan of chronic medical problems.

## 2019-04-27 NOTE — Assessment & Plan Note (Signed)
Insect bite - likely a tick with local reaction - no infection Will hold off on antibiotics at this time - he will monitor closely -- if expanding redness, pain or warmth develops may need an antibiotic tdap

## 2019-06-15 ENCOUNTER — Other Ambulatory Visit: Payer: Self-pay | Admitting: Cardiovascular Disease

## 2019-07-10 ENCOUNTER — Other Ambulatory Visit: Payer: Self-pay

## 2019-07-10 ENCOUNTER — Ambulatory Visit (INDEPENDENT_AMBULATORY_CARE_PROVIDER_SITE_OTHER): Payer: Medicare Other

## 2019-07-10 DIAGNOSIS — Z23 Encounter for immunization: Secondary | ICD-10-CM | POA: Diagnosis not present

## 2019-07-16 NOTE — Progress Notes (Addendum)
Cardiology Office Note    Date:  07/17/2019   ID:  Fernando Walker, DOB 1933/03/26, MRN [id number]  PCP:  Binnie Rail, MD  Cardiologist: Lauree Chandler, MD EPS: None  Chief Complaint  Patient presents with  . Chest Pain    History of Present Illness:  Fernando Walker is a 85 y.o. male with history of CAD S/P CABG x 7 1995, last cath 2017 patent grafts with  Total prox occlusion of all native vessels, myoview 06/2013 for dyspnea inf scar with peri-infact ischemia, intermediate risk study-no chest pain so never had cath. HTN, HLD, mild bilateral carotid disease 2017. Echo 2019 LVEF 65-70% mild AS mean gradient 13 mmgHg.  Last saw Dr. Angelena Form 11/2018 and doing well. No plans to repeat carotid dopplers with stability of disease.  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 Walking back from mailbox he became dizzy and developed chest tightness. Eased with sitting down. 3 days later he was walking and developed chest tightness. BP 162/140. Pulse 145. Everything normalized in 15 min. Has never had HTN.  Past Medical History:  Diagnosis Date  . CAD (coronary artery disease) of artery bypass graft   . Carotid arterial disease (HCC)    Nonobstructive  . Cataract   . Dyslipidemia   . GERD (gastroesophageal reflux disease)   . 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 Hyperlipidemia   . Hypertension   . Hypertrophy of prostate with urinary obstruction and other lower urinary tract symptoms (LUTS)   . Pneumonia    As a child  . Pneumonia    as child    Past Surgical History:  Procedure Laterality Date  . cataracts     both eyes  . COLONOSCOPY    . colonoscopy with polypectomy  2004  . CORONARY ARTERY BYPASS GRAFT  1995   X 7  . FASCIECTOMY Left 11/29/2017   Procedure: SEGMENTAL FASCIECTOMY LEFT SMALL FINGER;  Surgeon: Daryll Brod, MD;  Location: [facility];  Service: Orthopedics;  Laterality: Left;  block  and MAC  . POLYPECTOMY      Current Medications: Current Meds  Medication Sig  . aspirin 81 MG tablet Take 81 mg by mouth daily.    . bimatoprost (LUMIGAN) 0.03 % ophthalmic solution Place 1 drop into both eyes at bedtime.   . Cyanocobalamin (VITAMIN B-12 PO) Take 1,000 mg by mouth daily.   . simvastatin (ZOCOR) 20 MG tablet TAKE 1 TABLET BY MOUTH AT  BEDTIME  . triamcinolone cream (KENALOG) 0.1 % Apply 1 application 2 (two) times daily topically.     Allergies:   Sulfonamide derivatives   Social History   Socioeconomic History  . Marital status: Married    Spouse name: Not on file  . Number of children: 2  . Years of education: Not on file  . Highest education level: Not on file  Occupational History  . Occupation: retired  Scientific laboratory technician  . Financial resource strain: Not hard at all  . Food insecurity    Worry: Never true    Inability: Never true  . Transportation needs    Medical: No    Non-medical: No  Tobacco Use  . Smoking status: Former Smoker    Quit date: 10/18/1984    Years since quitting: 34.7  . Smokeless tobacco: Former Network engineer and Sexual Activity  . Alcohol use: Yes    Alcohol/week: 7.0 standard drinks    Types: 7  Glasses of wine per week  . Drug use: No  . Sexual activity: Not Currently  Lifestyle  . Physical activity    Days per week: 4 days    Minutes per session: 50 min  . Stress: Rather much  Relationships  . Social connections    Talks on phone: More than three times a week    Gets together: More than three times a week    Attends religious service: More than 4 times per year    Active member of club or organization: Yes    Attends meetings of clubs or organizations: More than 4 times per year    Relationship status: Married  Other Topics Concern  . Not on file  Social History Narrative  . Not on file     Family History:  The patient's   family history includes Stroke (age of onset: 85) in his father.   ROS:   Please see the  history of present illness.    ROS All other systems reviewed and are negative.   PHYSICAL EXAM:   VS:  BP (!) 142/90   Pulse 84   Ht 6' 3" (1.905 m)   Wt 205 lb 1.9 oz (93 kg)   SpO2 97%   BMI 25.64 kg/m   Physical Exam  GEN: Well nourished, well developed, in no acute distress  Neck: no JVD, carotid bruits, or masses Cardiac:RRR; 2-3/6 systolic murmur  Respiratory:  clear to auscultation bilaterally, normal work of breathing GI: soft, nontender, nondistended, + BS Ext: without cyanosis, clubbing, or edema, Good distal pulses bilaterally Neuro:  Alert and Oriented x 3 Psych: euthymic mood, full affect  Wt Readings from Last 3 Encounters:  07/17/19 205 lb 1.9 oz (93 kg)  04/27/19 211 lb 12.8 oz (96.1 kg)  12/13/18 216 lb 1.9 oz (98 kg)      Studies/Labs Reviewed:   EKG:  EKG is  ordered today.  The ekg ordered today demonstrates NSR with frequent PAC's  Recent Labs: 01/22/2019: ALT 19; BUN 13; Creatinine, Ser 1.25; Potassium 4.4; Sodium 143   Lipid Panel    Component Value Date/Time   CHOL 142 01/22/2019 0801   TRIG 56 01/22/2019 0801   HDL 64 01/22/2019 0801   CHOLHDL 2.2 01/22/2019 0801   CHOLHDL 2 08/27/2016 1007   VLDL 11.6 08/27/2016 1007   LDLCALC 67 01/22/2019 0801    Additional studies/ records that were reviewed today include:   Echo 10/25/17: - Left ventricle: The cavity size was normal. Systolic function was   vigorous. The estimated ejection fraction was in the range of 65%   to 70%. Wall motion was normal; there were no regional wall   motion abnormalities. There was an increased relative   contribution of atrial contraction to ventricular filling.   Doppler parameters are consistent with abnormal left ventricular   relaxation (grade 1 diastolic dysfunction). - Aortic valve: Severe diffuse calcification involving the left   coronary and noncoronary cusp. Valve mobility was restricted.   There was mild stenosis. Mean gradient (S): 13 mm Hg. -  Mitral valve: Calcified annulus. There was trivial regurgitation. - Atrial septum: There was increased thickness of the septum,   consistent with lipomatous hypertrophy.   Impressions:   - Normal LVH, mild AS and grade 1 DD. Compared to study of 2016   there is no significant change in mean AV gradient (21mmHg in   2016 and 13mmHg this study).  Cardiac cath 05/01/09:  The left main coronary   artery was free of significant disease.  The left anterior descending artery was completely occluded after 2  septal perforators in its proximal portion.  The circumflex artery was completely occluded in its midportion. There  is a 90% proximal stenosis. The distal vessel filled faintly via  bridging collaterals.  The right coronary artery was completely occluded near its origin.  The saphenous vein graft to the acute marginal and posterior ascending  branch of the right coronary artery were patent. There was a filling  defect in the mid-to-distal portion of the graft, which did not appear  to be obstructive. There was also a 30-40% narrowing in midportion of  the graft just after the attachment to the acute marginal branch.  The LIMA graft to LAD was patent and functioned normally.  The sequential graft to the marginal and posterolateral branch of  circumflex artery was patent and functioned normally. There were  minimal luminal irregularities in the graft.  The vein graft to the first and second diagonal branch of the LAD was  patent and functioned normally.  The left ventriculogram performed in the RAO projection showed good wall  motion with no areas of hypokinesis. The estimated fraction was 65%.  HEMODYNAMIC DATA: The aortic pressure was 100/59 with a mean of 77.  The left ventricular pressure was 100/10.   Carotid dopplers 09/07/2016 Heterogeneous plaque, bilaterally. Stable 1-39% bilateral ICA stenosis. Normal subclavian arteries, bilaterally. Patent vertebral arteries with antegrade  flow.  ASSESSMENT:    1. Coronary artery disease involving coronary bypass graft of native heart without angina pectoris   2. Chest pain, unspecified type   3. Essential hypertension   4. Hyperlipidemia, unspecified hyperlipidemia type   5. Bilateral carotid artery disease, unspecified type ([place])   6. SVT (supraventricular tachycardia) ([facility])   7. Aortic valve stenosis, etiology of cardiac valve disease unspecified      PLAN:  In order of problems listed above:  CAD S/P CABG x 7 1995, last cath 2017 patent grafts with Total prox occlusion of all native vessels, myoview 06/2013 for dyspnea inf scar with peri-infact ischemia, intermediate risk study-no chest pain so never had cath.Now with 2 episodes of exertional chest tightness and tachycardia. BP up as well-will start lopressor 25 mg BID, NTG .  Addendum: discussed with Dr. Angelena Form who recommends we proceed with left heart catheterization and grafts. Called patient and he is agreeable. bmet and cbc drawn today.will schedule. I have reviewed the risks, indications, and alternatives to angioplasty and stenting with the patient. Risks include but are not limited to bleeding, infection, vascular injury, stroke, myocardial infection, arrhythmia, kidney injury, radiation-related injury in the case of prolonged fluoroscopy use, emergency cardiac surgery, and death. The patient understands the risks of serious complication is low (<3%) and patient agrees to proceed.     Essential HTN-begin lopressor  HLD LDL 67 in April  Mild aortic stenosis 10/2017-recheck echo to make sure it hasn't worsened and could be contributing to his symptoms  Tachycardia and PAC's 2 week monitor  Bilateral carotid stenosis 1-39% 2017-no plans for repeat per Dr. Angelena Form    Medication Adjustments/Labs and Tests Ordered: Current medicines are reviewed at length with the patient today.  Concerns regarding medicines are outlined above.  Medication changes, Labs and  Tests ordered today are listed in the Patient Instructions below. Patient Instructions  Medication Instructions:  Your physician has recommended you make the following change in your medication:   1. START: metoprolol tartrate (lopressor) 25 mg tablet: Take  1 tablet by mouth twice a day  2. USE: Sublingual Nitroglycerin 0.4 mg tablet as directed AS NEEDED for chest pain  Lab work: TODAY: CBC. BMET  If you have labs (blood work) drawn today and your tests are completely normal, you will receive your results only by: Marland Kitchen MyChart Message (if you have MyChart) OR . A paper copy in the mail If you have any lab test that is abnormal or we need to change your treatment, we will call you to review the results.  Testing/Procedures: Your physician has requested that you have an echocardiogram. Echocardiography is a painless test that uses sound waves to create images of your heart. It provides your doctor with information about the size and shape of your heart and how well your heart's chambers and valves are working. This procedure takes approximately one hour. There are no restrictions for this procedure.  Your physician has recommended that you wear a 14 day monitor. These monitors are medical devices that record the heart's electrical activity. Doctors most often use these monitors to diagnose arrhythmias. Arrhythmias are problems with the speed or rhythm of the heartbeat. The monitor is a small, portable device. You can wear one while you do your normal daily activities. This is usually used to diagnose what is causing palpitations/syncope (passing out).  Your physician has requested that you have a lexiscan myoview. For further information please visit HugeFiesta.[email or web address]. Please follow instruction sheet, as given.  Follow-Up: . Follow up with Ermalinda Barrios, PA on via TELEPHONE VISIT on 07/31/19 at 12:30 PM.  Any Other Special Instructions Will Be Listed Below (If Applicable).        Signed, Ermalinda Barrios, PA-C  07/17/2019 4:25 PM    [facility] [place], [place], [place]  [address] Phone: [phone]; Fax: [phone]

## 2019-07-16 NOTE — H&P (View-Only) (Signed)
Cardiology Office Note    Date:  07/17/2019   ID:  Fernando Walker, DOB 1933/03/26, MRN [id number]  PCP:  Binnie Rail, MD  Cardiologist: Lauree Chandler, MD EPS: None  Chief Complaint  Patient presents with  . Chest Pain    History of Present Illness:  Fernando Walker is a 85 y.o. male with history of CAD S/P CABG x 7 1995, last cath 2017 patent grafts with  Total prox occlusion of all native vessels, myoview 06/2013 for dyspnea inf scar with peri-infact ischemia, intermediate risk study-no chest pain so never had cath. HTN, HLD, mild bilateral carotid disease 2017. Echo 2019 LVEF 65-70% mild AS mean gradient 13 mmgHg.  Last saw Dr. Angelena Form 11/2018 and doing well. No plans to repeat carotid dopplers with stability of disease.  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 Walking back from mailbox he became dizzy and developed chest tightness. Eased with sitting down. 3 days later he was walking and developed chest tightness. BP 162/140. Pulse 145. Everything normalized in 15 min. Has never had HTN.  Past Medical History:  Diagnosis Date  . CAD (coronary artery disease) of artery bypass graft   . Carotid arterial disease (HCC)    Nonobstructive  . Cataract   . Dyslipidemia   . GERD (gastroesophageal reflux disease)   . 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 Hyperlipidemia   . Hypertension   . Hypertrophy of prostate with urinary obstruction and other lower urinary tract symptoms (LUTS)   . Pneumonia    As a child  . Pneumonia    as child    Past Surgical History:  Procedure Laterality Date  . cataracts     both eyes  . COLONOSCOPY    . colonoscopy with polypectomy  2004  . CORONARY ARTERY BYPASS GRAFT  1995   X 7  . FASCIECTOMY Left 11/29/2017   Procedure: SEGMENTAL FASCIECTOMY LEFT SMALL FINGER;  Surgeon: Daryll Brod, MD;  Location: [facility];  Service: Orthopedics;  Laterality: Left;  block  and MAC  . POLYPECTOMY      Current Medications: Current Meds  Medication Sig  . aspirin 81 MG tablet Take 81 mg by mouth daily.    . bimatoprost (LUMIGAN) 0.03 % ophthalmic solution Place 1 drop into both eyes at bedtime.   . Cyanocobalamin (VITAMIN B-12 PO) Take 1,000 mg by mouth daily.   . simvastatin (ZOCOR) 20 MG tablet TAKE 1 TABLET BY MOUTH AT  BEDTIME  . triamcinolone cream (KENALOG) 0.1 % Apply 1 application 2 (two) times daily topically.     Allergies:   Sulfonamide derivatives   Social History   Socioeconomic History  . Marital status: Married    Spouse name: Not on file  . Number of children: 2  . Years of education: Not on file  . Highest education level: Not on file  Occupational History  . Occupation: retired  Scientific laboratory technician  . Financial resource strain: Not hard at all  . Food insecurity    Worry: Never true    Inability: Never true  . Transportation needs    Medical: No    Non-medical: No  Tobacco Use  . Smoking status: Former Smoker    Quit date: 10/18/1984    Years since quitting: 34.7  . Smokeless tobacco: Former Network engineer and Sexual Activity  . Alcohol use: Yes    Alcohol/week: 7.0 standard drinks    Types: 7  Glasses of wine per week  . Drug use: No  . Sexual activity: Not Currently  Lifestyle  . Physical activity    Days per week: 4 days    Minutes per session: 50 min  . Stress: Rather much  Relationships  . Social connections    Talks on phone: More than three times a week    Gets together: More than three times a week    Attends religious service: More than 4 times per year    Active member of club or organization: Yes    Attends meetings of clubs or organizations: More than 4 times per year    Relationship status: Married  Other Topics Concern  . Not on file  Social History Narrative  . Not on file     Family History:  The patient's   family history includes Stroke (age of onset: 51) in his father.   ROS:   Please see the  history of present illness.    ROS All other systems reviewed and are negative.   PHYSICAL EXAM:   VS:  BP (!) 142/90   Pulse 84   Ht _0  (1.905 m)   Wt 205 lb 1.9 oz (93 kg)   SpO2 97%   BMI 25.64 kg/m   Physical Exam  GEN: Well nourished, well developed, in no acute distress  Neck: no JVD, carotid bruits, or masses Cardiac:RRR; 9-4/7 systolic murmur  Respiratory:  clear to auscultation bilaterally, normal work of breathing GI: soft, nontender, nondistended, + BS Ext: without cyanosis, clubbing, or edema, Good distal pulses bilaterally Neuro:  Alert and Oriented x 3 Psych: euthymic mood, full affect  Wt Readings from Last 3 Encounters:  07/17/19 205 lb 1.9 oz (93 kg)  04/27/19 211 lb 12.8 oz (96.1 kg)  12/13/18 216 lb 1.9 oz (98 kg)      Studies/Labs Reviewed:   EKG:  EKG is  ordered today.  The ekg ordered today demonstrates NSR with frequent PAC's  Recent Labs: 01/22/2019: ALT 19; BUN 13; Creatinine, Ser 1.25; Potassium 4.4; Sodium 143   Lipid Panel    Component Value Date/Time   CHOL 142 01/22/2019 0801   TRIG 56 01/22/2019 0801   HDL 64 01/22/2019 0801   CHOLHDL 2.2 01/22/2019 0801   CHOLHDL 2 08/27/2016 1007   VLDL 11.6 08/27/2016 1007   LDLCALC 67 01/22/2019 0801    Additional studies/ records that were reviewed today include:   Echo 10/25/17: - Left ventricle: The cavity size was normal. Systolic function was   vigorous. The estimated ejection fraction was in the range of 65%   to 70%. Wall motion was normal; there were no regional wall   motion abnormalities. There was an increased relative   contribution of atrial contraction to ventricular filling.   Doppler parameters are consistent with abnormal left ventricular   relaxation (grade 1 diastolic dysfunction). - Aortic valve: Severe diffuse calcification involving the left   coronary and noncoronary cusp. Valve mobility was restricted.   There was mild stenosis. Mean gradient (S): 13 mm Hg. -  Mitral valve: Calcified annulus. There was trivial regurgitation. - Atrial septum: There was increased thickness of the septum,   consistent with lipomatous hypertrophy.   Impressions:   - Normal LVH, mild AS and grade 1 DD. Compared to study of 2016   there is no significant change in mean AV gradient (15mHg in   2016 and 19mg this study).  Cardiac cath 05/01/09:  The left main coronary  artery was free of significant disease.  The left anterior descending artery was completely occluded after 2  septal perforators in its proximal portion.  The circumflex artery was completely occluded in its midportion. There  is a 90% proximal stenosis. The distal vessel filled faintly via  bridging collaterals.  The right coronary artery was completely occluded near its origin.  The saphenous vein graft to the acute marginal and posterior ascending  branch of the right coronary artery were patent. There was a filling  defect in the mid-to-distal portion of the graft, which did not appear  to be obstructive. There was also a 30-40% narrowing in midportion of  the graft just after the attachment to the acute marginal branch.  The LIMA graft to LAD was patent and functioned normally.  The sequential graft to the marginal and posterolateral branch of  circumflex artery was patent and functioned normally. There were  minimal luminal irregularities in the graft.  The vein graft to the first and second diagonal branch of the LAD was  patent and functioned normally.  The left ventriculogram performed in the RAO projection showed good wall  motion with no areas of hypokinesis. The estimated fraction was 65%.  HEMODYNAMIC DATA: The aortic pressure was 100/59 with a mean of 77.  The left ventricular pressure was 100/10.   Carotid dopplers 09/07/2016 Heterogeneous plaque, bilaterally. Stable 1-39% bilateral ICA stenosis. Normal subclavian arteries, bilaterally. Patent vertebral arteries with antegrade  flow.  ASSESSMENT:    1. Coronary artery disease involving coronary bypass graft of native heart without angina pectoris   2. Chest pain, unspecified type   3. Essential hypertension   4. Hyperlipidemia, unspecified hyperlipidemia type   5. Bilateral carotid artery disease, unspecified type ([place])   6. SVT (supraventricular tachycardia) ([place])   7. Aortic valve stenosis, etiology of cardiac valve disease unspecified      PLAN:  In order of problems listed above:  CAD S/P CABG x 7 1995, last cath 2017 patent grafts with Total prox occlusion of all native vessels, myoview 06/2013 for dyspnea inf scar with peri-infact ischemia, intermediate risk study-no chest pain so never had cath.Now with 2 episodes of exertional chest tightness and tachycardia. BP up as well-will start lopressor 25 mg BID, NTG .  Addendum: discussed with Dr. Angelena Form who recommends we proceed with left heart catheterization and grafts. Called patient and he is agreeable. bmet and cbc drawn today.will schedule. I have reviewed the risks, indications, and alternatives to angioplasty and stenting with the patient. Risks include but are not limited to bleeding, infection, vascular injury, stroke, myocardial infection, arrhythmia, kidney injury, radiation-related injury in the case of prolonged fluoroscopy use, emergency cardiac surgery, and death. The patient understands the risks of serious complication is low (<3%) and patient agrees to proceed.     Essential HTN-begin lopressor  HLD LDL 67 in April  Mild aortic stenosis 10/2017-recheck echo to make sure it hasn't worsened and could be contributing to his symptoms  Tachycardia and PAC's 2 week monitor  Bilateral carotid stenosis 1-39% 2017-no plans for repeat per Dr. Angelena Form    Medication Adjustments/Labs and Tests Ordered: Current medicines are reviewed at length with the patient today.  Concerns regarding medicines are outlined above.  Medication changes, Labs and  Tests ordered today are listed in the Patient Instructions below. Patient Instructions  Medication Instructions:  Your physician has recommended you make the following change in your medication:   1. START: metoprolol tartrate (lopressor) 25 mg tablet: Take  1 tablet by mouth twice a day  2. USE: Sublingual Nitroglycerin 0.4 mg tablet as directed AS NEEDED for chest pain  Lab work: TODAY: CBC. BMET  If you have labs (blood work) drawn today and your tests are completely normal, you will receive your results only by: Marland Kitchen MyChart Message (if you have MyChart) OR . A paper copy in the mail If you have any lab test that is abnormal or we need to change your treatment, we will call you to review the results.  Testing/Procedures: Your physician has requested that you have an echocardiogram. Echocardiography is a painless test that uses sound waves to create images of your heart. It provides your doctor with information about the size and shape of your heart and how well your heart's chambers and valves are working. This procedure takes approximately one hour. There are no restrictions for this procedure.  Your physician has recommended that you wear a 14 day monitor. These monitors are medical devices that record the heart's electrical activity. Doctors most often use these monitors to diagnose arrhythmias. Arrhythmias are problems with the speed or rhythm of the heartbeat. The monitor is a small, portable device. You can wear one while you do your normal daily activities. This is usually used to diagnose what is causing palpitations/syncope (passing out).  Your physician has requested that you have a lexiscan myoview. For further information please visit HugeFiesta.[email or web address]. Please follow instruction sheet, as given.  Follow-Up: . Follow up with Ermalinda Barrios, PA on via TELEPHONE VISIT on 07/31/19 at 12:30 PM.  Any Other Special Instructions Will Be Listed Below (If Applicable).        Signed, Ermalinda Barrios, PA-C  07/17/2019 4:25 PM    [facility] [place], [place], [place]  [address] Phone: [phone]; Fax: [phone]

## 2019-07-17 ENCOUNTER — Ambulatory Visit (INDEPENDENT_AMBULATORY_CARE_PROVIDER_SITE_OTHER): Payer: Medicare Other | Admitting: Physician Assistant

## 2019-07-17 ENCOUNTER — Encounter: Payer: Self-pay | Admitting: Physician Assistant

## 2019-07-17 ENCOUNTER — Telehealth: Payer: Self-pay | Admitting: *Deleted

## 2019-07-17 ENCOUNTER — Other Ambulatory Visit: Payer: Self-pay

## 2019-07-17 VITALS — BP 142/90 | HR 84 | Ht 75.0 in | Wt 205.1 lb

## 2019-07-17 DIAGNOSIS — I739 Peripheral vascular disease, unspecified: Secondary | ICD-10-CM | POA: Diagnosis not present

## 2019-07-17 DIAGNOSIS — I471 Supraventricular tachycardia: Secondary | ICD-10-CM

## 2019-07-17 DIAGNOSIS — I779 Disorder of arteries and arterioles, unspecified: Secondary | ICD-10-CM

## 2019-07-17 DIAGNOSIS — I6523 Occlusion and stenosis of bilateral carotid arteries: Secondary | ICD-10-CM

## 2019-07-17 DIAGNOSIS — I10 Essential (primary) hypertension: Secondary | ICD-10-CM | POA: Diagnosis not present

## 2019-07-17 DIAGNOSIS — R079 Chest pain, unspecified: Secondary | ICD-10-CM

## 2019-07-17 DIAGNOSIS — I25810 Atherosclerosis of coronary artery bypass graft(s) without angina pectoris: Secondary | ICD-10-CM

## 2019-07-17 DIAGNOSIS — E785 Hyperlipidemia, unspecified: Secondary | ICD-10-CM | POA: Diagnosis not present

## 2019-07-17 DIAGNOSIS — I350 Nonrheumatic aortic (valve) stenosis: Secondary | ICD-10-CM

## 2019-07-17 LAB — BASIC METABOLIC PANEL
BUN/Creatinine Ratio: 14 (ref 10–24)
BUN: 17 mg/dL (ref 8–27)
CO2: 25 mmol/L (ref 20–29)
Calcium: 9.4 mg/dL (ref 8.6–10.2)
Chloride: 103 mmol/L (ref 96–106)
Creatinine, Ser: 1.22 mg/dL (ref 0.76–1.27)
GFR calc Af Amer: 63 mL/min/{1.73_m2} (ref >59)
GFR calc non Af Amer: 54 mL/min/{1.73_m2} — ABNORMAL LOW (ref >59)
Glucose: 114 mg/dL — ABNORMAL HIGH (ref 65–99)
Potassium: 4.4 mmol/L (ref 3.5–5.2)
Sodium: 141 mmol/L (ref 134–144)

## 2019-07-17 LAB — CBC
Hematocrit: 42.7 % (ref 37.5–51.0)
Hemoglobin: 14.1 g/dL (ref 13.0–17.7)
MCH: 30.3 pg (ref 26.6–33.0)
MCHC: 33 g/dL (ref 31.5–35.7)
MCV: 92 fL (ref 79–97)
Platelets: 280 10*3/uL (ref 150–450)
RBC: 4.66 x10E6/uL (ref 4.14–5.80)
RDW: 12.7 % (ref 11.6–15.4)
WBC: 8.8 10*3/uL (ref 3.4–10.8)

## 2019-07-17 MED ORDER — NITROGLYCERIN 0.4 MG SL SUBL: 0.4000 mg | SUBLINGUAL_TABLET | SUBLINGUAL | 0 refills | Status: DC | PRN

## 2019-07-17 MED ORDER — METOPROLOL TARTRATE 25 MG PO TABS: 25.0000 mg | ORAL_TABLET | Freq: Two times a day (BID) | ORAL | 0 refills | Status: DC

## 2019-07-17 NOTE — Telephone Encounter (Signed)
14 day ZIO XT long term holter monitor to be mailed to the patients home.  Patient instructed not to apply monitor until after his Echocardiogram on 07/24/2019.  The ZIO patch monitor is a single patch monitor which cannot be removed and reapplied for another test.  Instructions reviewed briefly as they are included in the monitor kit. The patient had thought all testing had been cancelled because a cardiac cath was to be performed instead.  Let patient know that we had not been informed his testing was cancelled and let him know I would forward this message to Estella Husk, PA-C to confirm. If monitor is delivered and order has been cancelled, patient instructed to return monitor to [facility] in prepaid packaging included in monitor kit.  He will not be charged for the monitor unless it was applied and turned on.

## 2019-07-17 NOTE — Addendum Note (Signed)
Addended by: Imogene Burn on: 07/17/2019 04:48 PM   Modules accepted: Orders, SmartSet

## 2019-07-17 NOTE — Patient Instructions (Addendum)
Medication Instructions:  Your physician has recommended you make the following change in your medication:   1. START: metoprolol tartrate (lopressor) 25 mg tablet: Take 1 tablet by mouth twice a day  2. USE: Sublingual Nitroglycerin 0.4 mg tablet as directed AS NEEDED for chest pain  Lab work: TODAY: CBC. BMET  If you have labs (blood work) drawn today and your tests are completely normal, you will receive your results only by: Marland Kitchen MyChart Message (if you have MyChart) OR . A paper copy in the mail If you have any lab test that is abnormal or we need to change your treatment, we will call you to review the results.  Testing/Procedures: Your physician has requested that you have an echocardiogram. Echocardiography is a painless test that uses sound waves to create images of your heart. It provides your doctor with information about the size and shape of your heart and how well your heart's chambers and valves are working. This procedure takes approximately one hour. There are no restrictions for this procedure.  Your physician has recommended that you wear a 14 day monitor. These monitors are medical devices that record the heart's electrical activity. Doctors most often use these monitors to diagnose arrhythmias. Arrhythmias are problems with the speed or rhythm of the heartbeat. The monitor is a small, portable device. You can wear one while you do your normal daily activities. This is usually used to diagnose what is causing palpitations/syncope (passing out).  Your physician has requested that you have a lexiscan myoview. For further information please visit [email or web address]. Please follow instruction sheet, as given.  Follow-Up: . Follow up with Ermalinda Barrios, PA on via TELEPHONE VISIT on 07/31/19 at 12:30 PM.  Any Other Special Instructions Will Be Listed Below (If Applicable).

## 2019-07-18 ENCOUNTER — Telehealth: Payer: Self-pay

## 2019-07-18 NOTE — Progress Notes (Signed)
Called and spoke to patient and scheduled LHC on 10/7 at 11:30 AM. Rescheduled echo to be done on 10/2 at 1:45 PM. Cancelled lexiscan. Scheduled Covid test for 10/3 at 11:55 AM. Changed virtual f/u appt on 10/13 to in office appt on 10/14 with Ermalinda Barrios, PA. Patient will continue with the plan to have a monitor placed after his echo on 10/2. Reviewed pre-cath instructions with the patient. Patient verbalized understanding to all instruction. Cath letter left up front for patient to pick up when he comes in for his echo on 10/2.

## 2019-07-18 NOTE — Telephone Encounter (Signed)
Called and spoke to patient and scheduled LHC on 10/7 at 11:30 AM. Rescheduled echo to be done on 10/2 at 1:45 PM. Cancelled lexiscan. Scheduled Covid test for 10/3 at 11:55 AM. Changed virtual f/u appt on 10/13 to in office appt on 10/14 with Ermalinda Barrios, PA. Patient will continue with the plan to have a monitor placed after his echo on 10/2. Reviewed pre-cath instructions below. Patient verbalized understanding top all instruction. Cath letter left up front for patient to pick up when he comes in for his echo on 10/2.    [place] [facility] [place] OFFICE [place], [place] [address] Dept: [phone] Loc: [place]  07/18/2019  You are scheduled for a Cardiac Catheterization on Wednesday, October 7 with Dr. Lauree Chandler.  1. Please arrive at the [facility] (Main Entrance A) at [facility]: [address] [place], [place] [address] at 9:30 AM (This time is two hours before your procedure to ensure your preparation). Free valet parking service is available.   Special note: Every effort is made to have your procedure done on time. Please understand that emergencies sometimes delay scheduled procedures.  2. Diet: Do not eat solid foods after midnight.  The patient may have clear liquids until 5am upon the day of the procedure.  3. Labs: Labs done on 07/17/19  Your physician has requested that you have an echocardiogram on 07/20/19 at 1:45 PM. Please arrive at 1:30 PM.  Your Pre-procedure COVID-19 Testing will be done on 07/21/19 at 11:55 AM at [facility] at [id number], [place], [place] [address]. Once you arrive at the testing site, stay in the right hand lane, go under the building overhang not the tent. If you are tested under the tent your results may not be back before your procedure. Please be on time for your appointment.  After your  swab you will be given a mask to wear and instructed to go home and quarantine/no visitors until after your procedure. If you test positive you will be notified and your procedure will be cancelled.    4. Medication instructions in preparation for your procedure:   Contrast Allergy: No   On the morning of your procedure, take your Aspirin and any regular morning medicines. You may use sips of water.  5. Plan for one night stay--bring personal belongings. 6. Bring a current list of your medications and current insurance cards. 7. You MUST have a responsible person to drive you home. 8. Someone MUST be with you the first 24 hours after you arrive home or your discharge will be delayed. 9. Please wear clothes that are easy to get on and off and wear slip-on shoes.  Your follow up appointment will be in the office on 08/01/19 at 12:00 PM with Ermalinda Barrios, PA  Thank you for allowing [place] to care for you!   -- [facility]

## 2019-07-20 ENCOUNTER — Other Ambulatory Visit: Payer: Self-pay

## 2019-07-20 ENCOUNTER — Ambulatory Visit (HOSPITAL_COMMUNITY): Payer: Medicare Other | Attending: Internal Medicine

## 2019-07-20 DIAGNOSIS — I350 Nonrheumatic aortic (valve) stenosis: Secondary | ICD-10-CM | POA: Insufficient documentation

## 2019-07-21 ENCOUNTER — Other Ambulatory Visit (HOSPITAL_COMMUNITY)
Admission: RE | Admit: 2019-07-21 | Discharge: 2019-07-21 | Disposition: A | Discharge status: Home / Self Care | Payer: Medicare Other | Source: Ambulatory Visit | Attending: Cardiovascular Disease | Admitting: Cardiovascular Disease

## 2019-07-21 DIAGNOSIS — Z20828 Contact with and (suspected) exposure to other viral communicable diseases: Secondary | ICD-10-CM | POA: Diagnosis not present

## 2019-07-21 DIAGNOSIS — Z01812 Encounter for preprocedural laboratory examination: Secondary | ICD-10-CM | POA: Insufficient documentation

## 2019-07-22 LAB — NOVEL CORONAVIRUS, NAA (HOSP ORDER, SEND-OUT TO REF LAB; TAT 18-24 HRS): SARS-CoV-2, NAA: NOT DETECTED

## 2019-07-23 ENCOUNTER — Telehealth: Payer: Self-pay | Admitting: Physician Assistant

## 2019-07-23 MED ORDER — METOPROLOL TARTRATE 25 MG PO TABS: 12.5000 mg | ORAL_TABLET | Freq: Two times a day (BID) | ORAL | 3 refills | Status: DC

## 2019-07-23 NOTE — Telephone Encounter (Signed)
° ° °  Pt c/o medication issue:  1. Name of Medication: metoprolol tartrate (LOPRESSOR) 25 MG tablet  2. How are you currently taking this medication (dosage and times per day)? Stopped taking   3. Are you having a reaction (difficulty breathing--STAT)? no  4. What is your medication issue? Weak, hands tremble, dizziness after taking medication

## 2019-07-23 NOTE — Telephone Encounter (Signed)
Please ask him to try taking metoprolol 25 mg 1/2 twice daily. His BP was up.

## 2019-07-23 NOTE — Telephone Encounter (Signed)
lpmtcb 10/5 

## 2019-07-23 NOTE — Telephone Encounter (Signed)
I spoke to the patient who started Lopressor 25 mg bid on 07/17/19.  He has noticed symptoms of "swimmy head, dizzy, weak and hands trembling."  His BP was 128/58 and HR 52, usually in the 70s, at these times.    He took his last dose on Sunday evening and none on Monday.  I told him to hold until further advisement.  He is scheduled for heart cath on 10/7.

## 2019-07-23 NOTE — Telephone Encounter (Signed)
New Message ° ° °Patient returning your phone call. °

## 2019-07-23 NOTE — Telephone Encounter (Signed)
I spoke to the patient with Michele's recommendation.   He verbalized understanding.

## 2019-07-24 ENCOUNTER — Other Ambulatory Visit (HOSPITAL_COMMUNITY): Payer: Medicare Other

## 2019-07-24 ENCOUNTER — Encounter (HOSPITAL_COMMUNITY): Payer: Medicare Other

## 2019-07-24 ENCOUNTER — Telehealth: Payer: Self-pay | Admitting: Cardiovascular Disease

## 2019-07-24 ENCOUNTER — Telehealth: Payer: Self-pay | Admitting: *Deleted

## 2019-07-24 NOTE — Telephone Encounter (Signed)
New message:    Patient calling stating that he has been waiting on some to call him cornering a appt tomorrow.

## 2019-07-24 NOTE — Telephone Encounter (Signed)
Called and spoke to the patient. Reviewed cath instructions with the patient again. Patient states that he forgot to pick up his letter when he was here for his echo. Instructions sent through [place]. Reviewed echo results with patient. Patient verbalized understanding and denied having any other questions or concerns at this time.

## 2019-07-24 NOTE — Telephone Encounter (Signed)
Pt contacted pre-catheterization scheduled at [facility] for: Wednesday July 25, 2019 11:30 AM Verified arrival time and place: [facility]) at: 9:30 AM   No solid food after midnight prior to cath, clear liquids until 5 AM day of procedure. Contrast allergy: no  AM meds can be  taken pre-cath with sip of water including: ASA 81 mg   Confirmed patient has responsible adult to drive home post procedure and observe 24 hours after arriving home: yes  Currently, due to Covid-19 pandemic, only one support person will be allowed with patient. Must be the same support person for that patient's entire stay, will be screened and required to wear a mask. They will be asked to wait in the waiting room for the duration of the patient's stay.  Patients are required to wear a mask when they enter the hospital.      COVID-19 Pre-Screening Questions:  . In the past 7 to 10 days have you had a cough,  shortness of breath, headache, congestion, fever (100 or greater) body aches, chills, sore throat, or sudden loss of taste or sense of smell? no . Have you been around anyone with known Covid 19? no . Have you been around anyone who is awaiting Covid 19 test results in the past 7 to 10 days? no . Have you been around anyone who has been exposed to Covid 19, or has mentioned symptoms of Covid 19 within the past 7 to 10 days? no   I reviewed procedure/mask/visitor instructions, Covid-19 screening questions with patient, he verbalized understanding.

## 2019-07-25 ENCOUNTER — Other Ambulatory Visit: Payer: Self-pay

## 2019-07-25 ENCOUNTER — Encounter (HOSPITAL_COMMUNITY)
Admission: RE | Disposition: A | Discharge status: Home / Self Care | Payer: Self-pay | Source: Home / Self Care | Attending: Cardiovascular Disease

## 2019-07-25 ENCOUNTER — Ambulatory Visit (HOSPITAL_COMMUNITY)
Admission: RE | Admit: 2019-07-25 | Discharge: 2019-07-25 | Disposition: A | Discharge status: Home / Self Care | Payer: Medicare Other | Attending: Cardiovascular Disease | Admitting: Cardiovascular Disease

## 2019-07-25 DIAGNOSIS — Z87891 Personal history of nicotine dependence: Secondary | ICD-10-CM | POA: Diagnosis not present

## 2019-07-25 DIAGNOSIS — I2582 Chronic total occlusion of coronary artery: Secondary | ICD-10-CM | POA: Diagnosis not present

## 2019-07-25 DIAGNOSIS — I25118 Atherosclerotic heart disease of native coronary artery with other forms of angina pectoris: Secondary | ICD-10-CM

## 2019-07-25 DIAGNOSIS — Z79899 Other long term (current) drug therapy: Secondary | ICD-10-CM | POA: Diagnosis not present

## 2019-07-25 DIAGNOSIS — I6523 Occlusion and stenosis of bilateral carotid arteries: Secondary | ICD-10-CM | POA: Insufficient documentation

## 2019-07-25 DIAGNOSIS — Z7982 Long term (current) use of aspirin: Secondary | ICD-10-CM | POA: Diagnosis not present

## 2019-07-25 DIAGNOSIS — Z823 Family history of stroke: Secondary | ICD-10-CM | POA: Diagnosis not present

## 2019-07-25 DIAGNOSIS — I10 Essential (primary) hypertension: Secondary | ICD-10-CM | POA: Insufficient documentation

## 2019-07-25 DIAGNOSIS — I25810 Atherosclerosis of coronary artery bypass graft(s) without angina pectoris: Secondary | ICD-10-CM

## 2019-07-25 DIAGNOSIS — I25718 Atherosclerosis of autologous vein coronary artery bypass graft(s) with other forms of angina pectoris: Secondary | ICD-10-CM

## 2019-07-25 DIAGNOSIS — Z882 Allergy status to sulfonamides status: Secondary | ICD-10-CM | POA: Insufficient documentation

## 2019-07-25 DIAGNOSIS — R079 Chest pain, unspecified: Secondary | ICD-10-CM

## 2019-07-25 DIAGNOSIS — I350 Nonrheumatic aortic (valve) stenosis: Secondary | ICD-10-CM | POA: Diagnosis not present

## 2019-07-25 DIAGNOSIS — E785 Hyperlipidemia, unspecified: Secondary | ICD-10-CM | POA: Insufficient documentation

## 2019-07-25 DIAGNOSIS — I471 Supraventricular tachycardia: Secondary | ICD-10-CM | POA: Diagnosis not present

## 2019-07-25 HISTORY — PX: LEFT HEART CATH AND CORS/GRAFTS ANGIOGRAPHY: CATH118250

## 2019-07-25 SURGERY — LEFT HEART CATH AND CORS/GRAFTS ANGIOGRAPHY
Anesthesia: LOCAL

## 2019-07-25 MED ORDER — HEPARIN (PORCINE) IN NACL 1000-0.9 UT/500ML-% IV SOLN
INTRAVENOUS | Status: AC
  Filled 2019-07-25: qty 1000

## 2019-07-25 MED ORDER — SODIUM CHLORIDE 0.9% FLUSH: 3.0000 mL | Freq: Two times a day (BID) | INTRAVENOUS | Status: DC

## 2019-07-25 MED ORDER — ONDANSETRON HCL 4 MG/2ML IJ SOLN: 4.0000 mg | Freq: Four times a day (QID) | INTRAMUSCULAR | Status: DC | PRN

## 2019-07-25 MED ORDER — HYDRALAZINE HCL 20 MG/ML IJ SOLN: 10.0000 mg | INTRAMUSCULAR | Status: DC | PRN

## 2019-07-25 MED ORDER — FENTANYL CITRATE (PF) 100 MCG/2ML IJ SOLN
INTRAMUSCULAR | Status: DC | PRN
  Administered 2019-07-25: 25 ug via INTRAVENOUS

## 2019-07-25 MED ORDER — SODIUM CHLORIDE 0.9% FLUSH: 3.0000 mL | INTRAVENOUS | Status: DC | PRN

## 2019-07-25 MED ORDER — IOHEXOL 350 MG/ML SOLN
INTRAVENOUS | Status: DC | PRN
  Administered 2019-07-25: 75 mL via INTRA_ARTERIAL

## 2019-07-25 MED ORDER — MIDAZOLAM HCL 2 MG/2ML IJ SOLN
INTRAMUSCULAR | Status: DC | PRN
  Administered 2019-07-25: 1 mg via INTRAVENOUS

## 2019-07-25 MED ORDER — MIDAZOLAM HCL 2 MG/2ML IJ SOLN
INTRAMUSCULAR | Status: AC
  Filled 2019-07-25: qty 2

## 2019-07-25 MED ORDER — ACETAMINOPHEN 325 MG PO TABS: 650.0000 mg | ORAL_TABLET | ORAL | Status: DC | PRN

## 2019-07-25 MED ORDER — LIDOCAINE HCL (PF) 1 % IJ SOLN
INTRAMUSCULAR | Status: AC
  Filled 2019-07-25: qty 30

## 2019-07-25 MED ORDER — FENTANYL CITRATE (PF) 100 MCG/2ML IJ SOLN
INTRAMUSCULAR | Status: AC
  Filled 2019-07-25: qty 2

## 2019-07-25 MED ORDER — LABETALOL HCL 5 MG/ML IV SOLN: 10.0000 mg | INTRAVENOUS | Status: DC | PRN

## 2019-07-25 MED ORDER — ASPIRIN 81 MG PO CHEW: 81.0000 mg | CHEWABLE_TABLET | ORAL | Status: DC

## 2019-07-25 MED ORDER — SODIUM CHLORIDE 0.9 % IV SOLN: INTRAVENOUS | Status: DC

## 2019-07-25 MED ORDER — SODIUM CHLORIDE 0.9 % WEIGHT BASED INFUSION: 1.0000 mL/kg/h | INTRAVENOUS | Status: DC

## 2019-07-25 MED ORDER — LIDOCAINE HCL (PF) 1 % IJ SOLN
INTRAMUSCULAR | Status: DC | PRN
  Administered 2019-07-25: 30 mL via INTRADERMAL

## 2019-07-25 MED ORDER — SODIUM CHLORIDE 0.9 % IV SOLN: 250.0000 mL | INTRAVENOUS | Status: DC | PRN

## 2019-07-25 MED ORDER — SODIUM CHLORIDE 0.9 % WEIGHT BASED INFUSION
3.0000 mL/kg/h | INTRAVENOUS | Status: AC
  Administered 2019-07-25: 3 mL/kg/h via INTRAVENOUS

## 2019-07-25 MED ORDER — HEPARIN (PORCINE) IN NACL 1000-0.9 UT/500ML-% IV SOLN
INTRAVENOUS | Status: DC | PRN
  Administered 2019-07-25 (×2): 500 mL

## 2019-07-25 SURGICAL SUPPLY — 11 items
CATH DXT MULTI JL4 JR4 ANG PIG (CATHETERS) ×2 IMPLANT
CATH INFINITI 5 FR 3DRC (CATHETERS) ×2 IMPLANT
CLOSURE MYNX CONTROL 5F (Vascular Products) ×2 IMPLANT
GUIDEWIRE ANGLED .035X150CM (WIRE) ×2 IMPLANT
KIT HEART LEFT (KITS) ×2 IMPLANT
PACK CARDIAC CATHETERIZATION (CUSTOM PROCEDURE TRAY) ×2 IMPLANT
SHEATH PINNACLE 5F 10CM (SHEATH) ×2 IMPLANT
SYR MEDRAD MARK 7 150ML (SYRINGE) ×2 IMPLANT
TRANSDUCER W/STOPCOCK (MISCELLANEOUS) ×2 IMPLANT
TUBING CIL FLEX 10 FLL-RA (TUBING) ×2 IMPLANT
WIRE EMERALD 3MM-J .035X150CM (WIRE) ×2 IMPLANT

## 2019-07-25 NOTE — Interval H&P Note (Signed)
History and Physical Interval Note:  07/25/2019 11:22 AM  Fernando Walker  has presented today for surgery, with the diagnosis of unstable angina. The various methods of treatment have been discussed with the patient and family. After consideration of risks, benefits and other options for treatment, the patient has consented to  Procedure(s): LEFT HEART CATH AND CORS/GRAFTS ANGIOGRAPHY (N/A) as a surgical intervention.  The patient's history has been reviewed, patient examined, no change in status, stable for surgery.  I have reviewed the patient's chart and labs.  Questions were answered to the patient's satisfaction.    Cath Lab Visit (complete for each Cath Lab visit)  Clinical Evaluation Leading to the Procedure:   ACS: No.  Non-ACS:    Anginal Classification: CCS III  Anti-ischemic medical therapy: Minimal Therapy (1 class of medications)  Non-Invasive Test Results: No non-invasive testing performed  Prior CABG: Previous CABG         Lauree Chandler

## 2019-07-25 NOTE — Progress Notes (Signed)
Discharge instructions reviewed with pt and his son (Via telephone) both voice understanding.

## 2019-07-25 NOTE — Progress Notes (Signed)
Ambulated to bathroom and hallway  tol well. 

## 2019-07-25 NOTE — Discharge Instructions (Signed)
Femoral Site Care °This sheet gives you information about how to care for yourself after your procedure. Your health care provider may also give you more specific instructions. If you have problems or questions, contact your health care provider. °What can I expect after the procedure? °After the procedure, it is common to have: °· Bruising that usually fades within 1-2 weeks. °· Tenderness at the site. °Follow these instructions at home: °Wound care °· Follow instructions from your health care provider about how to take care of your insertion site. Make sure you: °? Wash your hands with soap and water before you change your bandage (dressing). If soap and water are not available, use hand sanitizer. °? Change your dressing as told by your health care provider. °? Leave stitches (sutures), skin glue, or adhesive strips in place. These skin closures may need to stay in place for 2 weeks or longer. If adhesive strip edges start to loosen and curl up, you may trim the loose edges. Do not remove adhesive strips completely unless your health care provider tells you to do that. °· Do not take baths, swim, or use a hot tub until your health care provider approves. °· You may shower 24-48 hours after the procedure or as told by your health care provider. °? Gently wash the site with plain soap and water. °? Pat the area dry with a clean towel. °? Do not rub the site. This may cause bleeding. °· Do not apply powder or lotion to the site. Keep the site clean and dry. °· Check your femoral site every day for signs of infection. Check for: °? Redness, swelling, or pain. °? Fluid or blood. °? Warmth. °? Pus or a bad smell. °Activity °· For the first 2-3 days after your procedure, or as long as directed: °? Avoid climbing stairs as much as possible. °? Do not squat. °· Do not lift anything that is heavier than 10 lb (4.5 kg), or the limit that you are told, until your health care provider says that it is safe. °· Rest as  directed. °? Avoid sitting for a long time without moving. Get up to take short walks every 1-2 hours. °· Do not drive for 24 hours if you were given a medicine to help you relax (sedative). °General instructions °· Take over-the-counter and prescription medicines only as told by your health care provider. °· Keep all follow-up visits as told by your health care provider. This is important. °Contact a health care provider if you have: °· A fever or chills. °· You have redness, swelling, or pain around your insertion site. °Get help right away if: °· The catheter insertion area swells very fast. °· You pass out. °· You suddenly start to sweat or your skin gets clammy. °· The catheter insertion area is bleeding, and the bleeding does not stop when you hold steady pressure on the area. °· The area near or just beyond the catheter insertion site becomes pale, cool, tingly, or numb. °These symptoms may represent a serious problem that is an emergency. Do not wait to see if the symptoms will go away. Get medical help right away. Call your local emergency services (911 in the [place] Do not drive yourself to the hospital. °Summary °· After the procedure, it is common to have bruising that usually fades within 1-2 weeks. °· Check your femoral site every day for signs of infection. °· Do not lift anything that is heavier than 10 lb (4.5 kg), or the   limit that you are told, until your health care provider says that it is safe. °This information is not intended to replace advice given to you by your health care provider. Make sure you discuss any questions you have with your health care provider. °Document Released: 06/07/2014 Document Revised: 10/17/2017 Document Reviewed: 10/17/2017 °[employer]vier Patient Education © 2020 [employer] ° °

## 2019-07-26 ENCOUNTER — Ambulatory Visit (INDEPENDENT_AMBULATORY_CARE_PROVIDER_SITE_OTHER): Payer: Medicare Other

## 2019-07-26 ENCOUNTER — Encounter (HOSPITAL_COMMUNITY): Payer: Self-pay | Admitting: Cardiovascular Disease

## 2019-07-26 DIAGNOSIS — I471 Supraventricular tachycardia: Secondary | ICD-10-CM | POA: Diagnosis not present

## 2019-07-27 ENCOUNTER — Ambulatory Visit: Payer: Medicare Other | Admitting: Cardiology

## 2019-07-29 ENCOUNTER — Encounter: Payer: Self-pay | Admitting: Internal Medicine

## 2019-07-31 ENCOUNTER — Telehealth: Payer: Medicare Other | Admitting: Physician Assistant

## 2019-07-31 ENCOUNTER — Encounter: Payer: Self-pay | Admitting: Internal Medicine

## 2019-07-31 DIAGNOSIS — I350 Nonrheumatic aortic (valve) stenosis: Secondary | ICD-10-CM | POA: Insufficient documentation

## 2019-07-31 DIAGNOSIS — I479 Paroxysmal tachycardia, unspecified: Secondary | ICD-10-CM | POA: Insufficient documentation

## 2019-07-31 NOTE — Progress Notes (Signed)
Cardiology Office Note    Date:  08/01/2019   ID:  Fernando Walker, DOB 1933/04/26, MRN [id number]  PCP:  Binnie Rail, MD  Cardiologist: Lauree Chandler, MD EPS: None  Chief Complaint  Patient presents with   Hospitalization Follow-up    History of Present Illness:  Fernando Walker is a 85 y.o. male with history of CAD S/P CABG x 7 1995, last cath 2017 patent grafts with  Total prox occlusion of all native vessels, myoview 06/2013 for dyspnea inf scar with peri-infact ischemia, intermediate risk study-no chest pain so never had cath. HTN, HLD, mild bilateral carotid disease 2017. Echo 2019 LVEF 65-70% mild AS mean gradient 13 mmgHg.   I saw patient 07/17/19 with exertional chest tightness increased BP(never had HTN) and dizziness. Under a lot of stress taking care of wife with dementia. Also having tachycardia. I added metoprolol. Dr. Angelena Form recommended cath 07/25/19 which showed 6/7 patent bypass grafts, patent SVG to RV marginal with chronic occlusion of distal sequential limb of graft to PDA. Medical management recommended. Echo 07/20/19 normal LVEF mild AS.2 week monitor ordered.  Patient comes in for f/u. He still has some chest pain but is convinced it's nerves. His wife woke up in the middle of the night irritated with colostomy and pulls it off often. Not getting rest. Dizzy with metoprolol and he stopped it.      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   Hyperlipidemia    Hypertension    Hypertrophy of prostate with urinary obstruction and other lower urinary tract symptoms (LUTS)    Pneumonia    As a child   Pneumonia    as child    Past Surgical History:  Procedure Laterality Date   cataracts     both eyes   COLONOSCOPY      colonoscopy with polypectomy  2004   [place]   X 7   FASCIECTOMY Left 11/29/2017   Procedure: SEGMENTAL FASCIECTOMY LEFT SMALL FINGER;  Surgeon: Daryll Brod, MD;  Location: [facility];  Service: Orthopedics;  Laterality: Left;  block and MAC   LEFT HEART CATH AND CORS/GRAFTS ANGIOGRAPHY N/A 07/25/2019   Procedure: LEFT HEART CATH AND CORS/GRAFTS ANGIOGRAPHY;  Surgeon: Burnell Blanks, MD;  Location: [facility];  Service: Cardiovascular;  Laterality: N/A;   POLYPECTOMY      Current Medications: Current Meds  Medication Sig   aspirin 81 MG tablet Take 81 mg by mouth every evening.    bimatoprost (LUMIGAN) 0.01 % SOLN Place 1 drop into both eyes at bedtime.   metoprolol tartrate (LOPRESSOR) 25 MG tablet Take 0.5 tablets (12.5 mg total) by mouth 2 (two) times daily.   nitroGLYCERIN (NITROSTAT) 0.4 MG SL tablet Place 1 tablet (0.4 mg total) under the tongue every 5 (five) minutes as needed for chest pain.   simvastatin (ZOCOR) 20 MG tablet TAKE 1 TABLET BY MOUTH AT  BEDTIME   triamcinolone ointment (KENALOG) 0.1 % Apply 1 application topically 2 (two) times daily as needed (rash).   vitamin B-12 (CYANOCOBALAMIN) 1000 MCG tablet Take 1,000 mcg by mouth every evening.     Allergies:   Sulfonamide derivatives   Social History   Socioeconomic History  Marital status: Married    Spouse name: Not on file   Number of children: 2   Years of education: Not on file   Highest education level: Not on file  Occupational History   Occupation: retired  Scientist, product/process development strain: Not hard at [employer] insecurity    Worry: Never true    Inability: Never true   Transportation needs    Medical: No    Non-medical: No  Tobacco Use   Smoking status: Former Smoker    Quit date: 10/18/1984    Years since quitting: 34.8   Smokeless tobacco: Former Systems developer  Substance and Sexual Activity   Alcohol use: Yes     Alcohol/week: 7.0 standard drinks    Types: 7 Glasses of wine per week   Drug use: No   Sexual activity: Not Currently  Lifestyle   Physical activity    Days per week: 4 days    Minutes per session: 50 min   Stress: Rather much  Relationships   Social connections    Talks on phone: More than three times a week    Gets together: More than three times a week    Attends religious service: More than 4 times per year    Active member of club or organization: Yes    Attends meetings of clubs or organizations: More than 4 times per year    Relationship status: Married  Other Topics Concern   Not on file  Social History Narrative   Not on file     Family History:  The patient's   family history includes Stroke (age of onset: 16) in his father.   ROS:   Please see the history of present illness.    ROS All other systems reviewed and are negative.   PHYSICAL EXAM:   VS:  BP 132/64    Pulse 75    Ht _0  (1.88 m)    Wt 203 lb 12.8 oz (92.4 kg)    SpO2 95%    BMI 26.17 kg/m   Physical Exam  GEN: Well nourished, well developed, in no acute distress  Neck: no JVD, carotid bruits, or masses Cardiac:RRR; 2/6 systolic murmur at the left sternal border Respiratory:  clear to auscultation bilaterally, normal work of breathing GI: soft, nontender, nondistended, + BS Ext: Right groin bruised at cath site but no hematoma or hemorrhage.  Otherwise lower extremities without cyanosis, clubbing, or edema, Good distal pulses bilaterally Neuro:  Alert and Oriented x 3 Psych: euthymic mood, full affect  Wt Readings from Last 3 Encounters:  08/01/19 203 lb 12.8 oz (92.4 kg)  07/25/19 205 lb (93 kg)  07/17/19 205 lb 1.9 oz (93 kg)      Studies/Labs Reviewed:   EKG:  EKG is not ordered today.   Recent Labs: 01/22/2019: ALT 19 07/17/2019: BUN 17; Creatinine, Ser 1.22; Hemoglobin 14.1; Platelets 280; Potassium 4.4; Sodium 141   Lipid Panel    Component Value Date/Time   CHOL 142  01/22/2019 0801   TRIG 56 01/22/2019 0801   HDL 64 01/22/2019 0801   CHOLHDL 2.2 01/22/2019 0801   CHOLHDL 2 08/27/2016 1007   VLDL 11.6 08/27/2016 1007   LDLCALC 67 01/22/2019 0801    Additional studies/ records that were reviewed today include:  Cath 07/25/19  Prox RCA lesion is 100% stenosed.  Ost LAD to Prox LAD lesion is 100% stenosed.  Ost Cx to Prox Cx lesion is 100% stenosed.  LIMA graft was visualized by angiography and is normal in caliber.  SVG graft was visualized by angiography and is normal in caliber.  SVG graft was visualized by angiography and is normal in caliber.  Prox Graft to Mid Graft lesion between Acute Mrg and RPDA is 100% stenosed.  SVG graft was visualized by angiography.   1. Severe triple vessel CAD with chronic occlusion of the distal left main artery and the proximal RCA s/p 7V CABG with 6/7 patent bypass grafts 2. Patent LIMA to LAD 3. Patent sequential SVG to Diagonal 1 and Diagonal 2.  4. Patent sequential SVG to obtuse marginal 1 and obtuse marginal 2 5. Patent SVG to RV marginal branch with chronic occlusion of the distal sequential limb of the graft to the PDA.    Recommendations: Continue medical management of CAD   Echo 10/2/20IMPRESSIONS      1. Left ventricular ejection fraction, by visual estimation, is 60 to 65%. The left ventricle has normal function. Mildly increased left ventricular size. There is no left ventricular hypertrophy.  2. Left ventricular diastolic Doppler parameters are consistent with impaired relaxation pattern of LV diastolic filling.  3. Global right ventricle has normal systolic function.The right ventricular size is normal. No increase in right ventricular wall thickness.  4. Left atrial size was normal.  5. Right atrial size was normal.  6. The mitral valve is normal in structure. No evidence of mitral valve regurgitation.  7. The tricuspid valve is normal in structure. Tricuspid valve regurgitation is  trivial.  8. The aortic valve is tricuspid Aortic valve regurgitation was not visualized by color flow Doppler. Mild aortic valve stenosis.  9. The pulmonic valve was normal in structure. Pulmonic valve regurgitation is not visualized by color flow Doppler. 10. Mildly elevated pulmonary artery systolic pressure. 11. The inferior vena cava is normal in size with greater than 50% respiratory variability, suggesting right atrial pressure of 3 mmHg.   FINDINGS  Left Ventricle: Left ventricular ejection fraction, by visual estimation, is 60 to 65%. The left ventricle has normal function. There is no left ventricular hypertrophy. Mildly increased left ventricular size. Spectral Doppler shows Left ventricular  diastolic Doppler parameters are consistent with impaired relaxation pattern of LV diastolic filling.   Right Ventricle: The right ventricular size is normal. No increase in right ventricular wall thickness. Global RV systolic function is has normal systolic function. The tricuspid regurgitant velocity is 2.33 m/s, and with an assumed right atrial pressure  of 10 mmHg, the estimated right ventricular systolic pressure is mildly elevated at 31.7 mmHg.   Left Atrium: Left atrial size was normal in size.   Right Atrium: Right atrial size was normal in size   Pericardium: There is no evidence of pericardial effusion.   Mitral Valve: The mitral valve is normal in structure. No evidence of mitral valve regurgitation.   Tricuspid Valve: The tricuspid valve is normal in structure. Tricuspid valve regurgitation is trivial by color flow Doppler.   Aortic Valve: The aortic valve is tricuspid. Aortic valve regurgitation was not visualized by color flow Doppler. Mild aortic stenosis is present. Aortic valve mean gradient measures 13.4 mmHg. Aortic valve peak gradient measures 27.0 mmHg. Aortic valve  area, by VTI measures 1.50 cm.   Pulmonic Valve: The pulmonic valve was normal in structure. Pulmonic  valve regurgitation is not visualized by color flow Doppler.   Aorta: The aortic root is normal in size and structure.   Venous: The inferior vena cava is normal  in size with greater than 50% respiratory variability, suggesting right atrial pressure of 3 mmHg.      ASSESSMENT:    1. Coronary artery disease of native artery of native heart with stable angina pectoris ([place])   2. Aortic valve stenosis, etiology of cardiac valve disease unspecified   3. Essential hypertension   4. Dyslipidemia   5. Paroxysmal tachycardia, unspecified ([place])       PLAN:  In order of problems listed above:  CAD S/P CABG x 7 1995, cath 2017 patent grafts with Total prox occlusion of all native vessels,  Cath 07/25/19 which showed 6/7 patent bypass grafts, patent SVG to RV marginal with chronic occlusion of distal sequential limb of graft to PDA. Medical management recommended.  Still having some angina but thinks it is all stress related.  I offered to try Imdur but he wants to see if he has more consistent chest pain before he tries it.  He did not tolerate metoprolol because of dizziness.  Mild AS on echo 07/20/19-normal LVEF  Essential HTN blood pressure tends to run low normal  HLD LDL 67 01/2019  Tachycardia-metoprlol added but he did not tolerate it and holter ordered-still wearing-follow-up after Holter.      Medication Adjustments/Labs and Tests Ordered: Current medicines are reviewed at length with the patient today.  Concerns regarding medicines are outlined above.  Medication changes, Labs and Tests ordered today are listed in the Patient Instructions below. There are no Patient Instructions on file for this visit.   Signed, Ermalinda Barrios, PA-C  08/01/2019 12:21 PM    [facility] [place], [place], [place]  [address] Phone: [phone]; Fax: [phone]

## 2019-08-01 ENCOUNTER — Other Ambulatory Visit: Payer: Self-pay

## 2019-08-01 ENCOUNTER — Encounter: Payer: Self-pay | Admitting: Physician Assistant

## 2019-08-01 ENCOUNTER — Ambulatory Visit (INDEPENDENT_AMBULATORY_CARE_PROVIDER_SITE_OTHER): Payer: Medicare Other | Admitting: Physician Assistant

## 2019-08-01 VITALS — BP 132/64 | HR 75 | Ht 74.0 in | Wt 203.8 lb

## 2019-08-01 DIAGNOSIS — I25118 Atherosclerotic heart disease of native coronary artery with other forms of angina pectoris: Secondary | ICD-10-CM | POA: Diagnosis not present

## 2019-08-01 DIAGNOSIS — I6523 Occlusion and stenosis of bilateral carotid arteries: Secondary | ICD-10-CM

## 2019-08-01 DIAGNOSIS — I10 Essential (primary) hypertension: Secondary | ICD-10-CM | POA: Diagnosis not present

## 2019-08-01 DIAGNOSIS — E785 Hyperlipidemia, unspecified: Secondary | ICD-10-CM | POA: Diagnosis not present

## 2019-08-01 DIAGNOSIS — I479 Paroxysmal tachycardia, unspecified: Secondary | ICD-10-CM

## 2019-08-01 DIAGNOSIS — I350 Nonrheumatic aortic (valve) stenosis: Secondary | ICD-10-CM

## 2019-08-01 NOTE — Patient Instructions (Signed)
Medication Instructions:  Your physician recommends that you continue on your current medications as directed. Please refer to the Current Medication list given to you today.  If you need a refill on your cardiac medications before your next appointment, please call your pharmacy.   Lab work: None Ordered  If you have labs (blood work) drawn today and your tests are completely normal, you will receive your results only by: Marland Kitchen MyChart Message (if you have MyChart) OR . A paper copy in the mail If you have any lab test that is abnormal or we need to change your treatment, we will call you to review the results.  Testing/Procedures: None ordered  Follow-Up: . Follow up with Ermalinda Barrios, PA on 08/29/19 at 2:00 PM  Any Other Special Instructions Will Be Listed Below (If Applicable).

## 2019-08-05 NOTE — Progress Notes (Signed)
Subjective:    Patient ID: Fernando Walker, male    DOB: [birth year], 85 y.o.   MRN: [id number]  HPI The patient is here for an acute visit.   Tick bite:  He was bit by a tick a couple of months ago on his right posterior proximal lower leg.  He still has a red area and it looks like a bite.  It itches on occasion, but he is able to avoid itching it.  There is no pain.  Has not changed much.  In his past something like this will take 6 months to go away.  He just wanted me to look at it briefly.  Make sure it was okay.  Caregiver stress: His wife is my patient and she has dementia.  She also has a colostomy.  She has been experiencing increased confusion in the evening.  She scratches her herself until she bleeds in a few different areas.  She is also been pulling out her colostomy.  He has someone coming in 5 days a week to help with his wife.  She needs assistance with all ADL's.  He gets agitated, frustrated and at times thinks about putting her in a nursing home, but does not want to do that.  He is unsure if he can continue to care for her.  He thinks he needs help.  He is unsure if he needs medication he can take as needed when he gets very aggravated.   Medications and allergies reviewed with patient and updated if appropriate.  Patient Active Problem List   Diagnosis Date Noted  . Aortic stenosis 07/31/2019  . Paroxysmal tachycardia, unspecified ([place]) 07/31/2019  . Coronary artery disease of native artery of native heart with stable angina pectoris ([place])   . Tick bite of right lower leg 04/27/2019  . Insect bite of left lower leg with local reaction 04/27/2019  . Diarrhea of infectious origin 09/20/2018  . Pneumonia   . Hypertension   . GERD (gastroesophageal reflux disease)   . Dyslipidemia   . Carotid arterial disease ([place])   . CAD (coronary artery disease) of artery bypass graft   . Dupuytren's contracture of left hand 07/20/2017  . Skin abnormality 07/20/2017  .  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 Hx of adenomatous colonic polyps 07/27/2011  . Carotid bruit 06/02/2011  . FASTING HYPERGLYCEMIA 11/10/2009  . Essential hypertension 04/29/2009  . CAD, ARTERY BYPASS GRAFT 04/29/2009  . MYOCARDIAL PERFUSION SCAN, WITH STRESS TEST, ABNORMAL 04/29/2009  . Hyperlipidemia 04/24/2009  . HYPERTROPHY PROSTATE W/UR OBST & OTH LUTS 04/22/2009    Current Outpatient Medications on File Prior to Visit  Medication Sig Dispense Refill  . aspirin 81 MG tablet Take 81 mg by mouth every evening.     . bimatoprost (LUMIGAN) 0.01 % SOLN Place 1 drop into both eyes at bedtime.    . metoprolol tartrate (LOPRESSOR) 25 MG tablet Take 0.5 tablets (12.5 mg total) by mouth 2 (two) times daily. 180 tablet 3  . nitroGLYCERIN (NITROSTAT) 0.4 MG SL tablet Place 1 tablet (0.4 mg total) under the tongue every 5 (five) minutes as needed for chest pain. 25 tablet 0  . simvastatin (ZOCOR) 20 MG tablet TAKE 1 TABLET BY MOUTH AT  BEDTIME 90 tablet 1  . triamcinolone ointment (KENALOG) 0.1 % Apply 1 application topically 2 (two) times daily as needed (rash).    . vitamin B-12 (CYANOCOBALAMIN) 1000 MCG tablet Take 1,000 mcg  by mouth every evening.     No current facility-administered medications on file prior to visit.     Past Medical History:  Diagnosis Date  . CAD (coronary artery disease) of artery bypass graft   . Carotid arterial disease (HCC)    Nonobstructive  . Cataract   . Dyslipidemia   . GERD (gastroesophageal reflux disease)   . 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 Hyperlipidemia   . Hypertension   . Hypertrophy of prostate with urinary obstruction and other lower urinary tract symptoms (LUTS)   . Pneumonia    As a child  . Pneumonia    as child    Past Surgical History:  Procedure Laterality Date  . cataracts     both eyes  . COLONOSCOPY    . colonoscopy with polypectomy  2004  . CORONARY ARTERY BYPASS GRAFT   1995   X 7  . FASCIECTOMY Left 11/29/2017   Procedure: SEGMENTAL FASCIECTOMY LEFT SMALL FINGER;  Surgeon: Daryll Brod, MD;  Location: [facility];  Service: Orthopedics;  Laterality: Left;  block and MAC  . LEFT HEART CATH AND CORS/GRAFTS ANGIOGRAPHY N/A 07/25/2019   Procedure: LEFT HEART CATH AND CORS/GRAFTS ANGIOGRAPHY;  Surgeon: Burnell Blanks, MD;  Location: [facility];  Service: Cardiovascular;  Laterality: N/A;  . POLYPECTOMY      Social History   Socioeconomic History  . Marital status: Married    Spouse name: Not on file  . Number of children: 2  . Years of education: Not on file  . Highest education level: Not on file  Occupational History  . Occupation: retired  Scientific laboratory technician  . Financial resource strain: Not hard at all  . Food insecurity    Worry: Never true    Inability: Never true  . Transportation needs    Medical: No    Non-medical: No  Tobacco Use  . Smoking status: Former Smoker    Quit date: 10/18/1984    Years since quitting: 34.8  . Smokeless tobacco: Former Network engineer and Sexual Activity  . Alcohol use: Yes    Alcohol/week: 7.0 standard drinks    Types: 7 Glasses of wine per week  . Drug use: No  . Sexual activity: Not Currently  Lifestyle  . Physical activity    Days per week: 4 days    Minutes per session: 50 min  . Stress: Rather much  Relationships  . Social connections    Talks on phone: More than three times a week    Gets together: More than three times a week    Attends religious service: More than 4 times per year    Active member of club or organization: Yes    Attends meetings of clubs or organizations: More than 4 times per year    Relationship status: Married  Other Topics Concern  . Not on file  Social History Narrative  . Not on file    Family History  Problem Relation Age of Onset  . Stroke Father 57  . Diabetes Neg Hx   . Cancer Neg Hx   . GER disease Neg Hx   . Heart attack Neg Hx    . Colon cancer Neg Hx   . Esophageal cancer Neg Hx   . Stomach cancer Neg Hx   . Heart disease Neg Hx     Review of Systems  Constitutional: Negative for chills and fever.  Musculoskeletal: Negative for arthralgias and myalgias.  Skin: Negative for  rash.  Neurological: Negative for headaches.  Psychiatric/Behavioral: Positive for dysphoric mood. The patient is nervous/anxious (Depression/anxiety related to being a caregiver and being frustrated and aggravated at times).        Objective:   Vitals:   08/06/19 1046  BP: 116/64  Pulse: 67  Resp: 16  Temp: 98.3 F (36.8 C)  SpO2: 96%   BP Readings from Last 3 Encounters:  08/06/19 116/64  08/01/19 132/64  07/25/19 (!) 165/77   Wt Readings from Last 3 Encounters:  08/06/19 202 lb 12.8 oz (92 kg)  08/01/19 203 lb 12.8 oz (92.4 kg)  07/25/19 205 lb (93 kg)   Body mass index is 26.04 kg/m.   Physical Exam Constitutional:      General: He is not in acute distress.    Appearance: Normal appearance. He is not ill-appearing.  HENT:     Head: Normocephalic and atraumatic.  Skin:    General: Skin is warm and dry.     Findings: Lesion (Right posterior proximal lower leg bug bite-no evidence of infection, not open) present.  Neurological:     General: No focal deficit present.     Mental Status: He is alert and oriented to person, place, and time.  Psychiatric:        Mood and Affect: Mood normal.        Behavior: Behavior normal.        Thought Content: Thought content normal.        Judgment: Judgment normal.            Assessment & Plan:    See Problem List for Assessment and Plan of chronic medical problems.

## 2019-08-06 ENCOUNTER — Ambulatory Visit (INDEPENDENT_AMBULATORY_CARE_PROVIDER_SITE_OTHER): Payer: Medicare Other | Admitting: Internal Medicine

## 2019-08-06 ENCOUNTER — Other Ambulatory Visit: Payer: Self-pay

## 2019-08-06 ENCOUNTER — Encounter: Payer: Self-pay | Admitting: Internal Medicine

## 2019-08-06 DIAGNOSIS — S80861S Insect bite (nonvenomous), right lower leg, sequela: Secondary | ICD-10-CM | POA: Diagnosis not present

## 2019-08-06 DIAGNOSIS — I6523 Occlusion and stenosis of bilateral carotid arteries: Secondary | ICD-10-CM | POA: Diagnosis not present

## 2019-08-06 DIAGNOSIS — W57XXXS Bitten or stung by nonvenomous insect and other nonvenomous arthropods, sequela: Secondary | ICD-10-CM

## 2019-08-06 DIAGNOSIS — Z636 Dependent relative needing care at home: Secondary | ICD-10-CM

## 2019-08-06 MED ORDER — SERTRALINE HCL 25 MG PO TABS: 25.0000 mg | ORAL_TABLET | Freq: Every day | ORAL | 3 refills | Status: DC

## 2019-08-06 NOTE — Assessment & Plan Note (Signed)
He is experiencing caregiver stress with probably some depression and anxiety along with frustration and aggravation Discussed medication options that may help-we both agree a low-dose of a daily SSRI would be best Start sertraline 25 mg daily-he will let me know how he does with this in a few weeks and we will consider increasing it Also discussed several things that we can do to help his wife which will also help him Before Covid he was going to a support group, but that unfortunately is no longer taking place He has a CNA coming 5 days a week, which is very helpful

## 2019-08-06 NOTE — Patient Instructions (Addendum)
  Medications reviewed and updated.  Changes include :   Sertraline 25 mg daily  Your prescription(s) have been submitted to your pharmacy. Please take as directed and contact our office if you believe you are having problem(s) with the medication(s).

## 2019-08-06 NOTE — Assessment & Plan Note (Signed)
Has not changed in appearance No evidence of infection Bug bites in the past have taken months for them to go away and most likely this will be the same He was reassured

## 2019-08-20 DIAGNOSIS — I471 Supraventricular tachycardia: Secondary | ICD-10-CM | POA: Diagnosis not present

## 2019-08-24 ENCOUNTER — Other Ambulatory Visit: Payer: Self-pay

## 2019-08-24 ENCOUNTER — Ambulatory Visit (INDEPENDENT_AMBULATORY_CARE_PROVIDER_SITE_OTHER): Payer: Medicare Other | Admitting: Cardiovascular Disease

## 2019-08-24 ENCOUNTER — Encounter: Payer: Self-pay | Admitting: Cardiovascular Disease

## 2019-08-24 VITALS — BP 140/62 | HR 64 | Ht 74.0 in | Wt 201.8 lb

## 2019-08-24 DIAGNOSIS — I350 Nonrheumatic aortic (valve) stenosis: Secondary | ICD-10-CM | POA: Diagnosis not present

## 2019-08-24 DIAGNOSIS — I25118 Atherosclerotic heart disease of native coronary artery with other forms of angina pectoris: Secondary | ICD-10-CM | POA: Diagnosis not present

## 2019-08-24 DIAGNOSIS — I480 Paroxysmal atrial fibrillation: Secondary | ICD-10-CM | POA: Diagnosis not present

## 2019-08-24 DIAGNOSIS — I10 Essential (primary) hypertension: Secondary | ICD-10-CM | POA: Diagnosis not present

## 2019-08-24 DIAGNOSIS — I6523 Occlusion and stenosis of bilateral carotid arteries: Secondary | ICD-10-CM

## 2019-08-24 MED ORDER — METOPROLOL TARTRATE 25 MG PO TABS: 25.0000 mg | ORAL_TABLET | Freq: Every day | ORAL | 0 refills | Status: DC | PRN

## 2019-08-24 MED ORDER — APIXABAN 5 MG PO TABS: 5.0000 mg | ORAL_TABLET | Freq: Two times a day (BID) | ORAL | 11 refills | Status: DC

## 2019-08-24 NOTE — Patient Instructions (Signed)
Medication Instructions:  Your physician has recommended you make the following change in your medication:   1. STOP: Aspirin  2. START: eliquis 5 mg tablet: Take 1 tablet by mouth twice a day with food  3. CHANGE: metoprolol tartrate (lopressor) 25 mg tablet: Take 1 tablet by mouth AS NEEDED for palpitations  *If you need a refill on your cardiac medications before your next appointment, please call your pharmacy*  Lab Work: None ordered If you have labs (blood work) drawn today and your tests are completely normal, you will receive your results only by: Marland Kitchen MyChart Message (if you have MyChart) OR . A paper copy in the mail If you have any lab test that is abnormal or we need to change your treatment, we will call you to review the results.  Testing/Procedures: None ordered  Follow-Up: At [facility], you and your health needs are our priority.  As part of our continuing mission to provide you with exceptional heart care, we have created designated Provider Care Teams.  These Care Teams include your primary Cardiologist (physician) and Advanced Practice Providers (APPs -  Physician Assistants and Nurse Practitioners) who all work together to provide you with the care you need, when you need it.  Your next appointment:   Follow up with Ermalinda Barrios, PA on 10/03/19 at 12:00 PM  Other Instructions

## 2019-08-24 NOTE — Progress Notes (Signed)
Chief Complaint  Patient presents with  . Follow-up    CAD   History of Present Illness: 85 yo male with history of CAD, HTN and HLD here today for cardiac follow up. He underwent 7V CABG in 1995. Carotid artery dopplers November 2017 with mild bilateral disease. Echo January 2019 with normal LV function, LVEF=65-70%,  mild AS (mean gradient 13 mmHg). He was seen in our office September 2020 by Ermalinda Barrios, PA and c/o chest tightness. Cardiac cath 10/720 with 6/7 patent bypass grafts. The distal limb of the SVG to the RV marginal was chronically occluded. Echo 07/20/19 with normal LVEF and mild AS. Cardiac monitor October 2020 was returned this week and showed atrial fibrillation with longest episode 1 hour.   He is here today for follow up. The patient denies any chest pain, dyspnea, lower extremity edema, orthopnea, PND, dizziness, near syncope or syncope. He has felt several episodes of his heart racing. He is under tremendous stress at home due to his wife having dementia and a colostomy bag.    Primary Care Physician: Binnie Rail, MD  Past Medical History:  Diagnosis Date  . CAD (coronary artery disease) of artery bypass graft   . Carotid arterial disease (HCC)    Nonobstructive  . Cataract   . Dyslipidemia   . GERD (gastroesophageal reflux disease)   . 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 Hyperlipidemia   . Hypertension   . Hypertrophy of prostate with urinary obstruction and other lower urinary tract symptoms (LUTS)   . Pneumonia    As a child  . Pneumonia    as child    Past Surgical History:  Procedure Laterality Date  . cataracts     both eyes  . COLONOSCOPY    . colonoscopy with polypectomy  2004  . CORONARY ARTERY BYPASS GRAFT  1995   X 7  . FASCIECTOMY Left 11/29/2017   Procedure: SEGMENTAL FASCIECTOMY LEFT SMALL FINGER;  Surgeon: Daryll Brod, MD;  Location: [facility];  Service: Orthopedics;  Laterality: Left;  block and MAC  . LEFT HEART CATH AND  CORS/GRAFTS ANGIOGRAPHY N/A 07/25/2019   Procedure: LEFT HEART CATH AND CORS/GRAFTS ANGIOGRAPHY;  Surgeon: Burnell Blanks, MD;  Location: [facility];  Service: Cardiovascular;  Laterality: N/A;  . POLYPECTOMY      Current Outpatient Medications  Medication Sig Dispense Refill  . bimatoprost (LUMIGAN) 0.01 % SOLN Place 1 drop into both eyes at bedtime.    . metoprolol tartrate (LOPRESSOR) 25 MG tablet Take 1 tablet (25 mg total) by mouth daily as needed (palpitations). 90 tablet 0  . nitroGLYCERIN (NITROSTAT) 0.4 MG SL tablet Place 1 tablet (0.4 mg total) under the tongue every 5 (five) minutes as needed for chest pain. 25 tablet 0  . sertraline (ZOLOFT) 25 MG tablet Take 1 tablet (25 mg total) by mouth daily. 90 tablet 3  . simvastatin (ZOCOR) 20 MG tablet TAKE 1 TABLET BY MOUTH AT  BEDTIME 90 tablet 1  . triamcinolone ointment (KENALOG) 0.1 % Apply 1 application topically 2 (two) times daily as needed (rash).    . vitamin B-12 (CYANOCOBALAMIN) 1000 MCG tablet Take 1,000 mcg by mouth every evening.    Marland Kitchen apixaban (ELIQUIS) 5 MG TABS tablet Take 1 tablet (5 mg total) by mouth 2 (two) times daily. 60 tablet 11   No current facility-administered medications for this visit.     Allergies  Allergen Reactions  . Sulfonamide Derivatives Rash  Social History   Socioeconomic History  . Marital status: Married    Spouse name: Not on file  . Number of children: 2  . Years of education: Not on file  . Highest education level: Not on file  Occupational History  . Occupation: retired  Scientific laboratory technician  . Financial resource strain: Not hard at all  . Food insecurity    Worry: Never true    Inability: Never true  . Transportation needs    Medical: No    Non-medical: No  Tobacco Use  . Smoking status: Former Smoker    Quit date: 10/18/1984    Years since quitting: 34.8  . Smokeless tobacco: Former Network engineer and Sexual Activity  . Alcohol use: Yes    Alcohol/week: 7.0  standard drinks    Types: 7 Glasses of wine per week  . Drug use: No  . Sexual activity: Not Currently  Lifestyle  . Physical activity    Days per week: 4 days    Minutes per session: 50 min  . Stress: Rather much  Relationships  . Social connections    Talks on phone: More than three times a week    Gets together: More than three times a week    Attends religious service: More than 4 times per year    Active member of club or organization: Yes    Attends meetings of clubs or organizations: More than 4 times per year    Relationship status: Married  . Intimate partner violence    Fear of current or ex partner: No    Emotionally abused: No    Physically abused: No    Forced sexual activity: No  Other Topics Concern  . Not on file  Social History Narrative  . Not on file    Family History  Problem Relation Age of Onset  . Stroke Father 56  . Diabetes Neg Hx   . Cancer Neg Hx   . GER disease Neg Hx   . Heart attack Neg Hx   . Colon cancer Neg Hx   . Esophageal cancer Neg Hx   . Stomach cancer Neg Hx   . Heart disease Neg Hx     Review of Systems:  As stated in the HPI and otherwise negative.   BP 140/62   Pulse 64   Ht _0  (1.88 m)   Wt 201 lb 12.8 oz (91.5 kg)   SpO2 98%   BMI 25.91 kg/m   Physical Examination:  General: Well developed, well nourished, NAD  HEENT: OP clear, mucus membranes moist  SKIN: warm, dry. No rashes. Neuro: No focal deficits  Musculoskeletal: Muscle strength 5/5 all ext  Psychiatric: Mood and affect normal  Neck: No JVD, no carotid bruits, no thyromegaly, no lymphadenopathy.  Lungs:Clear bilaterally, no wheezes, rhonci, crackles Cardiovascular: Regular rate and rhythm. No murmurs, gallops or rubs. Abdomen:Soft. Bowel sounds present. Non-tender.  Extremities: No lower extremity edema. Pulses are 2 + in the bilateral DP/PT.  Echo October 2020:  1. Left ventricular ejection fraction, by visual estimation, is 60 to 65%. The left  ventricle has normal function. Mildly increased left ventricular size. There is no left ventricular hypertrophy.  2. Left ventricular diastolic Doppler parameters are consistent with impaired relaxation pattern of LV diastolic filling.  3. Global right ventricle has normal systolic function.The right ventricular size is normal. No increase in right ventricular wall thickness.  4. Left atrial size was normal.  5. Right atrial size was  normal.  6. The mitral valve is normal in structure. No evidence of mitral valve regurgitation.  7. The tricuspid valve is normal in structure. Tricuspid valve regurgitation is trivial.  8. The aortic valve is tricuspid Aortic valve regurgitation was not visualized by color flow Doppler. Mild aortic valve stenosis.  9. The pulmonic valve was normal in structure. Pulmonic valve regurgitation is not visualized by color flow Doppler. 10. Mildly elevated pulmonary artery systolic pressure. 11. The inferior vena cava is normal in size with greater than 50% respiratory variability, suggesting right atrial pressure of 3 mmHg.  Cardiac cath   1. Severe triple vessel CAD with chronic occlusion of the distal left main artery and the proximal RCA s/p 7V CABG with 6/7 patent bypass grafts 2. Patent LIMA to LAD 3. Patent sequential SVG to Diagonal 1 and Diagonal 2.  4. Patent sequential SVG to obtuse marginal 1 and obtuse marginal 2 5. Patent SVG to RV marginal branch with chronic occlusion of the distal sequential limb of the graft to the PDA.   Diagnostic Dominance: Right  Intervention    EKG:  EKG is  Not ordered today. The ekg ordered today demonstrates   Recent Labs: 01/22/2019: ALT 19 07/17/2019: BUN 17; Creatinine, Ser 1.22; Hemoglobin 14.1; Platelets 280; Potassium 4.4; Sodium 141   Lipid Panel    Component Value Date/Time   CHOL 142 01/22/2019 0801   TRIG 56 01/22/2019 0801   HDL 64 01/22/2019 0801   CHOLHDL 2.2 01/22/2019 0801   CHOLHDL 2 08/27/2016  1007   VLDL 11.6 08/27/2016 1007   LDLCALC 67 01/22/2019 0801     Wt Readings from Last 3 Encounters:  08/24/19 201 lb 12.8 oz (91.5 kg)  08/06/19 202 lb 12.8 oz (92 kg)  08/01/19 203 lb 12.8 oz (92.4 kg)     Other studies Reviewed: Additional studies/ records that were reviewed today include: . Review of the above records demonstrates:   Assessment and Plan:   1. CAD s/p CABG without angina: Stable with occasional chest pains. Cardiac cath October 2020 with stable CAD. Continue ASA and statin.       2. HTN: BP normal today.   3. Hyperlipidemia: LDL at goal. Will continue statin.   4. Carotid artery disease: He has mild bilateral carotid artery disease by dopplers in November 2017. No plans to repeat given age and stability of disease over time.   5. Aortic stenosis: Mild by echo October 2020.      6. Atrial fibrillation, paroxysmal: Sinus rate of 39 bpm at night. Atrial fib rates up to 180 bpm. CHADS VASC score is 4. Will start Eliquis 5 mg po BID. Metoprolol 25 mg as needed. If he has frequent, symptomatic runs of atrial fib, will need EP referral as I am not sure he can tolerate daily AV nodal blocking agents given bradycardia.   Current medicines are reviewed at length with the patient today.  The patient does not have concerns regarding medicines.  The following changes have been made:  no change  Labs/ tests ordered today include:   No orders of the defined types were placed in this encounter.   Disposition:   FU with in 6 weeks with Ermalinda Barrios, PA.   Signed, Lauree Chandler, MD 08/24/2019 3:16 PM    [facility] [address], [place], [place]  [address] Phone: [phone]; Fax: [phone]

## 2019-08-26 ENCOUNTER — Encounter: Payer: Self-pay | Admitting: Internal Medicine

## 2019-08-26 DIAGNOSIS — R350 Frequency of micturition: Secondary | ICD-10-CM

## 2019-08-29 ENCOUNTER — Ambulatory Visit: Payer: Medicare Other | Admitting: Physician Assistant

## 2019-10-03 ENCOUNTER — Other Ambulatory Visit: Payer: Self-pay

## 2019-10-03 ENCOUNTER — Ambulatory Visit (INDEPENDENT_AMBULATORY_CARE_PROVIDER_SITE_OTHER): Payer: Medicare Other | Admitting: Physician Assistant

## 2019-10-03 ENCOUNTER — Encounter: Payer: Self-pay | Admitting: Physician Assistant

## 2019-10-03 VITALS — BP 122/60 | HR 80 | Ht 74.0 in | Wt 199.2 lb

## 2019-10-03 DIAGNOSIS — I6523 Occlusion and stenosis of bilateral carotid arteries: Secondary | ICD-10-CM

## 2019-10-03 DIAGNOSIS — I350 Nonrheumatic aortic (valve) stenosis: Secondary | ICD-10-CM | POA: Diagnosis not present

## 2019-10-03 DIAGNOSIS — I25118 Atherosclerotic heart disease of native coronary artery with other forms of angina pectoris: Secondary | ICD-10-CM

## 2019-10-03 DIAGNOSIS — R001 Bradycardia, unspecified: Secondary | ICD-10-CM

## 2019-10-03 DIAGNOSIS — I10 Essential (primary) hypertension: Secondary | ICD-10-CM

## 2019-10-03 DIAGNOSIS — R42 Dizziness and giddiness: Secondary | ICD-10-CM

## 2019-10-03 DIAGNOSIS — I495 Sick sinus syndrome: Secondary | ICD-10-CM | POA: Diagnosis not present

## 2019-10-03 DIAGNOSIS — E7849 Other hyperlipidemia: Secondary | ICD-10-CM

## 2019-10-03 DIAGNOSIS — I480 Paroxysmal atrial fibrillation: Secondary | ICD-10-CM | POA: Diagnosis not present

## 2019-10-03 NOTE — Patient Instructions (Signed)
Medication Instructions:  Your physician recommends that you continue on your current medications as directed. Please refer to the Current Medication list given to you today.  *If you need a refill on your cardiac medications before your next appointment, please call your pharmacy*  Lab Work: TODAY:  BMET & CBC  If you have labs (blood work) drawn today and your tests are completely normal, you will receive your results only by: Marland Kitchen MyChart Message (if you have MyChart) OR . A paper copy in the mail If you have any lab test that is abnormal or we need to change your treatment, we will call you to review the results.  Testing/Procedures: None ordered  You have been referred to Dr. Crissie Sickles, Electrophysiologist.  Someone will call you with an appointment.   Follow-Up: At [facility], you and your health needs are our priority.  As part of our continuing mission to provide you with exceptional heart care, we have created designated Provider Care Teams.  These Care Teams include your primary Cardiologist (physician) and Advanced Practice Providers (APPs -  Physician Assistants and Nurse Practitioners) who all work together to provide you with the care you need, when you need it.  Your next appointment:   4 month(s)  The format for your next appointment:   In Person  Provider:   You may see Lauree Chandler, MD or one of the following Advanced Practice Providers on your designated Care Team:    Melina Copa, PA-C  Ermalinda Barrios, PA-C   Other Instructions

## 2019-10-03 NOTE — Progress Notes (Signed)
Cardiology Office Note    Date:  10/03/2019   ID:  Fernando Walker, DOB 05-14-1933, MRN [id number]  PCP:  Binnie Rail, MD  Cardiologist: Lauree Chandler, MD EPS: None  Chief Complaint  Patient presents with  . Follow-up    History of Present Illness:  Fernando Walker is a 85 y.o. male with history of hypertension, HLD, CAD status post CABG times 04-1994 cardiac cath 07/2019 with 6 out of 7 patent grafts, distal limb of the SVG to the RV marginal chronically occluded.  2D echo 07/20/2019 normal LVEF with mild AS, cardiac monitor 07/2019 atrial fibrillation with longest episode lasting 1 hour.  Sinus rates were 39 bpm at night but atrial fibrillation up to 180 bpm CHA2DS2-VASc equals 4 on Eliquis and metoprolol as needed.  He was referred to EP-but hasn't happened yet.  Patient comes in for f/u. BP running around 009 systolic and pulse in [id number]  Gets dizzy with it but no syncope. Denies any chest pain, palpitations, dyspnea, or edema. Drinks 2 cups coffee/day. No regular exercise. Stress with taking care of wife with dementia and colostomy. Getting to a point where she may need to go to a nursing home.  Has been married 84 yrs. No bleeding trouble on eliquis.    Past Medical History:  Diagnosis Date  . CAD (coronary artery disease) of artery bypass graft   . Carotid arterial disease (HCC)    Nonobstructive  . Cataract   . Dyslipidemia   . GERD (gastroesophageal reflux disease)   . 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 Hyperlipidemia   . Hypertension   . Hypertrophy of prostate with urinary obstruction and other lower urinary tract symptoms (LUTS)   . Pneumonia    As a child  . Pneumonia    as child    Past Surgical History:  Procedure Laterality Date  . cataracts     both eyes  . COLONOSCOPY    . colonoscopy with polypectomy  2004  . CORONARY ARTERY BYPASS GRAFT  1995   X 7  . FASCIECTOMY Left 11/29/2017   Procedure: SEGMENTAL FASCIECTOMY LEFT SMALL  FINGER;  Surgeon: Daryll Brod, MD;  Location: [facility];  Service: Orthopedics;  Laterality: Left;  block and MAC  . LEFT HEART CATH AND CORS/GRAFTS ANGIOGRAPHY N/A 07/25/2019   Procedure: LEFT HEART CATH AND CORS/GRAFTS ANGIOGRAPHY;  Surgeon: Burnell Blanks, MD;  Location: [facility];  Service: Cardiovascular;  Laterality: N/A;  . POLYPECTOMY      Current Medications: Current Meds  Medication Sig  . apixaban (ELIQUIS) 5 MG TABS tablet Take 1 tablet (5 mg total) by mouth 2 (two) times daily.  . bimatoprost (LUMIGAN) 0.01 % SOLN Place 1 drop into both eyes at bedtime.  . metoprolol tartrate (LOPRESSOR) 25 MG tablet Take 1 tablet (25 mg total) by mouth daily as needed (palpitations).  . nitroGLYCERIN (NITROSTAT) 0.4 MG SL tablet Place 1 tablet (0.4 mg total) under the tongue every 5 (five) minutes as needed for chest pain.  Marland Kitchen sertraline (ZOLOFT) 25 MG tablet Take 1 tablet (25 mg total) by mouth daily.  . simvastatin (ZOCOR) 20 MG tablet TAKE 1 TABLET BY MOUTH AT  BEDTIME  . triamcinolone ointment (KENALOG) 0.1 % Apply 1 application topically 2 (two) times daily as needed (rash).  . vitamin B-12 (CYANOCOBALAMIN) 1000 MCG tablet Take 1,000 mcg by mouth every evening.     Allergies:   Sulfonamide derivatives   Social History  Socioeconomic History  . Marital status: Married    Spouse name: Not on file  . Number of children: 2  . Years of education: Not on file  . Highest education level: Not on file  Occupational History  . Occupation: retired  Tobacco Use  . Smoking status: Former Smoker    Quit date: 10/18/1984    Years since quitting: 34.9  . Smokeless tobacco: Former Network engineer and Sexual Activity  . Alcohol use: Yes    Alcohol/week: 7.0 standard drinks    Types: 7 Glasses of wine per week  . Drug use: No  . Sexual activity: Not Currently  Other Topics Concern  . Not on file  Social History Narrative  . Not on file   Social  Determinants of Health   Financial Resource Strain:   . Difficulty of Paying Living Expenses: Not on file  Food Insecurity:   . Worried About Charity fundraiser in the Last Year: Not on file  . Ran Out of Food in the Last Year: Not on file  Transportation Needs:   . Lack of Transportation (Medical): Not on file  . Lack of Transportation (Non-Medical): Not on file  Physical Activity: Unknown  . Days of Exercise per Week: 4 days  . Minutes of Exercise per Session: Not on file  Stress: Stress Concern Present  . Feeling of Stress : Rather much  Social Connections:   . Frequency of Communication with Friends and Family: Not on file  . Frequency of Social Gatherings with Friends and Family: Not on file  . Attends Religious Services: Not on file  . Active Member of Clubs or Organizations: Not on file  . Attends Archivist Meetings: Not on file  . Marital Status: Not on file     Family History:  The patient's family history includes Stroke (age of onset: 51) in his father.   ROS:   Please see the history of present illness.    ROS All other systems reviewed and are negative.   PHYSICAL EXAM:   VS:  BP 122/60   Pulse 80   Ht _0  (1.88 m)   Wt 199 lb 3.2 oz (90.4 kg)   SpO2 96%   BMI 25.58 kg/m   Physical Exam  GEN: Well nourished, well developed, in no acute distress  Neck: no JVD, carotid bruits, or masses Cardiac:RRR; 2/6 systolic murmur at the left sternal border Respiratory:  clear to auscultation bilaterally, normal work of breathing GI: soft, nontender, nondistended, + BS Ext: without cyanosis, clubbing, or edema, Good distal pulses bilaterally Neuro:  Alert and Oriented x 3 Psych: euthymic mood, full affect  Wt Readings from Last 3 Encounters:  10/03/19 199 lb 3.2 oz (90.4 kg)  08/24/19 201 lb 12.8 oz (91.5 kg)  08/06/19 202 lb 12.8 oz (92 kg)      Studies/Labs Reviewed:   EKG:  EKG is not ordered today.   Recent Labs: 01/22/2019: ALT  19 07/17/2019: BUN 17; Creatinine, Ser 1.22; Hemoglobin 14.1; Platelets 280; Potassium 4.4; Sodium 141   Lipid Panel    Component Value Date/Time   CHOL 142 01/22/2019 0801   TRIG 56 01/22/2019 0801   HDL 64 01/22/2019 0801   CHOLHDL 2.2 01/22/2019 0801   CHOLHDL 2 08/27/2016 1007   VLDL 11.6 08/27/2016 1007   LDLCALC 67 01/22/2019 0801    Additional studies/ records that were reviewed today include:   Holter monitor 08/21/2019 Sinus rhythm with sinus bradycardia, slowest  heart rate 38 beats per minutes Frequent premature atrial contractions with short runs of supraventricular tachycardia Atrial fibrillation with less than 1% burden. Longest episode of atrial fibrillation was 1 hour, 7 minutes.  One 4 beat run of non-sustained ventricular tachycardia.     Echo October 2020:  1. Left ventricular ejection fraction, by visual estimation, is 60 to 65%. The left ventricle has normal function. Mildly increased left ventricular size. There is no left ventricular hypertrophy.  2. Left ventricular diastolic Doppler parameters are consistent with impaired relaxation pattern of LV diastolic filling.  3. Global right ventricle has normal systolic function.The right ventricular size is normal. No increase in right ventricular wall thickness.  4. Left atrial size was normal.  5. Right atrial size was normal.  6. The mitral valve is normal in structure. No evidence of mitral valve regurgitation.  7. The tricuspid valve is normal in structure. Tricuspid valve regurgitation is trivial.  8. The aortic valve is tricuspid Aortic valve regurgitation was not visualized by color flow Doppler. Mild aortic valve stenosis.  9. The pulmonic valve was normal in structure. Pulmonic valve regurgitation is not visualized by color flow Doppler. 10. Mildly elevated pulmonary artery systolic pressure. 11. The inferior vena cava is normal in size with greater than 50% respiratory variability, suggesting right atrial  pressure of 3 mmHg.  Cardiac cath   1. Severe triple vessel CAD with chronic occlusion of the distal left main artery and the proximal RCA s/p 7V CABG with 6/7 patent bypass grafts 2. Patent LIMA to LAD 3. Patent sequential SVG to Diagonal 1 and Diagonal 2.  4. Patent sequential SVG to obtuse marginal 1 and obtuse marginal 2 5. Patent SVG to RV marginal branch with chronic occlusion of the distal sequential limb of the graft to the PDA.        ASSESSMENT:    1. Coronary artery disease of native artery of native heart with stable angina pectoris (HCC)   2. Paroxysmal atrial fibrillation ([place])   3. Nonrheumatic aortic valve stenosis   4. Essential hypertension   5. Other hyperlipidemia   6. Dizziness   7. Tachycardia-bradycardia syndrome ([place])   8. Bradycardia      PLAN:  In order of problems listed above:  CAD S/P CABG x 7 1995, cath 2017 patent grafts with Total prox occlusion of all native vessels,  Cath 07/25/19 which showed 6/7 patent bypass grafts, patent SVG to RV marginal with chronic occlusion of distal sequential limb of graft to PDA. Medical management recommended.    No angina  Paroxysmal atrial fibrillation CHA2DS2-VASc equals 4 on Eliquis.  Heart rates down to 39 in the sleeping hours but A. fib up to 180 bpm on as needed metoprolol as he did not tolerate.  Patient never felt the atrial fibrillation.  He is having some dizziness and says his heart rate is dropping in the 50s but does not know if he is having A. fib.  Will refer to EP.   Check CBC and be met on Eliquis Mild AS on echo 07/20/19-normal LVEF   Essential HTN blood pressure tends to run low normal   HLD LDL 67 01/2019             Medication Adjustments/Labs and Tests Ordered: Current medicines are reviewed at length with the patient today.  Concerns regarding medicines are outlined above.  Medication changes, Labs and Tests ordered today are listed in the Patient Instructions below. Patient  Instructions  Medication Instructions:  Your physician recommends that you continue on your current medications as directed. Please refer to the Current Medication list given to you today.  *If you need a refill on your cardiac medications before your next appointment, please call your pharmacy*  Lab Work: TODAY:  BMET & CBC  If you have labs (blood work) drawn today and your tests are completely normal, you will receive your results only by: Marland Kitchen MyChart Message (if you have MyChart) OR . A paper copy in the mail If you have any lab test that is abnormal or we need to change your treatment, we will call you to review the results.  Testing/Procedures: None ordered  You have been referred to Dr. Crissie Sickles, Electrophysiologist.  Someone will call you with an appointment.   Follow-Up: At [facility], you and your health needs are our priority.  As part of our continuing mission to provide you with exceptional heart care, we have created designated Provider Care Teams.  These Care Teams include your primary Cardiologist (physician) and Advanced Practice Providers (APPs -  Physician Assistants and Nurse Practitioners) who all work together to provide you with the care you need, when you need it.  Your next appointment:   4 month(s)  The format for your next appointment:   In Person  Provider:   You may see Lauree Chandler, MD or one of the following Advanced Practice Providers on your designated Care Team:    Melina Copa, PA-C  Ermalinda Barrios, PA-C   Other Instructions      Signed, Ermalinda Barrios, PA-C  10/03/2019 12:10 PM    [facility] [address], [place], [place]  [address] Phone: [phone]; Fax: [phone]

## 2019-10-04 LAB — CBC
Hematocrit: 45.5 % (ref 37.5–51.0)
Hemoglobin: 14.9 g/dL (ref 13.0–17.7)
MCH: 30.3 pg (ref 26.6–33.0)
MCHC: 32.7 g/dL (ref 31.5–35.7)
MCV: 93 fL (ref 79–97)
Platelets: 278 10*3/uL (ref 150–450)
RBC: 4.92 x10E6/uL (ref 4.14–5.80)
RDW: 12.4 % (ref 11.6–15.4)
WBC: 9.8 10*3/uL (ref 3.4–10.8)

## 2019-10-04 LAB — BASIC METABOLIC PANEL
BUN/Creatinine Ratio: 12 (ref 10–24)
BUN: 13 mg/dL (ref 8–27)
CO2: 27 mmol/L (ref 20–29)
Calcium: 9.8 mg/dL (ref 8.6–10.2)
Chloride: 101 mmol/L (ref 96–106)
Creatinine, Ser: 1.07 mg/dL (ref 0.76–1.27)
GFR calc Af Amer: 73 mL/min/{1.73_m2} (ref >59)
GFR calc non Af Amer: 63 mL/min/{1.73_m2} (ref >59)
Glucose: 100 mg/dL — ABNORMAL HIGH (ref 65–99)
Potassium: 4.8 mmol/L (ref 3.5–5.2)
Sodium: 142 mmol/L (ref 134–144)

## 2019-10-23 ENCOUNTER — Encounter: Payer: Self-pay | Admitting: Internal Medicine

## 2019-11-01 ENCOUNTER — Ambulatory Visit (INDEPENDENT_AMBULATORY_CARE_PROVIDER_SITE_OTHER): Payer: Medicare Other | Admitting: Internal Medicine

## 2019-11-01 ENCOUNTER — Other Ambulatory Visit: Payer: Self-pay

## 2019-11-01 ENCOUNTER — Encounter: Payer: Self-pay | Admitting: Internal Medicine

## 2019-11-01 DIAGNOSIS — I495 Sick sinus syndrome: Secondary | ICD-10-CM

## 2019-11-01 DIAGNOSIS — R42 Dizziness and giddiness: Secondary | ICD-10-CM | POA: Diagnosis not present

## 2019-11-01 DIAGNOSIS — I480 Paroxysmal atrial fibrillation: Secondary | ICD-10-CM

## 2019-11-01 NOTE — Patient Instructions (Addendum)
Medication Instructions:  Your physician recommends that you continue on your current medications as directed. Please refer to the Current Medication list given to you today.  Labwork: None ordered.  Testing/Procedures: None ordered.  Follow-Up: Your physician wants you to follow-up in: as needed with Dr. Taylor.      Any Other Special Instructions Will Be Listed Below (If Applicable).  If you need a refill on your cardiac medications before your next appointment, please call your pharmacy.   

## 2019-11-01 NOTE — Progress Notes (Signed)
HPI [name] is referred today by Amie Portland NP, for evaluation of dizzy spells, in the setting of atrial fib. The patient does not feel his atrial fib though he does occaisionally notice palpitations. He has not had frank syncope. He wore a cardiac monitor which demonstrated atrial fib with a RVR but less than 1% of the time. He denies frank syncope. In atrial fib his rates are increased. No anginal symptoms. He has had a long h/o CAD but most recent cardiac cath demonstrated mostly patient grafts after 25 years! Allergies  Allergen Reactions  . Sulfonamide Derivatives Rash     Current Outpatient Medications  Medication Sig Dispense Refill  . apixaban (ELIQUIS) 5 MG TABS tablet Take 1 tablet (5 mg total) by mouth 2 (two) times daily. 60 tablet 11  . bimatoprost (LUMIGAN) 0.01 % SOLN Place 1 drop into both eyes at bedtime.    . metoprolol tartrate (LOPRESSOR) 25 MG tablet Take 1 tablet (25 mg total) by mouth daily as needed (palpitations). 90 tablet 0  . nitroGLYCERIN (NITROSTAT) 0.4 MG SL tablet Place 1 tablet (0.4 mg total) under the tongue every 5 (five) minutes as needed for chest pain. 25 tablet 0  . sertraline (ZOLOFT) 25 MG tablet Take 1 tablet (25 mg total) by mouth daily. 90 tablet 3  . simvastatin (ZOCOR) 20 MG tablet TAKE 1 TABLET BY MOUTH AT  BEDTIME 90 tablet 1  . triamcinolone ointment (KENALOG) 0.1 % Apply 1 application topically 2 (two) times daily as needed (rash).    . vitamin B-12 (CYANOCOBALAMIN) 1000 MCG tablet Take 1,000 mcg by mouth every evening.     No current facility-administered medications for this visit.     Past Medical History:  Diagnosis Date  . CAD (coronary artery disease) of artery bypass graft   . Carotid arterial disease (HCC)    Nonobstructive  . Cataract   . Dyslipidemia   . GERD (gastroesophageal reflux disease)   . 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 Hyperlipidemia   . Hypertension   . Hypertrophy of prostate with urinary  obstruction and other lower urinary tract symptoms (LUTS)   . Pneumonia    As a child  . Pneumonia    as child    ROS:   All systems reviewed and negative except as noted in the HPI.   Past Surgical History:  Procedure Laterality Date  . cataracts     both eyes  . COLONOSCOPY    . colonoscopy with polypectomy  2004  . CORONARY ARTERY BYPASS GRAFT  1995   X 7  . FASCIECTOMY Left 11/29/2017   Procedure: SEGMENTAL FASCIECTOMY LEFT SMALL FINGER;  Surgeon: Daryll Brod, MD;  Location: [facility];  Service: Orthopedics;  Laterality: Left;  block and MAC  . LEFT HEART CATH AND CORS/GRAFTS ANGIOGRAPHY N/A 07/25/2019   Procedure: LEFT HEART CATH AND CORS/GRAFTS ANGIOGRAPHY;  Surgeon: Burnell Blanks, MD;  Location: [facility];  Service: Cardiovascular;  Laterality: N/A;  . POLYPECTOMY       Family History  Problem Relation Age of Onset  . Stroke Father 38  . Diabetes Neg Hx   . Cancer Neg Hx   . GER disease Neg Hx   . Heart attack Neg Hx   . Colon cancer Neg Hx   . Esophageal cancer Neg Hx   . Stomach cancer Neg Hx   . Heart disease Neg Hx      Social History  Socioeconomic History  . Marital status: Married    Spouse name: Not on file  . Number of children: 2  . Years of education: Not on file  . Highest education level: Not on file  Occupational History  . Occupation: retired  Tobacco Use  . Smoking status: Former Smoker    Quit date: 10/18/1984    Years since quitting: 35.0  . Smokeless tobacco: Former Network engineer and Sexual Activity  . Alcohol use: Yes    Alcohol/week: 7.0 standard drinks    Types: 7 Glasses of wine per week  . Drug use: No  . Sexual activity: Not Currently  Other Topics Concern  . Not on file  Social History Narrative  . Not on file   Social Determinants of Health   Financial Resource Strain:   . Difficulty of Paying Living Expenses: Not on file  Food Insecurity:   . Worried About Charity fundraiser  in the Last Year: Not on file  . Ran Out of Food in the Last Year: Not on file  Transportation Needs:   . Lack of Transportation (Medical): Not on file  . Lack of Transportation (Non-Medical): Not on file  Physical Activity: Unknown  . Days of Exercise per Week: 4 days  . Minutes of Exercise per Session: Not on file  Stress: Stress Concern Present  . Feeling of Stress : Rather much  Social Connections:   . Frequency of Communication with Friends and Family: Not on file  . Frequency of Social Gatherings with Friends and Family: Not on file  . Attends Religious Services: Not on file  . Active Member of Clubs or Organizations: Not on file  . Attends Archivist Meetings: Not on file  . Marital Status: Not on file  Intimate Partner Violence:   . Fear of Current or Ex-Partner: Not on file  . Emotionally Abused: Not on file  . Physically Abused: Not on file  . Sexually Abused: Not on file     BP 102/68   Pulse 75   Ht _0  (1.88 m)   Wt 195 lb 6.4 oz (88.6 kg)   SpO2 96%   BMI 25.09 kg/m   Physical Exam:  Well appearing NAD HEENT: Unremarkable Neck:  No JVD, no thyromegally Lymphatics:  No adenopathy Back:  No CVA tenderness Lungs:  Clear with no wheezes HEART:  Regular rate rhythm, no murmurs, no rubs, no clicks Abd:  soft, positive bowel sounds, no organomegally, no rebound, no guarding Ext:  2 plus pulses, no edema, no cyanosis, no clubbing Skin:  No rashes no nodules Neuro:  CN II through XII intact, motor grossly intact  EKG - nsr Orthostatic vitals - HR increased from 65 to 75 and bp decreased from 144 to 110 with minimal symptoms  Assess/Plan: 1. Dizziness - the etiology is unclear. I have discussed the treatment options and recommended watchful waiting. I doubt that his dizziness is related to bradycardia or tachycardia (atrial fib). 2. CAD - he denies anginal symptoms. Continue current meds. 3. PAF - he was out of rhythm 1% of the time. He will remain  on Eliquis.  Mikle Bosworth.D.

## 2019-11-07 ENCOUNTER — Telehealth: Payer: Self-pay

## 2019-11-07 NOTE — Telephone Encounter (Signed)
Yes, ok to get

## 2019-11-07 NOTE — Telephone Encounter (Signed)
Tried calling pt to let him know. No answer and phone kept ringing with no VM set up.

## 2019-11-07 NOTE — Telephone Encounter (Signed)
Copied from [place] [phone] [id number]. Topic: General - Call Back - No Documentation >> Nov 07, 2019 11:44 AM Erick Blinks wrote: Reason for CRM: Pt is scheduled to receive Covid 19 vaccine, needs PCP approval. Please advise  Best contact: [phone]

## 2019-11-08 ENCOUNTER — Encounter: Payer: Self-pay | Admitting: Internal Medicine

## 2019-11-09 NOTE — Telephone Encounter (Signed)
This question was answered via MyChart message.

## 2019-11-13 ENCOUNTER — Other Ambulatory Visit (HOSPITAL_COMMUNITY): Payer: Medicare Other

## 2019-11-13 ENCOUNTER — Encounter (HOSPITAL_COMMUNITY): Payer: Self-pay

## 2019-11-15 NOTE — Progress Notes (Signed)
Subjective:    Patient ID: Fernando Walker, male    DOB: 13-Aug-1933, 86 y.o.   MRN: [id number]  HPI The patient is here for an acute visit.  He started weighing himself 3 weeks ago and was 203 at home.  Yesterday when he weighed himself he was 189.  He feels he has lost about 25 pounds in a few months.  He has noticed this because his pants were a lot looser and he had to go buy new pants and now those pants are looser.  Over the past few months he has felt more sedentary.  He feels like he has been eating the same over the past several months.  He typically cooks at home and eats out maybe 1-2 times a month.  He was the caregiver of his wife who recently was moved to a SNF.  He does not feel depressed.  He was concerned about the weight loss because it did not make sense.  According to our scales here in July he weighed 211, October of 202 and today 195.    He denies other symptoms over the past several months that are concerning.   Medications and allergies reviewed with patient and updated if appropriate.  Patient Active Problem List   Diagnosis Date Noted  . Paroxysmal atrial fibrillation ([place]) 11/01/2019  . Tachycardia-bradycardia syndrome ([place]) 11/01/2019  . Dizziness 11/01/2019  . Caregiver stress 08/06/2019  . Aortic stenosis 07/31/2019  . Paroxysmal tachycardia, unspecified ([place]) 07/31/2019  . Coronary artery disease of native artery of native heart with stable angina pectoris ([place])   . Tick bite of right lower leg 04/27/2019  . Pneumonia   . Hypertension   . GERD (gastroesophageal reflux disease)   . Dyslipidemia   . Carotid arterial disease ([place])   . CAD (coronary artery disease) of artery bypass graft   . Dupuytren's contracture of left hand 07/20/2017  . Skin abnormality 07/20/2017  . 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 Hx of adenomatous colonic polyps 07/27/2011  . Carotid bruit 06/02/2011  . FASTING  HYPERGLYCEMIA 11/10/2009  . Essential hypertension 04/29/2009  . CAD, ARTERY BYPASS GRAFT 04/29/2009  . MYOCARDIAL PERFUSION SCAN, WITH STRESS TEST, ABNORMAL 04/29/2009  . Hyperlipidemia 04/24/2009  . HYPERTROPHY PROSTATE W/UR OBST & OTH LUTS 04/22/2009    Current Outpatient Medications on File Prior to Visit  Medication Sig Dispense Refill  . apixaban (ELIQUIS) 5 MG TABS tablet Take 1 tablet (5 mg total) by mouth 2 (two) times daily. 60 tablet 11  . bimatoprost (LUMIGAN) 0.01 % SOLN Place 1 drop into both eyes at bedtime.    . metoprolol tartrate (LOPRESSOR) 25 MG tablet Take 1 tablet (25 mg total) by mouth daily as needed (palpitations). 90 tablet 0  . nitroGLYCERIN (NITROSTAT) 0.4 MG SL tablet Place 1 tablet (0.4 mg total) under the tongue every 5 (five) minutes as needed for chest pain. 25 tablet 0  . sertraline (ZOLOFT) 25 MG tablet Take 1 tablet (25 mg total) by mouth daily. 90 tablet 3  . simvastatin (ZOCOR) 20 MG tablet TAKE 1 TABLET BY MOUTH AT  BEDTIME 90 tablet 1  . triamcinolone ointment (KENALOG) 0.1 % Apply 1 application topically 2 (two) times daily as needed (rash).    . vitamin B-12 (CYANOCOBALAMIN) 1000 MCG tablet Take 1,000 mcg by mouth every evening.     No current facility-administered medications on file prior to visit.    Past Medical History:  Diagnosis Date  . CAD (coronary artery disease) of artery bypass graft   . Carotid arterial disease (HCC)    Nonobstructive  . Cataract   . Dyslipidemia   . GERD (gastroesophageal reflux disease)   . 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 Hyperlipidemia   . Hypertension   . Hypertrophy of prostate with urinary obstruction and other lower urinary tract symptoms (LUTS)   . Pneumonia    As a child  . Pneumonia    as child    Past Surgical History:  Procedure Laterality Date  . cataracts     both eyes  . COLONOSCOPY    . colonoscopy with polypectomy  2004  . CORONARY ARTERY BYPASS GRAFT  1995   X 7  . FASCIECTOMY Left  11/29/2017   Procedure: SEGMENTAL FASCIECTOMY LEFT SMALL FINGER;  Surgeon: Daryll Brod, MD;  Location: [facility];  Service: Orthopedics;  Laterality: Left;  block and MAC  . LEFT HEART CATH AND CORS/GRAFTS ANGIOGRAPHY N/A 07/25/2019   Procedure: LEFT HEART CATH AND CORS/GRAFTS ANGIOGRAPHY;  Surgeon: Burnell Blanks, MD;  Location: [facility];  Service: Cardiovascular;  Laterality: N/A;  . POLYPECTOMY      Social History   Socioeconomic History  . Marital status: Married    Spouse name: Not on file  . Number of children: 2  . Years of education: Not on file  . Highest education level: Not on file  Occupational History  . Occupation: retired  Tobacco Use  . Smoking status: Former Smoker    Quit date: 10/18/1984    Years since quitting: 35.1  . Smokeless tobacco: Former Network engineer and Sexual Activity  . Alcohol use: Yes    Alcohol/week: 7.0 standard drinks    Types: 7 Glasses of wine per week  . Drug use: No  . Sexual activity: Not Currently  Other Topics Concern  . Not on file  Social History Narrative  . Not on file   Social Determinants of Health   Financial Resource Strain:   . Difficulty of Paying Living Expenses: Not on file  Food Insecurity:   . Worried About Charity fundraiser in the Last Year: Not on file  . Ran Out of Food in the Last Year: Not on file  Transportation Needs:   . Lack of Transportation (Medical): Not on file  . Lack of Transportation (Non-Medical): Not on file  Physical Activity: Unknown  . Days of Exercise per Week: 4 days  . Minutes of Exercise per Session: Not on file  Stress: Stress Concern Present  . Feeling of Stress : Rather much  Social Connections:   . Frequency of Communication with Friends and Family: Not on file  . Frequency of Social Gatherings with Friends and Family: Not on file  . Attends Religious Services: Not on file  . Active Member of Clubs or Organizations: Not on file  . Attends English as a second language teacher Meetings: Not on file  . Marital Status: Not on file    Family History  Problem Relation Age of Onset  . Stroke Father 63  . Diabetes Neg Hx   . Cancer Neg Hx   . GER disease Neg Hx   . Heart attack Neg Hx   . Colon cancer Neg Hx   . Esophageal cancer Neg Hx   . Stomach cancer Neg Hx   . Heart disease Neg Hx     Review of Systems  Constitutional: Negative for appetite change, chills,  diaphoresis, fatigue and fever.  Respiratory: Negative for cough, shortness of breath and wheezing.   Cardiovascular: Negative for chest pain, palpitations and leg swelling.  Gastrointestinal: Negative for abdominal pain, blood in stool, constipation, diarrhea and nausea.  Genitourinary: Positive for frequency (nocturia 2-3 times). Negative for difficulty urinating, dysuria and hematuria.  Musculoskeletal: Positive for back pain (getting better - after lifting wife).  Neurological: Positive for light-headedness (heart related). Negative for headaches.  Psychiatric/Behavioral: Negative for dysphoric mood. The patient is not nervous/anxious.        Objective:   Vitals:   11/16/19 1058  BP: (!) 152/76  Pulse: 60  Resp: 16  Temp: 98.1 F (36.7 C)  SpO2: 98%   BP Readings from Last 3 Encounters:  11/16/19 (!) 152/76  11/01/19 102/68  10/03/19 122/60   Wt Readings from Last 3 Encounters:  11/16/19 195 lb 9.6 oz (88.7 kg)  11/01/19 195 lb 6.4 oz (88.6 kg)  10/03/19 199 lb 3.2 oz (90.4 kg)   Body mass index is 25.11 kg/m.   Physical Exam Constitutional:      General: He is not in acute distress.    Appearance: Normal appearance. He is not ill-appearing, toxic-appearing or diaphoretic.  HENT:     Head: Normocephalic and atraumatic.  Cardiovascular:     Rate and Rhythm: Normal rate and regular rhythm.  Pulmonary:     Effort: Pulmonary effort is normal. No respiratory distress.     Breath sounds: No wheezing or rales.  Abdominal:     General: There is no distension.       Palpations: Abdomen is soft. There is no mass.     Tenderness: There is no abdominal tenderness. There is no guarding.     Hernia: A hernia (Ventral, nontender) is present.  Musculoskeletal:     Cervical back: Neck supple. No rigidity or tenderness.     Right lower leg: No edema.     Left lower leg: No edema.  Lymphadenopathy:     Cervical: No cervical adenopathy.  Skin:    General: Skin is warm and dry.  Neurological:     General: No focal deficit present.     Mental Status: He is alert.  Psychiatric:        Mood and Affect: Mood normal.        Behavior: Behavior normal.        Thought Content: Thought content normal.        Judgment: Judgment normal.            Assessment & Plan:    See Problem List for Assessment and Plan of chronic medical problems.    This visit occurred during the SARS-CoV-2 public health emergency.  Safety protocols were in place, including screening questions prior to the visit, additional usage of staff PPE, and extensive cleaning of exam room while observing appropriate contact time as indicated for disinfecting solutions.

## 2019-11-16 ENCOUNTER — Ambulatory Visit (INDEPENDENT_AMBULATORY_CARE_PROVIDER_SITE_OTHER): Payer: Medicare Other | Admitting: Internal Medicine

## 2019-11-16 ENCOUNTER — Other Ambulatory Visit: Payer: Self-pay

## 2019-11-16 ENCOUNTER — Encounter: Payer: Self-pay | Admitting: Internal Medicine

## 2019-11-16 VITALS — BP 152/76 | HR 60 | Temp 98.1°F | Resp 16 | Ht 74.0 in | Wt 195.6 lb

## 2019-11-16 DIAGNOSIS — Z636 Dependent relative needing care at home: Secondary | ICD-10-CM

## 2019-11-16 DIAGNOSIS — I10 Essential (primary) hypertension: Secondary | ICD-10-CM

## 2019-11-16 DIAGNOSIS — R634 Abnormal weight loss: Secondary | ICD-10-CM | POA: Insufficient documentation

## 2019-11-16 DIAGNOSIS — R7309 Other abnormal glucose: Secondary | ICD-10-CM | POA: Diagnosis not present

## 2019-11-16 LAB — CBC WITH DIFFERENTIAL/PLATELET
Basophils Absolute: 0.1 10*3/uL (ref 0.0–0.1)
Basophils Relative: 0.8 % (ref 0.0–3.0)
Eosinophils Absolute: 0.2 10*3/uL (ref 0.0–0.7)
Eosinophils Relative: 2.6 % (ref 0.0–5.0)
HCT: 44.2 % (ref 39.0–52.0)
Hemoglobin: 14.7 g/dL (ref 13.0–17.0)
Lymphocytes Relative: 12.9 % (ref 12.0–46.0)
Lymphs Abs: 1.2 10*3/uL (ref 0.7–4.0)
MCHC: 33.3 g/dL (ref 30.0–36.0)
MCV: 92 fl (ref 78.0–100.0)
Monocytes Absolute: 0.7 10*3/uL (ref 0.1–1.0)
Monocytes Relative: 8.1 % (ref 3.0–12.0)
Neutro Abs: 7 10*3/uL (ref 1.4–7.7)
Neutrophils Relative %: 75.6 % (ref 43.0–77.0)
Platelets: 337 10*3/uL (ref 150.0–400.0)
RBC: 4.81 Mil/uL (ref 4.22–5.81)
RDW: 13.9 % (ref 11.5–15.5)
WBC: 9.2 10*3/uL (ref 4.0–10.5)

## 2019-11-16 LAB — COMPREHENSIVE METABOLIC PANEL
ALT: 20 U/L (ref 0–53)
AST: 18 U/L (ref 0–37)
Albumin: 4.1 g/dL (ref 3.5–5.2)
Alkaline Phosphatase: 52 U/L (ref 39–117)
BUN: 13 mg/dL (ref 6–23)
CO2: 32 mEq/L (ref 19–32)
Calcium: 9.5 mg/dL (ref 8.4–10.5)
Chloride: 101 mEq/L (ref 96–112)
Creatinine, Ser: 0.94 mg/dL (ref 0.40–1.50)
GFR: 76.32 mL/min (ref >60.00)
Glucose, Bld: 110 mg/dL — ABNORMAL HIGH (ref 70–99)
Potassium: 4.2 mEq/L (ref 3.5–5.1)
Sodium: 137 mEq/L (ref 135–145)
Total Bilirubin: 1.1 mg/dL (ref 0.2–1.2)
Total Protein: 7.3 g/dL (ref 6.0–8.3)

## 2019-11-16 LAB — HEMOGLOBIN A1C: Hgb A1c MFr Bld: 5.9 % (ref 4.6–6.5)

## 2019-11-16 LAB — TSH: TSH: 3.14 u[IU]/mL (ref 0.35–4.50)

## 2019-11-16 NOTE — Patient Instructions (Addendum)
  Blood work was ordered.   ° ° °Medications reviewed and updated.  Changes include :   none ° ° ° °Please followup in 6 months ° ° °

## 2019-11-16 NOTE — Assessment & Plan Note (Signed)
New According to his home scales he has been losing weight, but just started checking his weight 3 weeks ago and is gone from 203 to 189 at home.  He thinks he has lost about 25-30 pounds over the past few months Has been less active and there has been no change in eating so he should not be losing weight.  He has had some increased stress with caring for his wife, but denies depression Unsure of the cause of his weight loss He will continue to monitor at home Check blood work today including CBC, CMP, TSH If his weight loss continues at home he will let me know so that we can undergo further evaluation

## 2019-11-16 NOTE — Assessment & Plan Note (Signed)
Has had increased stress, anxiety and for his wife who is now in a SNF and her estimated prognosis is about 6 months He feels he is doing well Continue sertraline 25 mg daily-can adjust this if needed in the near future

## 2019-11-16 NOTE — Assessment & Plan Note (Signed)
Chronic BP slightly elevated here today, but typically better controlled Current regimen effective and well tolerated Continue current medications at current doses cmp

## 2019-11-16 NOTE — Assessment & Plan Note (Signed)
Will check a1c

## 2019-11-17 ENCOUNTER — Encounter: Payer: Self-pay | Admitting: Internal Medicine

## 2019-11-23 ENCOUNTER — Encounter: Payer: Self-pay | Admitting: Internal Medicine

## 2019-12-04 ENCOUNTER — Telehealth: Payer: Self-pay | Admitting: Internal Medicine

## 2019-12-04 DIAGNOSIS — I10 Essential (primary) hypertension: Secondary | ICD-10-CM

## 2019-12-04 NOTE — Chronic Care Management (AMB) (Signed)
  Chronic Care Management   Note  12/04/2019 Name: Fernando Walker MRN: [id number] DOB: [birth year]  Fernando Walker is a 86 y.o. year old male who is a primary care patient of Burns, Claudina Lick, MD. I reached out to Fernando Walker by phone today in response to a referral sent by [name]'s PCP, Binnie Rail, MD.   [name] was given information about Chronic Care Management services today including:  1. CCM service includes personalized support from designated clinical staff supervised by his physician, including individualized plan of care and coordination with other care providers 2. 24/7 contact phone numbers for assistance for urgent and routine care needs. 3. Service will only be billed when office clinical staff spend 20 minutes or more in a month to coordinate care. 4. Only one practitioner may furnish and bill the service in a calendar month. 5. The patient may stop CCM services at any time (effective at the end of the month) by phone call to the office staff. 6. The patient will be responsible for cost sharing (co-pay) of up to 20% of the service fee (after annual deductible is met).  Patient agreed to services and verbal consent obtained.   Follow up plan:  Raynicia Dukes [employer] Scheduler

## 2019-12-10 NOTE — Chronic Care Management (AMB) (Signed)
Chronic Care Management Pharmacy  Name: Fernando Walker  MRN: [id number] DOB: 03/04/1933   Chief Complaint/ HPI  Fernando Walker,  86 y.o. , male presents for their Initial CCM visit with the clinical pharmacist via telephone due to COVID-19 Pandemic.  PCP : Binnie Rail, MD  Their chronic conditions include: HTN, CAD (CABG 1995), Afib, HLD, BPH, GERD  Pt reports he has been sad and down since wife of 106 years went to live in SNF last month, expected to live 6 months. Pt has been active since retirement, making wine and wood turning, coordinating HS reunions, and belongs to a wine club that meets monthly. Since pandemic and wife's decline he has not been able to do any of these.  Office Visits: 11/16/19 Dr Quay Burow: acute visit for unexplained weight loss. Wife recently moved to SNF. Checked labs, all normal/unchanged from previous. Pt to continue checking weights at home, appears stabilized.  08/06/19 Dr Quay Burow: pt experiencing caregiver stress, started sertraline 25 mg daily.   Consult Visit: 11/01/19 Dr Lovena Le (cardiology EP): pt reports dizziness, etiology unclear, doubt related to Afib. Rec'd watchful waiting. No med changes.   10/03/19 NP (cardiology): No med changes, referred to EP for bradycardia.   08/24/19 Dr Angelena Form (cardiology): Pt returned Holter monitor. HR 39 at night, Afib rates to 180 bpm. Start Eliquis. Use metoprolol prn for palpitations (could not tolerate daily due to dizziness). Referred to EP  Medications: Outpatient Encounter Medications as of 12/11/2019  Medication Sig  . apixaban (ELIQUIS) 5 MG TABS tablet Take 1 tablet (5 mg total) by mouth 2 (two) times daily.  . bimatoprost (LUMIGAN) 0.01 % SOLN Place 1 drop into both eyes at bedtime.  . sertraline (ZOLOFT) 25 MG tablet Take 1 tablet (25 mg total) by mouth daily.  . simvastatin (ZOCOR) 20 MG tablet TAKE 1 TABLET BY MOUTH AT  BEDTIME  . triamcinolone ointment (KENALOG) 0.1 % Apply 1 application  topically 2 (two) times daily as needed (rash).  . vitamin B-12 (CYANOCOBALAMIN) 1000 MCG tablet Take 1,000 mcg by mouth every evening.  . metoprolol tartrate (LOPRESSOR) 25 MG tablet Take 1 tablet (25 mg total) by mouth daily as needed (palpitations). (Patient not taking: Reported on 12/11/2019)  . nitroGLYCERIN (NITROSTAT) 0.4 MG SL tablet Place 1 tablet (0.4 mg total) under the tongue every 5 (five) minutes as needed for chest pain. (Patient not taking: Reported on 12/11/2019)   No facility-administered encounter medications on file as of 12/11/2019.     Current Diagnosis/Assessment:  Goals Addressed            This Visit's Progress   . Pharmacy Care Plan       Current Barriers:  . Chronic Disease Management support, education, and care coordination needs related to Atrial Fibrillation, HTN, and HLD  Pharmacist Clinical Goal(s):  Marland Kitchen Maintain BP between 110/60 and 140/90 . Ensure safety, efficacy, and affordability of medications  Interventions: . Comprehensive medication review performed. . Trial off of sertraline for ~1 month and reassess mood/stress level . Monitor BP and HR a few times a week, and when dizzy  Patient Self Care Activities:  . Self administers medications as prescribed, Calls pharmacy for medication refills, and Calls provider office for new concerns or questions  Initial goal documentation       AFIB/Hypertension   Patient is currently rate controlled. Checks HR at home with pulse ox  Office blood pressures are  BP Readings from Last 3 Encounters:  11/16/19 Marland Kitchen)  152/76  11/01/19 102/68  10/03/19 122/60   Patient checks BP at home infrequently  Patient home BP readings are ranging: SBP 130-140, has not checked in about a month  Patient has failed these meds in past: trandolapril Patient is currently controlled on the following medications: Eliquis 5 mg BID  We discussed: Pt cannot tell when he is in Afib. Pt reports occasional dizziness since  starting Eliquis, thinks may be related to HR. Discussed how BP and HR can cause dizziness, pt agreed to check BP a few times weekly and when dizzy.   Plan  Continue current medications  Monitor BP and HR at home   Hyperlipidemia/CAD   Lipid Panel     Component Value Date/Time   CHOL 142 01/22/2019 0801   TRIG 56 01/22/2019 0801   HDL 64 01/22/2019 0801   CHOLHDL 2.2 01/22/2019 0801   LDLCALC 67 01/22/2019 0801   LABVLDL 11 01/22/2019 0801     Patient has failed these meds in past: n/a Patient is currently controlled on the following medications: simvastatin 20 mg HS, nitroglycerin 0.4 mg SL prn  We discussed:  Pt has been on simvastatin for many years. He was off of it for a 3-year period about 6 years ago when he and wife decided it was no longer beneficial. He started it again at Dr Camillia Herter request and is now taking it consistently.  Stopped for 3 years  Plan  Continue current medications    Depression/anxiety   Patient has failed these meds in past: hydroxyzine Patient is currently controlled on the following medications: sertraline 25 mg daily   We discussed:  Pt feels down due to wife's poor prognosis, and not doing his usual activities/hobbies. He thinks sertraline may be making things worse. Offered option of doing a trial off of sertraline for a month and to reassess mood/symptoms. If symptoms improve, stay off sertraline. If symptoms remain or worsen, consider dose escalation.  Plan  Stop sertraline for 1 month Reassess symptoms  Health Maintenance   Patient is currently controlled on the following medications: vitamin B12, Lumigan 0.01% eye drops  We discussed:  Pt doing well, denies issues  Plan  Continue current medications    Medication Management   Pt uses [facility] for Eliquis, all others from [facility] mail Uses 2 pill boxes - AM and PM boxes Pt endorses 100% compliance  We discussed: Pt uses mail order for all meds at no cost, except he  found Eliquis is cheaper at [facility]. Eliquis cost is currently reasonable, discussed coverage gap so patient is aware if med suddenly increased in price.  Plan  Continue current medications  Reassess Eliquis cost prior to coverage gap   Follow up: 1 month phone visit   Charlene Brooke, PharmD Clinical Pharmacist [facility] [phone]

## 2019-12-11 ENCOUNTER — Other Ambulatory Visit: Payer: Self-pay

## 2019-12-11 ENCOUNTER — Ambulatory Visit: Payer: Medicare Other | Admitting: Pharmacist

## 2019-12-11 DIAGNOSIS — I480 Paroxysmal atrial fibrillation: Secondary | ICD-10-CM

## 2019-12-11 DIAGNOSIS — Z636 Dependent relative needing care at home: Secondary | ICD-10-CM

## 2019-12-11 DIAGNOSIS — I25118 Atherosclerotic heart disease of native coronary artery with other forms of angina pectoris: Secondary | ICD-10-CM

## 2019-12-11 DIAGNOSIS — I10 Essential (primary) hypertension: Secondary | ICD-10-CM

## 2019-12-11 NOTE — Patient Instructions (Addendum)
Visit Information  Thank you for meeting with me to discuss your medications! I look forward to working with you to achieve your health care goals. Below is a summary of what we talked about during the visit:  Goals Addressed            This Visit's Progress   . Pharmacy Care Plan       Current Barriers:  . Chronic Disease Management support, education, and care coordination needs related to Atrial Fibrillation, HTN, and HLD  Pharmacist Clinical Goal(s):  Marland Kitchen Maintain BP between 110/60 and 140/90 . Ensure safety, efficacy, and affordability of medications  Interventions: . Comprehensive medication review performed. . Trial off of sertraline for ~1 month and reassess mood/stress level . Monitor BP and HR a few times a week, and when dizzy  Patient Self Care Activities:  . Self administers medications as prescribed, Calls pharmacy for medication refills, and Calls provider office for new concerns or questions  Initial goal documentation       Fernando Walker was given information about Chronic Care Management services today including:  1. CCM service includes personalized support from designated clinical staff supervised by his physician, including individualized plan of care and coordination with other care providers 2. 24/7 contact phone numbers for assistance for urgent and routine care needs. 3. Service will only be billed when office clinical staff spend 20 minutes or more in a month to coordinate care. 4. Only one practitioner may furnish and bill the service in a calendar month. 5. The patient may stop CCM services at any time (effective at the end of the month) by phone call to the office staff. 6. The patient will be responsible for cost sharing (co-pay) of up to 20% of the service fee (after annual deductible is met).  Patient agreed to services and verbal consent obtained.   The patient verbalized understanding of instructions provided today and agreed to receive a  mailed copy of patient instruction and/or educational materials. Telephone follow up appointment with pharmacy team member scheduled for: 01/15/20 @ 11:30am   Charlene Brooke, PharmD Clinical Pharmacist [facility] [phone]   Atrial Fibrillation  Atrial fibrillation is a type of heartbeat that is irregular or fast. If you have this condition, your heart beats without any order. This makes it hard for your heart to pump blood in a normal way. Atrial fibrillation may come and go, or it may become a long-lasting problem. If this condition is not treated, it can put you at higher risk for stroke, heart failure, and other heart problems. What are the causes? This condition may be caused by diseases that damage the heart. They include:  High blood pressure.  Heart failure.  Heart valve disease.  Heart surgery. Other causes include:  Diabetes.  Thyroid disease.  Being overweight.  Kidney disease. Sometimes the cause is not known. What increases the risk? You are more likely to develop this condition if:  You are older.  You smoke.  You exercise often and very hard.  You have a family history of this condition.  You are a man.  You use drugs.  You drink a lot of alcohol.  You have lung conditions, such as emphysema, pneumonia, or COPD.  You have sleep apnea. What are the signs or symptoms? Common symptoms of this condition include:  A feeling that your heart is beating very fast.  Chest pain or discomfort.  Feeling short of breath.  Suddenly feeling light-headed or  weak.  Getting tired easily during activity.  Fainting.  Sweating. In some cases, there are no symptoms. How is this treated? Treatment for this condition depends on underlying conditions and how you feel when you have atrial fibrillation. They include:  Medicines to: ? Prevent blood clots. ? Treat heart rate or heart rhythm problems.  Using devices, such  as a pacemaker, to correct heart rhythm problems.  Doing surgery to remove the part of the heart that sends bad signals.  Closing an area where clots can form in the heart (left atrial appendage). In some cases, your doctor will treat other underlying conditions. Follow these instructions at home: Medicines  Take over-the-counter and prescription medicines only as told by your doctor.  Do not take any new medicines without first talking to your doctor.  If you are taking blood thinners: ? Talk with your doctor before you take any medicines that have aspirin or NSAIDs, such as ibuprofen, in them. ? Take your medicine exactly as told by your doctor. Take it at the same time each day. ? Avoid activities that could hurt or bruise you. Follow instructions about how to prevent falls. ? Wear a bracelet that says you are taking blood thinners. Or, carry a card that lists what medicines you take. Lifestyle      Do not use any products that have nicotine or tobacco in them. These include cigarettes, e-cigarettes, and chewing tobacco. If you need help quitting, ask your doctor.  Eat heart-healthy foods. Talk with your doctor about the right eating plan for you.  Exercise regularly as told by your doctor.  Do not drink alcohol.  Lose weight if you are overweight.  Do not use drugs, including cannabis. General instructions  If you have a condition that causes breathing to stop for a short period of time (apnea), treat it as told by your doctor.  Keep a healthy weight. Do not use diet pills unless your doctor says they are safe for you. Diet pills may make heart problems worse.  Keep all follow-up visits as told by your doctor. This is important. Contact a doctor if:  You notice a change in the speed, rhythm, or strength of your heartbeat.  You are taking a blood-thinning medicine and you get more bruising.  You get tired more easily when you move or exercise.  You have a sudden  change in weight. Get help right away if:   You have pain in your chest or your belly (abdomen).  You have trouble breathing.  You have side effects of blood thinners, such as blood in your vomit, poop (stool), or pee (urine), or bleeding that cannot stop.  You have any signs of a stroke. "BE FAST" is an easy way to remember the main warning signs: ? B - Balance. Signs are dizziness, sudden trouble walking, or loss of balance. ? E - Eyes. Signs are trouble seeing or a change in how you see. ? F - Face. Signs are sudden weakness or loss of feeling in the face, or the face or eyelid drooping on one side. ? A - Arms. Signs are weakness or loss of feeling in an arm. This happens suddenly and usually on one side of the body. ? S - Speech. Signs are sudden trouble speaking, slurred speech, or trouble understanding what people say. ? T - Time. Time to call emergency services. Write down what time symptoms started.  You have other signs of a stroke, such as: ? A sudden,  very bad headache with no known cause. ? Feeling like you may vomit (nausea). ? Vomiting. ? A seizure. These symptoms may be an emergency. Do not wait to see if the symptoms will go away. Get medical help right away. Call your local emergency services (911 in the [place] Do not drive yourself to the hospital. Summary  Atrial fibrillation is a type of heartbeat that is irregular or fast.  You are at higher risk of this condition if you smoke, are older, have diabetes, or are overweight.  Follow your doctor's instructions about medicines, diet, exercise, and follow-up visits.  Get help right away if you have signs or symptoms of a stroke.  Get help right away if you cannot catch your breath, or you have chest pain or discomfort. This information is not intended to replace advice given to you by your health care provider. Make sure you discuss any questions you have with your health care provider. Document Revised: 03/28/2019  Document Reviewed: 03/28/2019 Elsevier Patient Education  [place].

## 2019-12-11 NOTE — Progress Notes (Signed)
.   I have reviewed this encounter including the documentation in this note and/or discussed this patient with the care management provider. I am certifying that I agree with the content of this note as supervising physician.  Binnie Rail, MD   12/11/2019

## 2019-12-12 NOTE — Addendum Note (Signed)
Addended by: Karle Barr on: 12/12/2019 09:05 PM   Modules accepted: Orders

## 2019-12-29 ENCOUNTER — Other Ambulatory Visit: Payer: Self-pay | Admitting: Cardiovascular Disease

## 2020-01-02 ENCOUNTER — Ambulatory Visit (INDEPENDENT_AMBULATORY_CARE_PROVIDER_SITE_OTHER): Payer: Medicare Other | Admitting: Cardiovascular Disease

## 2020-01-02 ENCOUNTER — Encounter: Payer: Self-pay | Admitting: Cardiovascular Disease

## 2020-01-02 ENCOUNTER — Other Ambulatory Visit: Payer: Self-pay

## 2020-01-02 VITALS — BP 144/60 | HR 65 | Ht 74.0 in | Wt 198.0 lb

## 2020-01-02 DIAGNOSIS — E785 Hyperlipidemia, unspecified: Secondary | ICD-10-CM

## 2020-01-02 DIAGNOSIS — I350 Nonrheumatic aortic (valve) stenosis: Secondary | ICD-10-CM | POA: Diagnosis not present

## 2020-01-02 DIAGNOSIS — I10 Essential (primary) hypertension: Secondary | ICD-10-CM | POA: Diagnosis not present

## 2020-01-02 DIAGNOSIS — I2510 Atherosclerotic heart disease of native coronary artery without angina pectoris: Secondary | ICD-10-CM

## 2020-01-02 DIAGNOSIS — I480 Paroxysmal atrial fibrillation: Secondary | ICD-10-CM

## 2020-01-02 NOTE — Patient Instructions (Signed)

## 2020-01-02 NOTE — Progress Notes (Signed)
Chief Complaint  Patient presents with  . Follow-up  . Coronary Artery Disease   History of Present Illness: 86 yo male with history of CAD, atrial fibrillation, HTN and HLD here today for cardiac follow up. He underwent 7V CABG in 1995. Carotid artery dopplers November 2017 with mild bilateral disease. Echo January 2019 with normal LV function, LVEF=65-70%,  mild AS (mean gradient 13 mmHg). He was seen in our office September 2020 by Ermalinda Barrios, PA and c/o chest tightness. Cardiac cath 10/720 with 6/7 patent bypass grafts. The distal limb of the SVG to the RV marginal was chronically occluded. Echo 07/20/19 with normal LVEF and mild AS. Cardiac monitor October 2020 and showed atrial fibrillation with longest episode 1 hour.   He is here today for follow up. The patient denies any chest pain, dyspnea, palpitations, lower extremity edema, orthopnea, PND, dizziness, near syncope or syncope.    Primary Care Physician: Binnie Rail, MD  Past Medical History:  Diagnosis Date  . CAD (coronary artery disease) of artery bypass graft   . Carotid arterial disease (HCC)    Nonobstructive  . Cataract   . Dyslipidemia   . GERD (gastroesophageal reflux disease)   . 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 Hyperlipidemia   . Hypertension   . Hypertrophy of prostate with urinary obstruction and other lower urinary tract symptoms (LUTS)   . Pneumonia    As a child  . Pneumonia    as child    Past Surgical History:  Procedure Laterality Date  . cataracts     both eyes  . COLONOSCOPY    . colonoscopy with polypectomy  2004  . CORONARY ARTERY BYPASS GRAFT  1995   X 7  . FASCIECTOMY Left 11/29/2017   Procedure: SEGMENTAL FASCIECTOMY LEFT SMALL FINGER;  Surgeon: Daryll Brod, MD;  Location: [facility];  Service: Orthopedics;  Laterality: Left;  block and MAC  . LEFT HEART CATH AND CORS/GRAFTS ANGIOGRAPHY N/A 07/25/2019   Procedure: LEFT HEART CATH AND CORS/GRAFTS ANGIOGRAPHY;  Surgeon:  Burnell Blanks, MD;  Location: [facility];  Service: Cardiovascular;  Laterality: N/A;  . POLYPECTOMY      Current Outpatient Medications  Medication Sig Dispense Refill  . apixaban (ELIQUIS) 5 MG TABS tablet Take 1 tablet (5 mg total) by mouth 2 (two) times daily. 60 tablet 11  . bimatoprost (LUMIGAN) 0.01 % SOLN Place 1 drop into both eyes at bedtime.    . sertraline (ZOLOFT) 25 MG tablet Take 1 tablet (25 mg total) by mouth daily. 90 tablet 3  . simvastatin (ZOCOR) 20 MG tablet TAKE 1 TABLET BY MOUTH AT  BEDTIME 90 tablet 3  . triamcinolone ointment (KENALOG) 0.1 % Apply 1 application topically 2 (two) times daily as needed (rash).    . vitamin B-12 (CYANOCOBALAMIN) 1000 MCG tablet Take 1,000 mcg by mouth every evening.     No current facility-administered medications for this visit.    Allergies  Allergen Reactions  . Sulfonamide Derivatives Rash    Social History   Socioeconomic History  . Marital status: Married    Spouse name: Not on file  . Number of children: 2  . Years of education: Not on file  . Highest education level: Not on file  Occupational History  . Occupation: retired  Tobacco Use  . Smoking status: Former Smoker    Quit date: 10/18/1984    Years since quitting: 35.2  . Smokeless tobacco: Former Network engineer  and Sexual Activity  . Alcohol use: Yes    Alcohol/week: 7.0 standard drinks    Types: 7 Glasses of wine per week  . Drug use: No  . Sexual activity: Not Currently  Other Topics Concern  . Not on file  Social History Narrative  . Not on file   Social Determinants of Health   Financial Resource Strain:   . Difficulty of Paying Living Expenses:   Food Insecurity:   . Worried About Charity fundraiser in the Last Year:   . Arboriculturist in the Last Year:   Transportation Needs:   . Film/video editor (Medical):   Marland Kitchen Lack of Transportation (Non-Medical):   Physical Activity: Unknown  . Days of Exercise per Week: 4  days  . Minutes of Exercise per Session: Not on file  Stress: Stress Concern Present  . Feeling of Stress : Rather much  Social Connections:   . Frequency of Communication with Friends and Family:   . Frequency of Social Gatherings with Friends and Family:   . Attends Religious Services:   . Active Member of Clubs or Organizations:   . Attends Archivist Meetings:   Marland Kitchen Marital Status:   Intimate Partner Violence:   . Fear of Current or Ex-Partner:   . Emotionally Abused:   Marland Kitchen Physically Abused:   . Sexually Abused:     Family History  Problem Relation Age of Onset  . Stroke Father 38  . Diabetes Neg Hx   . Cancer Neg Hx   . GER disease Neg Hx   . Heart attack Neg Hx   . Colon cancer Neg Hx   . Esophageal cancer Neg Hx   . Stomach cancer Neg Hx   . Heart disease Neg Hx     Review of Systems:  As stated in the HPI and otherwise negative.   BP (!) 144/60   Pulse 65   Ht _0  (1.88 m)   Wt 198 lb (89.8 kg)   SpO2 97%   BMI 25.42 kg/m   Physical Examination:  General: Well developed, well nourished, NAD  HEENT: OP clear, mucus membranes moist  SKIN: warm, dry. No rashes. Neuro: No focal deficits  Musculoskeletal: Muscle strength 5/5 all ext  Psychiatric: Mood and affect normal  Neck: No JVD, no carotid bruits, no thyromegaly, no lymphadenopathy.  Lungs:Clear bilaterally, no wheezes, rhonci, crackles Cardiovascular: Regular rate and rhythm. No murmurs, gallops or rubs. Abdomen:Soft. Bowel sounds present. Non-tender.  Extremities: No lower extremity edema. Pulses are 2 + in the bilateral DP/PT.  Echo October 2020:  1. Left ventricular ejection fraction, by visual estimation, is 60 to 65%. The left ventricle has normal function. Mildly increased left ventricular size. There is no left ventricular hypertrophy.  2. Left ventricular diastolic Doppler parameters are consistent with impaired relaxation pattern of LV diastolic filling.  3. Global right ventricle  has normal systolic function.The right ventricular size is normal. No increase in right ventricular wall thickness.  4. Left atrial size was normal.  5. Right atrial size was normal.  6. The mitral valve is normal in structure. No evidence of mitral valve regurgitation.  7. The tricuspid valve is normal in structure. Tricuspid valve regurgitation is trivial.  8. The aortic valve is tricuspid Aortic valve regurgitation was not visualized by color flow Doppler. Mild aortic valve stenosis.  9. The pulmonic valve was normal in structure. Pulmonic valve regurgitation is not visualized by color flow Doppler. 10.  Mildly elevated pulmonary artery systolic pressure. 11. The inferior vena cava is normal in size with greater than 50% respiratory variability, suggesting right atrial pressure of 3 mmHg.  Cardiac cath   1. Severe triple vessel CAD with chronic occlusion of the distal left main artery and the proximal RCA s/p 7V CABG with 6/7 patent bypass grafts 2. Patent LIMA to LAD 3. Patent sequential SVG to Diagonal 1 and Diagonal 2.  4. Patent sequential SVG to obtuse marginal 1 and obtuse marginal 2 5. Patent SVG to RV marginal branch with chronic occlusion of the distal sequential limb of the graft to the PDA.   Diagnostic Dominance: Right  Intervention    EKG:  EKG is  not ordered today. The ekg ordered today demonstrates   Recent Labs: 11/16/2019: ALT 20; BUN 13; Creatinine, Ser 0.94; Hemoglobin 14.7; Platelets 337.0; Potassium 4.2; Sodium 137; TSH 3.14   Lipid Panel    Component Value Date/Time   CHOL 142 01/22/2019 0801   TRIG 56 01/22/2019 0801   HDL 64 01/22/2019 0801   CHOLHDL 2.2 01/22/2019 0801   CHOLHDL 2 08/27/2016 1007   VLDL 11.6 08/27/2016 1007   LDLCALC 67 01/22/2019 0801     Wt Readings from Last 3 Encounters:  01/02/20 198 lb (89.8 kg)  11/16/19 195 lb 9.6 oz (88.7 kg)  11/01/19 195 lb 6.4 oz (88.6 kg)     Other studies Reviewed: Additional studies/ records  that were reviewed today include: . Review of the above records demonstrates:   Assessment and Plan:   1. CAD s/p CABG without angina: No chest pain. Cardiac cath October 2020 with stable CAD. Will continue ASA and statin.        2. HTN: BP is well controlled.   3. Hyperlipidemia: LDL at goal in 2020. Continue statin  4. Carotid artery disease: He has mild bilateral carotid artery disease by dopplers in November 2017. No plans to repeat given age and stability of disease over time.   5. Aortic stenosis: Mild by echo October 2020.  Repeat echo in October 2022  6. Atrial fibrillation, paroxysmal: Sinus today. CHADS VASC score is 4. He was seen by EP in January 2021. Plan to continue Eliquis. He stopped his metoprolol due to low blood pressures.   Current medicines are reviewed at length with the patient today.  The patient does not have concerns regarding medicines.  The following changes have been made:  no change  Labs/ tests ordered today include:   No orders of the defined types were placed in this encounter.   Disposition: Follow up with me in 12 months.   Signed, Lauree Chandler, MD 01/02/2020 2:50 PM    [facility] [place], [place], [place]  [address] Phone: [phone]; Fax: [phone]

## 2020-01-08 ENCOUNTER — Telehealth: Payer: Medicare Other

## 2020-01-14 NOTE — Chronic Care Management (AMB) (Signed)
Chronic Care Management Pharmacy  Name: Fernando Walker  MRN: [id number] DOB: 19-Jun-1933   Chief Complaint/ HPI  Fernando Walker,  86 y.o. , male presents for their Follow-Up CCM visit with the clinical pharmacist via telephone due to COVID-19 Pandemic.  PCP : Binnie Rail, MD  Their chronic conditions include: HTN, CAD (CABG 1995), Afib, HLD, BPH, GERD  Pt reports his wife of 28 years just passed away a few days ago. He is grieving but his family and friends have been visiting him and getting him through.   Office Visits: 11/16/19 Dr Quay Burow: acute visit for unexplained weight loss. Wife recently moved to SNF. Checked labs, all normal/unchanged from previous. Pt to continue checking weights at home, appears stabilized.  08/06/19 Dr Quay Burow: pt experiencing caregiver stress, started sertraline 25 mg daily.   Consult Visit: 01/02/20 Dr Angelena Form (cardiology): pt stable, no med changes.  11/01/19 Dr Lovena Le (cardiology EP): pt reports dizziness, etiology unclear, doubt related to Afib. Rec'd watchful waiting. No med changes.   10/03/19 NP (cardiology): No med changes, referred to EP for bradycardia.   08/24/19 Dr Angelena Form (cardiology): Pt returned Holter monitor. HR 39 at night, Afib rates to 180 bpm. Start Eliquis. Use metoprolol prn for palpitations (could not tolerate daily due to dizziness). Referred to EP  Medications: Outpatient Encounter Medications as of 01/15/2020  Medication Sig  . apixaban (ELIQUIS) 5 MG TABS tablet Take 1 tablet (5 mg total) by mouth 2 (two) times daily.  . bimatoprost (LUMIGAN) 0.01 % SOLN Place 1 drop into both eyes at bedtime.  . sertraline (ZOLOFT) 25 MG tablet Take 1 tablet (25 mg total) by mouth daily.  . simvastatin (ZOCOR) 20 MG tablet TAKE 1 TABLET BY MOUTH AT  BEDTIME  . triamcinolone ointment (KENALOG) 0.1 % Apply 1 application topically 2 (two) times daily as needed (rash).  . vitamin B-12 (CYANOCOBALAMIN) 1000 MCG tablet Take 1,000 mcg by  mouth every evening.   No facility-administered encounter medications on file as of 01/15/2020.     Current Diagnosis/Assessment: SDOH Interventions     Most Recent Value  SDOH Interventions  SDOH Interventions for the Following Domains  Depression  Depression Interventions/Treatment   Currently on Treatment      Goals Addressed            This Visit's Progress   . Pharmacy Care Plan   On track    Current Barriers:  . Chronic Disease Management support, education, and care coordination needs related to Atrial Fibrillation, HTN, and HLD  Pharmacist Clinical Goal(s):  Marland Kitchen Maintain BP between 120/60 and 140/90 . Ensure safety, efficacy, and affordability of medications  Interventions: . Comprehensive medication review performed. . Continue sertraline for now, may re-attempt trial off in the future if desired . Monitor BP and HR a few times a week, and when dizzy o Drink plenty of water to prevent orthostasis (sudden BP drop when standing)  Patient Self Care Activities:  . Self administers medications as prescribed, Calls pharmacy for medication refills, and Calls provider office for new concerns or questions  Please see past updates related to this goal by clicking on the "Past Updates" button in the selected goal        AFIB/Hypertension   Patient is currently rate controlled. Checks HR at home with pulse ox  Office blood pressures are  BP Readings from Last 3 Encounters:  11/16/19 (!) 152/76  11/01/19 102/68  10/03/19 122/60   Patient checks BP at home  infrequently  Patient home BP readings are ranging:  SBP 135-145, HR 60s. Lowest BP 110 - feels very dizzy. He has tried to check BP when he feels dizzy and it is always in 110s. He noticed this happens most often after he stands up. On one occasion, he checked BP when he was feeling very good and relaxed and SBP was 145, he stood up to walk and felt dizzy, rechecked BP and SBP was 112.  Patient has failed these  meds in past: trandolapril Patient is currently controlled on the following medications: Eliquis 5 mg BID  We discussed: Pt describes BP dropping > 20 mmHg after standing. Discussed orthostatic hypotension. None of his medications would cause this. Discussed importance of hydration and good oral intake.    Plan  Continue current medications  Monitor BP and HR at home Increase water intake   Hyperlipidemia/CAD   Lipid Panel     Component Value Date/Time   CHOL 142 01/22/2019 0801   TRIG 56 01/22/2019 0801   HDL 64 01/22/2019 0801   CHOLHDL 2.2 01/22/2019 0801   LDLCALC 67 01/22/2019 0801   LABVLDL 11 01/22/2019 0801     Patient has failed these meds in past: n/a Patient is currently controlled on the following medications: simvastatin 20 mg HS, nitroglycerin 0.4 mg SL prn  We discussed:  Pt has been on simvastatin for many years. He was off of it for a 3-year period about 6 years ago when he and wife decided it was no longer beneficial. He started it again at Dr Camillia Herter request and is now taking it consistently.   Plan  Continue current medications    Depression/anxiety   Patient has failed these meds in past: hydroxyzine Patient is currently controlled on the following medications: sertraline 25 mg daily   We discussed: Pt was off sertraline for a week or so and felt worse. He has been back on 25 mg/day through his wife's passing and would like to continue. Discussed possibly increasing the dose, pt would rather stay on current dose and he does want to try another trial off when he is feeling better.  Plan  Continue current medications   Health Maintenance   Patient is currently controlled on the following medications: vitamin B12, Lumigan 0.01% eye drops  We discussed:  Pt doing well, denies issues  Plan  Continue current medications    Medication Management   Pt uses [facility] for Eliquis, all others from [facility] mail Uses 2 pill boxes - AM and PM  boxes Pt endorses 100% compliance  We discussed: Pt uses mail order for all meds at no cost, except he found Eliquis is cheaper at [facility]. Eliquis cost is currently reasonable, discussed coverage gap so patient is aware if med suddenly increased in price.  Plan  Continue current medications  Reassess Eliquis cost prior to coverage gap   Follow up: 1 month phone visit  Charlene Brooke, PharmD Clinical Pharmacist [facility] at [facility] [phone]

## 2020-01-15 ENCOUNTER — Ambulatory Visit: Payer: Medicare Other | Admitting: Pharmacist

## 2020-01-15 ENCOUNTER — Other Ambulatory Visit: Payer: Self-pay

## 2020-01-15 DIAGNOSIS — I480 Paroxysmal atrial fibrillation: Secondary | ICD-10-CM

## 2020-01-15 DIAGNOSIS — Z636 Dependent relative needing care at home: Secondary | ICD-10-CM

## 2020-01-15 DIAGNOSIS — I10 Essential (primary) hypertension: Secondary | ICD-10-CM

## 2020-01-15 NOTE — Patient Instructions (Signed)
Visit Information  Goals Addressed            This Visit's Progress   . Pharmacy Care Plan   On track    Current Barriers:  . Chronic Disease Management support, education, and care coordination needs related to Atrial Fibrillation, HTN, and HLD  Pharmacist Clinical Goal(s):  Marland Kitchen Maintain BP between 120/60 and 140/90 . Ensure safety, efficacy, and affordability of medications  Interventions: . Comprehensive medication review performed. . Continue sertraline for now, may re-attempt trial off in the future if desired . Monitor BP and HR a few times a week, and when dizzy o Drink plenty of water to prevent orthostasis (sudden BP drop when standing)  Patient Self Care Activities:  . Self administers medications as prescribed, Calls pharmacy for medication refills, and Calls provider office for new concerns or questions  Please see past updates related to this goal by clicking on the "Past Updates" button in the selected goal        The patient verbalized understanding of instructions provided today and declined a print copy of patient instruction materials.   Telephone follow up appointment with pharmacy team member scheduled for: 1 month  Charlene Brooke, PharmD Clinical Pharmacist [facility] [phone]   Orthostatic Hypotension Blood pressure is a measurement of how strongly, or weakly, your blood is pressing against the walls of your arteries. Orthostatic hypotension is a sudden drop in blood pressure that happens when you quickly change positions, such as when you get up from sitting or lying down. Arteries are blood vessels that carry blood from your heart throughout your body. When blood pressure is too low, you may not get enough blood to your brain or to the rest of your organs. This can cause weakness, light-headedness, rapid heartbeat, and fainting. This can last for just a few seconds or for up to a few minutes. Orthostatic hypotension is  usually not a serious problem. However, if it happens frequently or gets worse, it may be a sign of something more serious. What are the causes? This condition may be caused by:  Sudden changes in posture, such as standing up quickly after you have been sitting or lying down.  Blood loss.  Loss of body fluids (dehydration).  Heart problems.  Hormone (endocrine) problems.  Pregnancy.  Severe infection.  Lack of certain nutrients.  Severe allergic reactions (anaphylaxis).  Certain medicines, such as blood pressure medicine or medicines that make the body lose excess fluids (diuretics). Sometimes, this condition can be caused by not taking medicine as directed, such as taking too much of a certain medicine. What increases the risk? The following factors may make you more likely to develop this condition:  Age. Risk increases as you get older.  Conditions that affect the heart or the central nervous system.  Taking certain medicines, such as blood pressure medicine or diuretics.  Being pregnant. What are the signs or symptoms? Symptoms of this condition may include:  Weakness.  Light-headedness.  Dizziness.  Blurred vision.  Fatigue.  Rapid heartbeat.  Fainting, in severe cases. How is this diagnosed? This condition is diagnosed based on:  Your medical history.  Your symptoms.  Your blood pressure measurement. Your health care provider will check your blood pressure when you are: ? Lying down. ? Sitting. ? Standing. A blood pressure reading is recorded as two numbers, such as "120 over 80" (or 120/80). The first ("top") number is called the systolic pressure. It is a measure  of the pressure in your arteries as your heart beats. The second ("bottom") number is called the diastolic pressure. It is a measure of the pressure in your arteries when your heart relaxes between beats. Blood pressure is measured in a unit called mm Hg. Healthy blood pressure for most  adults is 120/80. If your blood pressure is below 90/60, you may be diagnosed with hypotension. Other information or tests that may be used to diagnose orthostatic hypotension include:  Your other vital signs, such as your heart rate and temperature.  Blood tests.  Tilt table test. For this test, you will be safely secured to a table that moves you from a lying position to an upright position. Your heart rhythm and blood pressure will be monitored during the test. How is this treated? This condition may be treated by:  Changing your diet. This may involve eating more salt (sodium) or drinking more water.  Taking medicines to raise your blood pressure.  Changing the dosage of certain medicines you are taking that might be lowering your blood pressure.  Wearing compression stockings. These stockings help to prevent blood clots and reduce swelling in your legs. In some cases, you may need to go to the hospital for:  Fluid replacement. This means you will receive fluids through an IV.  Blood replacement. This means you will receive donated blood through an IV (transfusion).  Treating an infection or heart problems, if this applies.  Monitoring. You may need to be monitored while medicines that you are taking wear off. Follow these instructions at home: Eating and drinking   Drink enough fluid to keep your urine pale yellow.  Eat a healthy diet, and follow instructions from your health care provider about eating or drinking restrictions. A healthy diet includes: ? Fresh fruits and vegetables. ? Whole grains. ? Lean meats. ? Low-fat dairy products.  Eat extra salt only as directed. Do not add extra salt to your diet unless your health care provider told you to do that.  Eat frequent, small meals.  Avoid standing up suddenly after eating. Medicines  Take over-the-counter and prescription medicines only as told by your health care provider. ? Follow instructions from your  health care provider about changing the dosage of your current medicines, if this applies. ? Do not stop or adjust any of your medicines on your own. General instructions   Wear compression stockings as told by your health care provider.  Get up slowly from lying down or sitting positions. This gives your blood pressure a chance to adjust.  Avoid hot showers and excessive heat as directed by your health care provider.  Return to your normal activities as told by your health care provider. Ask your health care provider what activities are safe for you.  Do not use any products that contain nicotine or tobacco, such as cigarettes, e-cigarettes, and chewing tobacco. If you need help quitting, ask your health care provider.  Keep all follow-up visits as told by your health care provider. This is important. Contact a health care provider if you:  Vomit.  Have diarrhea.  Have a fever for more than 2-3 days.  Feel more thirsty than usual.  Feel weak and tired. Get help right away if you:  Have chest pain.  Have a fast or irregular heartbeat.  Develop numbness in any part of your body.  Cannot move your arms or your legs.  Have trouble speaking.  Become sweaty or feel light-headed.  Faint.  Feel short  of breath.  Have trouble staying awake.  Feel confused. Summary  Orthostatic hypotension is a sudden drop in blood pressure that happens when you quickly change positions.  Orthostatic hypotension is usually not a serious problem.  It is diagnosed by having your blood pressure taken lying down, sitting, and then standing.  It may be treated by changing your diet or adjusting your medicines. This information is not intended to replace advice given to you by your health care provider. Make sure you discuss any questions you have with your health care provider. Document Revised: 03/30/2018 Document Reviewed: 03/30/2018 Elsevier Patient Education  [place].

## 2020-02-19 ENCOUNTER — Other Ambulatory Visit: Payer: Self-pay

## 2020-02-19 ENCOUNTER — Ambulatory Visit: Payer: Medicare Other | Admitting: Pharmacist

## 2020-02-19 DIAGNOSIS — I25118 Atherosclerotic heart disease of native coronary artery with other forms of angina pectoris: Secondary | ICD-10-CM

## 2020-02-19 DIAGNOSIS — I480 Paroxysmal atrial fibrillation: Secondary | ICD-10-CM

## 2020-02-19 DIAGNOSIS — Z636 Dependent relative needing care at home: Secondary | ICD-10-CM

## 2020-02-19 DIAGNOSIS — I10 Essential (primary) hypertension: Secondary | ICD-10-CM

## 2020-02-19 NOTE — Patient Instructions (Addendum)
Visit Information  Goals Addressed            This Visit's Progress   . Pharmacy Care Plan       CARE PLAN ENTRY  Current Barriers:  . Chronic Disease Management support, education, and care coordination needs related to Hypertension, Hyperlipidemia, Atrial Fibrillation, and Depression/Grief   Hypertension - Blood Pressure . Pharmacist Clinical Goal(s): o Over the next 30 days, patient will work with PharmD and providers to maintain BP goal 110/60 - 140/90 . Current regimen:  o No medications . Interventions: o Discussed possibility of orthostatic hypotension - BP drops after standing, causing dizziness/lightheadedness o Discussed importance of hydration and adequate nutrition to maintain BP . Patient self care activities - Over the next 30 days, patient will: o Check BP 1-2x weekly, document, and provide at future appointments o Ensure daily salt intake < 2300 mg/day o Maintain adequate fluid intake to keep BP stable  Hyperlipidemia/Heart Disease . Pharmacist Clinical Goal(s): o Over the next 30 days, patient will work with PharmD and providers to maintain LDL goal < 70 . Current regimen:  o Simvastatin 20 mg at bedtime o Nitroglycerin 0.4 mg as needed . Interventions: o Discussed benefits of statin for maintaining cholesterol at goal and preventing heart attack/stroke . Patient self care activities - Over the next 30 days, patient will: o Take medication as prescribed  Atrial Fibrillation . Pharmacist Clinical Goal(s) o Over the next 30 days, patient will work with PharmD and providers to optimize anticoagulation therapy . Current regimen:  o Eliquis 5 mg BID . Interventions: o Discussed benefits of Eliquis for stroke prevention o Discussed bleeding risk with Eliquis and drug interactions with OTC pain meds o Discussed cost of Eliquis is currently reasonable . Patient self care activities - Over the next 30 days, patient will: o Monitor for signs and symptoms of  bleeding o Avoid OTC NSAIDs for pain (ibuprofen, naproxen)  Stress/Grief . Pharmacist Clinical Goal(s) o Over the next 30 days, patient will work with PharmD and providers to optimize antidepressant therapy . Current regimen:  o Sertraline 25 mg daily . Interventions: o Patient is interested in stopping sertraline since he is doing better o Discussed tapering more slowly this time as patient previously did not tolerate discontinuation o Recommended reducing sertraline to every other day for 1-2 weeks, then discontinue . Patient self care activities - Over the next 30 days, patient will: o Taper sertraline as above and then discontinue o Contact pharmacist and/or provider with issues or concerns  Medication management . Pharmacist Clinical Goal(s): o Over the next 30 days, patient will work with PharmD and providers to achieve optimal medication adherence . Current pharmacy: [facility] order, [facility] . Interventions o Comprehensive medication review performed. o Continue current medication management strategy . Patient self care activities - Over the next 30 days, patient will: o Focus on medication adherence by fill date o Take medications as prescribed o Report any questions or concerns to PharmD and/or provider(s)  Initial goal documentation        The patient verbalized understanding of instructions provided today and agreed to receive a mailed copy of patient instruction and/or educational materials.  Telephone follow up appointment with pharmacy team member scheduled for: 1 month  Charlene Brooke, PharmD Clinical Pharmacist [facility] at [facility] [phone]    Managing Loss, Adult People experience loss in many different ways throughout their lives. Events such as moving, changing jobs, and losing friends can create a  sense of loss. The loss may be as serious as a major health change, divorce, death of a pet, or death of a loved one. All of  these types of loss are likely to create a physical and emotional reaction known as grief. Grief is the result of a major change or an absence of something or someone that you count on. Grief is a normal reaction to loss. A variety of factors can affect your grieving experience, including:  The nature of your loss.  Your relationship to what or whom you lost.  Your understanding of grief and how to manage it.  Your support system. How to manage lifestyle changes Keep to your normal routine as much as possible.  If you have trouble focusing or doing normal activities, it is acceptable to take some time away from your normal routine.  Spend time with friends and loved ones.  Eat a healthy diet, get plenty of sleep, and rest when you feel tired. How to recognize changes  The way that you deal with your grief will affect your ability to function as you normally do. When grieving, you may experience these changes:  Numbness, shock, sadness, anxiety, anger, denial, and guilt.  Thoughts about death.  Unexpected crying.  A physical sensation of emptiness in your stomach.  Problems sleeping and eating.  Tiredness (fatigue).  Loss of interest in normal activities.  Dreaming about or imagining seeing the person who died.  A need to remember what or whom you lost.  Difficulty thinking about anything other than your loss for a period of time.  Relief. If you have been expecting the loss for a while, you may feel a sense of relief when it happens. Follow these instructions at home:  Activity Express your feelings in healthy ways, such as:  Talking with others about your loss. It may be helpful to find others who have had a similar loss, such as a support group.  Writing down your feelings in a journal.  Doing physical activities to release stress and emotional energy.  Doing creative activities like painting, sculpting, or playing or listening to music.  Practicing  resilience. This is the ability to recover and adjust after facing challenges. Reading some resources that encourage resilience may help you to learn ways to practice those behaviors. General instructions  Be patient with yourself and others. Allow the grieving process to happen, and remember that grieving takes time. ? It is likely that you may never feel completely done with some grief. You may find a way to move on while still cherishing memories and feelings about your loss. ? Accepting your loss is a process. It can take months or longer to adjust.  Keep all follow-up visits as told by your health care provider. This is important. Where to find support To get support for managing loss:  Ask your health care provider for help and recommendations, such as grief counseling or therapy.  Think about joining a support group for people who are managing a loss. Where to find more information You can find more information about managing loss from:  American Society of Clinical Oncology: www.cancer.net  American Psychological Association: TVSte[employer].ch Contact a health care provider if:  Your grief is extreme and keeps getting worse.  You have ongoing grief that does not improve.  Your body shows symptoms of grief, such as illness.  You feel depressed, anxious, or lonely. Get help right away if:  You have thoughts about hurting yourself or others. If  you ever feel like you may hurt yourself or others, or have thoughts about taking your own life, get help right away. You can go to your nearest emergency department or call:  Your local emergency services (911 in the [place]  A suicide crisis helpline, such as the [place] at 9478012543. This is open 24 hours a day. Summary  Grief is the result of a major change or an absence of someone or something that you count on. Grief is a normal reaction to loss.  The depth of grief and the period of recovery  depend on the type of loss and your ability to adjust to the change and process your feelings.  Processing grief requires patience and a willingness to accept your feelings and talk about your loss with people who are supportive.  It is important to find resources that work for you and to realize that people experience grief differently. There is not one grieving process that works for everyone in the same way.  Be aware that when grief becomes extreme, it can lead to more severe issues like isolation, depression, anxiety, or suicidal thoughts. Talk with your health care provider if you have any of these issues. This information is not intended to replace advice given to you by your health care provider. Make sure you discuss any questions you have with your health care provider. Document Revised: 12/08/2018 Document Reviewed: 02/17/2017 Elsevier Patient Education  [place].

## 2020-02-19 NOTE — Chronic Care Management (AMB) (Signed)
Chronic Care Management Pharmacy  Name: Fernando Walker  MRN: [id number] DOB: 12-10-1932   Chief Complaint/ HPI  Fernando Walker,  86 y.o. , male presents for their Follow-Up CCM visit with the clinical pharmacist via telephone due to COVID-19 Pandemic.  PCP : Binnie Rail, MD  Their chronic conditions include: HTN, CAD (CABG 01/11/1994), Afib, HLD, BPH, GERD  Pt reports his wife of 19 years passed away in 12-Jan-2023. He is grieving but his family and friends have been visiting him and getting him through.    Office Visits: 11/16/19 Dr Quay Burow: acute visit for unexplained weight loss. Wife recently moved to SNF. Checked labs, all normal/unchanged from previous. Pt to continue checking weights at home, appears stabilized.  08/06/19 Dr Quay Burow: pt experiencing caregiver stress, started sertraline 25 mg daily.   Consult Visit: 01/02/20 Dr Angelena Form (cardiology): pt stable, no med changes.  11/01/19 Dr Lovena Le (cardiology EP): pt reports dizziness, etiology unclear, doubt related to Afib. Rec'd watchful waiting. No med changes.   10/03/19 NP (cardiology): No med changes, referred to EP for bradycardia.   08/24/19 Dr Angelena Form (cardiology): Pt returned Holter monitor. HR 39 at night, Afib rates to 180 bpm. Start Eliquis. Use metoprolol prn for palpitations (could not tolerate daily due to dizziness). Referred to EP  Medications: Outpatient Encounter Medications as of 02/19/2020  Medication Sig  . apixaban (ELIQUIS) 5 MG TABS tablet Take 1 tablet (5 mg total) by mouth 2 (two) times daily.  . bimatoprost (LUMIGAN) 0.01 % SOLN Place 1 drop into both eyes at bedtime.  . sertraline (ZOLOFT) 25 MG tablet Take 1 tablet (25 mg total) by mouth daily.  . simvastatin (ZOCOR) 20 MG tablet TAKE 1 TABLET BY MOUTH AT  BEDTIME  . triamcinolone ointment (KENALOG) 0.1 % Apply 1 application topically 2 (two) times daily as needed (rash).  . vitamin B-12 (CYANOCOBALAMIN) 1000 MCG tablet Take 1,000 mcg by mouth every  evening.   No facility-administered encounter medications on file as of 02/19/2020.     Current Diagnosis/Assessment:  SDOH Interventions     Most Recent Value  SDOH Interventions  SDOH Interventions for the Following Domains  Financial Strain  Financial Strain Interventions  Other (Comment) [medications are affordable]      Goals Addressed            This Visit's Progress   . Pharmacy Care Plan       CARE PLAN ENTRY  Current Barriers:  . Chronic Disease Management support, education, and care coordination needs related to Hypertension, Hyperlipidemia, Atrial Fibrillation, and Depression/Grief   Hypertension - Blood Pressure . Pharmacist Clinical Goal(s): o Over the next 30 days, patient will work with PharmD and providers to maintain BP goal 110/60 - 140/90 . Current regimen:  o No medications . Interventions: o Discussed possibility of orthostatic hypotension - BP drops after standing, causing dizziness/lightheadedness o Discussed importance of hydration and adequate nutrition to maintain BP . Patient self care activities - Over the next 30 days, patient will: o Check BP 1-2x weekly, document, and provide at future appointments o Ensure daily salt intake < 2300 mg/day o Maintain adequate fluid intake to keep BP stable  Hyperlipidemia/Heart Disease . Pharmacist Clinical Goal(s): o Over the next 30 days, patient will work with PharmD and providers to maintain LDL goal < 70 . Current regimen:  o Simvastatin 20 mg at bedtime o Nitroglycerin 0.4 mg as needed . Interventions: o Discussed benefits of statin for maintaining cholesterol at goal  and preventing heart attack/stroke . Patient self care activities - Over the next 30 days, patient will: o Take medication as prescribed  Atrial Fibrillation . Pharmacist Clinical Goal(s) o Over the next 30 days, patient will work with PharmD and providers to optimize anticoagulation therapy . Current regimen:  o Eliquis 5 mg  BID . Interventions: o Discussed benefits of Eliquis for stroke prevention o Discussed bleeding risk with Eliquis and drug interactions with OTC pain meds o Discussed cost of Eliquis is currently reasonable . Patient self care activities - Over the next 30 days, patient will: o Monitor for signs and symptoms of bleeding o Avoid OTC NSAIDs for pain (ibuprofen, naproxen)  Depression/Anxiety . Pharmacist Clinical Goal(s) o Over the next 30 days, patient will work with PharmD and providers to optimize antidepressant therapy . Current regimen:  o Sertraline 25 mg daily . Interventions: o Patient is interested in stopping sertraline since he is doing better o Discussed tapering more slowly this time as patient previously did not tolerate discontinuation o Recommended reducing sertraline to every other day for 1-2 weeks, then discontinue . Patient self care activities - Over the next 30 days, patient will: o Taper sertraline as above and then discontinue o Contact pharmacist and/or provider with issues or concerns  Medication management . Pharmacist Clinical Goal(s): o Over the next 30 days, patient will work with PharmD and providers to achieve optimal medication adherence . Current pharmacy: [facility] order, [facility] . Interventions o Comprehensive medication review performed. o Continue current medication management strategy . Patient self care activities - Over the next 30 days, patient will: o Focus on medication adherence by fill date o Take medications as prescribed o Report any questions or concerns to PharmD and/or provider(s)  Initial goal documentation        AFIB/Hypertension   Patient is currently rate controlled. Checks HR at home with pulse ox  Office blood pressures are  BP Readings from Last 3 Encounters:  11/16/19 (!) 152/76  11/01/19 102/68  10/03/19 122/60   Patient checks BP at home infrequently  Patient home BP readings are ranging:  n/a  Patient has failed these meds in past: trandolapril Patient is currently controlled on the following medications:   Eliquis 5 mg BID  We discussed: Last visit pt was experiencing low BP after standing, counseled on good oral intake to maintain BP and standing slowly to prevent falls. Today patient reports dizziness is much improved, he has been drinking more water lately and is happy with results.   Plan  Continue current medications  Monitor BP and HR at home Ensure adequate fluid intake   Hyperlipidemia/CAD   Lipid Panel     Component Value Date/Time   CHOL 142 01/22/2019 0801   TRIG 56 01/22/2019 0801   HDL 64 01/22/2019 0801   CHOLHDL 2.2 01/22/2019 0801   LDLCALC 67 01/22/2019 0801   LABVLDL 11 01/22/2019 0801    CAD - hx CABG 1995  Patient has failed these meds in past: n/a Patient is currently controlled on the following medications:   simvastatin 20 mg HS,   nitroglycerin 0.4 mg SL prn  We discussed:  Pt has been on simvastatin for many years. He was off of it for a 3-year period about 6 years ago when he and wife decided it was no longer beneficial. He started it again at Dr Camillia Herter request and is now taking it consistently.   Plan  Continue current medications    Depression/anxiety  Patient has failed these meds in past: hydroxyzine Patient is currently controlled on the following medications: sertraline 25 mg daily   We discussed: Pt is interested in discontinuing sertraline, he was previously put on it to help with stressful caregiving process. He tried to d/c sertraline shortly before his wife passed, but felt worse so he restarted. Wife has now passed and pt has come to terms with this, he reports doing much better. He would like to try d/c sertraline again. Will try a slower taper.  Plan  Recommended to taper sertraline to 25 mg every other day for 1-2 weeks, then d/c Counseled patient to restart sertraline if mood or anxiety worsen Pt  has AWV in 3 weeks and will check in with RN at that time  Medication Management   Pt uses [facility] for Eliquis, all others from [facility] mail Uses 2 pill boxes - AM and PM boxes Pt endorses 100% compliance  We discussed: Pt uses mail order for all meds at no cost, except he found Eliquis is cheaper at [facility]. Eliquis cost is currently reasonable, discussed coverage gap so patient is aware if med suddenly increased in price.  Plan  Continue current medications  Reassess Eliquis cost prior to coverage gap   Follow up: 1 month phone visit  Charlene Brooke, PharmD Clinical Pharmacist [facility] [phone]

## 2020-03-10 ENCOUNTER — Ambulatory Visit (INDEPENDENT_AMBULATORY_CARE_PROVIDER_SITE_OTHER): Payer: Medicare Other

## 2020-03-10 ENCOUNTER — Other Ambulatory Visit: Payer: Self-pay

## 2020-03-10 VITALS — BP 126/70 | HR 60 | Temp 98.3°F | Ht 74.0 in | Wt 193.4 lb

## 2020-03-10 DIAGNOSIS — Z0000 Encounter for general adult medical examination without abnormal findings: Secondary | ICD-10-CM

## 2020-03-10 NOTE — Patient Instructions (Signed)
Fernando Walker , Thank you for taking time to come for your Medicare Wellness Visit. I appreciate your ongoing commitment to your health goals. Please review the following plan we discussed and let me know if I can assist you in the future.   Screening recommendations/referrals: Colonoscopy: last done 09/01/2011; not recommended due to age Recommended yearly ophthalmology/optometry visit for glaucoma screening and checkup Recommended yearly dental visit for hygiene and checkup  Vaccinations: Influenza vaccine: 07/10/2019 Pneumococcal vaccine: completed Tdap vaccine: 04/27/2019 Shingles vaccine: never done; can check with local pharmacy or with Dr. Quay Burow  Covid-19: completed   Advanced directives: Yes; please bring copy to next appointment to be scanned into chart.  Conditions/risks identified: Yes  Next appointment: Schedule 1 year Annual Wellness Visit with [place] 65 Years and Older, Male Preventive care refers to lifestyle choices and visits with your health care provider that can promote health and wellness. What does preventive care include?  A yearly physical exam. This is also called an annual well check.  Dental exams once or twice a year.  Routine eye exams. Ask your health care provider how often you should have your eyes checked.  Personal lifestyle choices, including:  Daily care of your teeth and gums.  Regular physical activity.  Eating a healthy diet.  Avoiding tobacco and drug use.  Limiting alcohol use.  Practicing safe sex.  Taking low doses of aspirin every day.  Taking vitamin and mineral supplements as recommended by your health care provider. What happens during an annual well check? The services and screenings done by your health care provider during your annual well check will depend on your age, overall health, lifestyle risk factors, and family history of disease. Counseling  Your health care provider may ask you  questions about your:  Alcohol use.  Tobacco use.  Drug use.  Emotional well-being.  Home and relationship well-being.  Sexual activity.  Eating habits.  History of falls.  Memory and ability to understand (cognition).  Work and work Statistician. Screening  You may have the following tests or measurements:  Height, weight, and BMI.  Blood pressure.  Lipid and cholesterol levels. These may be checked every 5 years, or more frequently if you are over 49 years old.  Skin check.  Lung cancer screening. You may have this screening every year starting at age 4 if you have a 30-pack-year history of smoking and currently smoke or have quit within the past 15 years.  Fecal occult blood test (FOBT) of the stool. You may have this test every year starting at age 60.  Flexible sigmoidoscopy or colonoscopy. You may have a sigmoidoscopy every 5 years or a colonoscopy every 10 years starting at age 41.  Prostate cancer screening. Recommendations will vary depending on your family history and other risks.  Hepatitis C blood test.  Hepatitis B blood test.  Sexually transmitted disease (STD) testing.  Diabetes screening. This is done by checking your blood sugar (glucose) after you have not eaten for a while (fasting). You may have this done every 1-3 years.  Abdominal aortic aneurysm (AAA) screening. You may need this if you are a current or former smoker.  Osteoporosis. You may be screened starting at age 49 if you are at high risk. Talk with your health care provider about your test results, treatment options, and if necessary, the need for more tests. Vaccines  Your health care provider may recommend certain vaccines, such as:  Influenza vaccine. This is recommended  every year.  Tetanus, diphtheria, and acellular pertussis (Tdap, Td) vaccine. You may need a Td booster every 10 years.  Zoster vaccine. You may need this after age 39.  Pneumococcal 13-valent conjugate  (PCV13) vaccine. One dose is recommended after age 16.  Pneumococcal polysaccharide (PPSV23) vaccine. One dose is recommended after age 70. Talk to your health care provider about which screenings and vaccines you need and how often you need them. This information is not intended to replace advice given to you by your health care provider. Make sure you discuss any questions you have with your health care provider. Document Released: 10/31/2015 Document Revised: 06/23/2016 Document Reviewed: 08/05/2015 Elsevier Interactive Patient Education  2017 [place] Prevention in the Home Falls can cause injuries. They can happen to people of all ages. There are many things you can do to make your home safe and to help prevent falls. What can I do on the outside of my home?  Regularly fix the edges of walkways and driveways and fix any cracks.  Remove anything that might make you trip as you walk through a door, such as a raised step or threshold.  Trim any bushes or trees on the path to your home.  Use bright outdoor lighting.  Clear any walking paths of anything that might make someone trip, such as rocks or tools.  Regularly check to see if handrails are loose or broken. Make sure that both sides of any steps have handrails.  Any raised decks and porches should have guardrails on the edges.  Have any leaves, snow, or ice cleared regularly.  Use sand or salt on walking paths during winter.  Clean up any spills in your garage right away. This includes oil or grease spills. What can I do in the bathroom?  Use night lights.  Install grab bars by the toilet and in the tub and shower. Do not use towel bars as grab bars.  Use non-skid mats or decals in the tub or shower.  If you need to sit down in the shower, use a plastic, non-slip stool.  Keep the floor dry. Clean up any water that spills on the floor as soon as it happens.  Remove soap buildup in the tub or shower  regularly.  Attach bath mats securely with double-sided non-slip rug tape.  Do not have throw rugs and other things on the floor that can make you trip. What can I do in the bedroom?  Use night lights.  Make sure that you have a light by your bed that is easy to reach.  Do not use any sheets or blankets that are too big for your bed. They should not hang down onto the floor.  Have a firm chair that has side arms. You can use this for support while you get dressed.  Do not have throw rugs and other things on the floor that can make you trip. What can I do in the kitchen?  Clean up any spills right away.  Avoid walking on wet floors.  Keep items that you use a lot in easy-to-reach places.  If you need to reach something above you, use a strong step stool that has a grab bar.  Keep electrical cords out of the way.  Do not use floor polish or wax that makes floors slippery. If you must use wax, use non-skid floor wax.  Do not have throw rugs and other things on the floor that can make you trip. What  can I do with my stairs?  Do not leave any items on the stairs.  Make sure that there are handrails on both sides of the stairs and use them. Fix handrails that are broken or loose. Make sure that handrails are as long as the stairways.  Check any carpeting to make sure that it is firmly attached to the stairs. Fix any carpet that is loose or worn.  Avoid having throw rugs at the top or bottom of the stairs. If you do have throw rugs, attach them to the floor with carpet tape.  Make sure that you have a light switch at the top of the stairs and the bottom of the stairs. If you do not have them, ask someone to add them for you. What else can I do to help prevent falls?  Wear shoes that:  Do not have high heels.  Have rubber bottoms.  Are comfortable and fit you well.  Are closed at the toe. Do not wear sandals.  If you use a stepladder:  Make sure that it is fully  opened. Do not climb a closed stepladder.  Make sure that both sides of the stepladder are locked into place.  Ask someone to hold it for you, if possible.  Clearly mark and make sure that you can see:  Any grab bars or handrails.  First and last steps.  Where the edge of each step is.  Use tools that help you move around (mobility aids) if they are needed. These include:  Canes.  Walkers.  Scooters.  Crutches.  Turn on the lights when you go into a dark area. Replace any light bulbs as soon as they burn out.  Set up your furniture so you have a clear path. Avoid moving your furniture around.  If any of your floors are uneven, fix them.  If there are any pets around you, be aware of where they are.  Review your medicines with your doctor. Some medicines can make you feel dizzy. This can increase your chance of falling. Ask your doctor what other things that you can do to help prevent falls. This information is not intended to replace advice given to you by your health care provider. Make sure you discuss any questions you have with your health care provider. Document Released: 07/31/2009 Document Revised: 03/11/2016 Document Reviewed: 11/08/2014 Elsevier Interactive Patient Education  2017 [employer].

## 2020-03-10 NOTE — Progress Notes (Signed)
Subjective:   Fernando Walker is a 86 y.o. male who presents for Medicare Annual/Subsequent preventive examination.  Review of Systems:  No ROS. Medicare Wellness Visit Cardiac Risk Factors include: advanced age (>51mn, >>81women);dyslipidemia;family history of premature cardiovascular disease;hypertension;male gender     Objective:    Vitals: BP 126/70 (BP Location: Right Arm, Patient Position: Sitting, Cuff Size: Normal)   Pulse 60   Temp 98.3 F (36.8 C)   Ht _0  (1.88 m)   Wt 193 lb 6.4 oz (87.7 kg)   SpO2 95%   BMI 24.83 kg/m   Body mass index is 24.83 kg/m.  Advanced Directives 03/10/2020 07/25/2019 03/06/2019 02/24/2018 11/29/2017 11/10/2017 09/22/2017  Does Patient Have a Medical Advance Directive? _1  Yes Yes  Type of Advance Directive - [place] will [place]over [place] NorthLiving will [place]Living will Living will;Healthcare Power of ABuffalo SoapstoneLiving will HFive PointsLiving will  Does patient want to make changes to medical advance directive? No - Patient declined No - Patient declined - - - - -  Copy of HClem[place]in Chart? - No - copy requested No - copy requested No - copy requested No - copy requested - -    Tobacco Social History   Tobacco Use  Smoking Status Former Smoker  . Years: 30.00  . Quit date: 10/18/1984  . Years since quitting: 35.4  Smokeless Tobacco Former UEngineer, structuralgiven: No   Clinical Intake:  Pre-visit preparation completed: No  Pain : No/denies pain Pain Score: 0-No pain     BMI - recorded: 24.83 Nutritional Status: BMI of 19-24  Normal Nutritional Risks: None Diabetes: No  How often do you need to have someone help you when you read instructions, pamphlets, or other written materials from your doctor or pharmacy?: 1 - Never What is the last grade level you completed in school?:  Bachelor's Degree  Interpreter Needed?: No  Information entered by :: Shaylynne Lunt N. HLowell Guitar LPN  Past Medical History:  Diagnosis Date  . CAD (coronary artery disease) of artery bypass graft   . Carotid arterial disease (HCC)    Nonobstructive  . Cataract   . Dyslipidemia   . GERD (gastroesophageal reflux disease)   . 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 Hyperlipidemia   . Hypertension   . Hypertrophy of prostate with urinary obstruction and other lower urinary tract symptoms (LUTS)   . Pneumonia    As a child  . Pneumonia    as child   Past Surgical History:  Procedure Laterality Date  . cataracts     both eyes  . COLONOSCOPY    . colonoscopy with polypectomy  2004  . CORONARY ARTERY BYPASS GRAFT  1995   X 7  . FASCIECTOMY Left 11/29/2017   Procedure: SEGMENTAL FASCIECTOMY LEFT SMALL FINGER;  Surgeon: KDaryll Brod MD;  [facility]  Service: Orthopedics;  Laterality: Left;  block and MAC  . LEFT HEART CATH AND CORS/GRAFTS ANGIOGRAPHY N/A 07/25/2019   Procedure: LEFT HEART CATH AND CORS/GRAFTS ANGIOGRAPHY;  Surgeon: MBurnell Blanks MD;  [facility];  Service: Cardiovascular;  Laterality: N/A;  . POLYPECTOMY     Family History  Problem Relation Age of Onset  . Stroke Father 856 . Diabetes Neg Hx   . Cancer Neg Hx   . GER disease Neg Hx   .  Heart attack Neg Hx   . Colon cancer Neg Hx   . Esophageal cancer Neg Hx   . Stomach cancer Neg Hx   . Heart disease Neg Hx    Social History   Socioeconomic History  . Marital status: Widowed    Spouse name: Not on file  . Number of children: 2  . Years of education: 16  . Highest education level: Bachelor's degree (e.g., BA, AB, BS)  Occupational History  . Occupation: Retired  Tobacco Use  . Smoking status: Former Smoker    Years: 30.00    Quit date: 10/18/1984    Years since quitting: 35.4  . Smokeless tobacco: Former Network engineer and Sexual Activity  . Alcohol use: Yes      Alcohol/week: 12.0 standard drinks    Types: 7 Glasses of wine, 5 Cans of beer per week  . Drug use: No  . Sexual activity: Not Currently  Other Topics Concern  . Not on file  Social History Narrative  . Not on file   Social Determinants of Health   Financial Resource Strain: Low Risk   . Difficulty of Paying Living Expenses: Not hard at all  Food Insecurity: No Food Insecurity  . Worried About Charity fundraiser in the Last Year: Never true  . Ran Out of Food in the Last Year: Never true  Transportation Needs: No Transportation Needs  . Lack of Transportation (Medical): No  . Lack of Transportation (Non-Medical): No  Physical Activity:   . Days of Exercise per Week:   . Minutes of Exercise per Session:   Stress: No Stress Concern Present  . Feeling of Stress : Not at all  Social Connections: Slightly Isolated  . Frequency of Communication with Friends and Family: More than three times a week  . Frequency of Social Gatherings with Friends and Family: More than three times a week  . Attends Religious Services: More than 4 times per year  . Active Member of Clubs or Organizations: Yes  . Attends Archivist Meetings: More than 4 times per year  . Marital Status: Widowed    Outpatient Encounter Medications as of 03/10/2020  Medication Sig  . apixaban (ELIQUIS) 5 MG TABS tablet Take 1 tablet (5 mg total) by mouth 2 (two) times daily.  . bimatoprost (LUMIGAN) 0.01 % SOLN Place 1 drop into both eyes at bedtime.  . sertraline (ZOLOFT) 25 MG tablet Take 1 tablet (25 mg total) by mouth daily. (Patient taking differently: Take 25 mg by mouth every other day. )  . simvastatin (ZOCOR) 20 MG tablet TAKE 1 TABLET BY MOUTH AT  BEDTIME  . triamcinolone ointment (KENALOG) 0.1 % Apply 1 application topically 2 (two) times daily as needed (rash).  . vitamin B-12 (CYANOCOBALAMIN) 1000 MCG tablet Take 1,000 mcg by mouth every evening.   No facility-administered encounter medications  on file as of 03/10/2020.    Activities of Daily Living In your present state of health, do you have any difficulty performing the following activities: 03/10/2020  Hearing? Y  Comment hearing aids  Vision? N  Comment wears glasses  Difficulty concentrating or making decisions? N  Walking or climbing stairs? N  Dressing or bathing? N  Doing errands, shopping? N  Preparing Food and eating ? N  Using the Toilet? N  In the past six months, have you accidently leaked urine? N  Do you have problems with loss of bowel control? N  Managing your Medications? N  Managing your Finances? N  Housekeeping or managing your Housekeeping? N  Some recent data might be hidden    Patient Care Team: Binnie Rail, MD as PCP - General (Internal Medicine) Burnell Blanks, MD as PCP - Cardiology (Cardiology) Charlton Haws, [facility] as Pharmacist (Pharmacist)   Assessment:   This is a routine wellness examination for [place].  Exercise Activities and Dietary recommendations Current Exercise Habits: The patient does not participate in regular exercise at present, Exercise limited by: None identified  Goals    . Client understands the importance of follow-up with providers by attending scheduled visits (pt-stated)     To maintain my current health status by continuing to eat healthy and stay physically active & socially active.    Marland Kitchen Pharmacy Care Plan     CARE PLAN ENTRY  Current Barriers:  . Chronic Disease Management support, education, and care coordination needs related to Hypertension, Hyperlipidemia, Atrial Fibrillation, and Depression/Grief   Hypertension - Blood Pressure . Pharmacist Clinical Goal(s): o Over the next 30 days, patient will work with PharmD and providers to maintain BP goal 110/60 - 140/90 . Current regimen:  o No medications . Interventions: o Discussed possibility of orthostatic hypotension - BP drops after standing, causing  dizziness/lightheadedness o Discussed importance of hydration and adequate nutrition to maintain BP . Patient self care activities - Over the next 30 days, patient will: o Check BP 1-2x weekly, document, and provide at future appointments o Ensure daily salt intake < 2300 mg/day o Maintain adequate fluid intake to keep BP stable  Hyperlipidemia/Heart Disease . Pharmacist Clinical Goal(s): o Over the next 30 days, patient will work with PharmD and providers to maintain LDL goal < 70 . Current regimen:  o Simvastatin 20 mg at bedtime o Nitroglycerin 0.4 mg as needed . Interventions: o Discussed benefits of statin for maintaining cholesterol at goal and preventing heart attack/stroke . Patient self care activities - Over the next 30 days, patient will: o Take medication as prescribed  Atrial Fibrillation . Pharmacist Clinical Goal(s) o Over the next 30 days, patient will work with PharmD and providers to optimize anticoagulation therapy . Current regimen:  o Eliquis 5 mg BID . Interventions: o Discussed benefits of Eliquis for stroke prevention o Discussed bleeding risk with Eliquis and drug interactions with OTC pain meds o Discussed cost of Eliquis is currently reasonable . Patient self care activities - Over the next 30 days, patient will: o Monitor for signs and symptoms of bleeding o Avoid OTC NSAIDs for pain (ibuprofen, naproxen)  Depression/Anxiety . Pharmacist Clinical Goal(s) o Over the next 30 days, patient will work with PharmD and providers to optimize antidepressant therapy . Current regimen:  o Sertraline 25 mg daily . Interventions: o Patient is interested in stopping sertraline since he is doing better o Discussed tapering more slowly this time as patient previously did not tolerate discontinuation o Recommended reducing sertraline to every other day for 1-2 weeks, then discontinue . Patient self care activities - Over the next 30 days, patient will: o Taper  sertraline as above and then discontinue o Contact pharmacist and/or provider with issues or concerns  Medication management . Pharmacist Clinical Goal(s): o Over the next 30 days, patient will work with PharmD and providers to achieve optimal medication adherence . Current pharmacy: [facility] order, [facility] . Interventions o Comprehensive medication review performed. o Continue current medication management strategy . Patient self care activities - Over the next 30 days, patient will:  o Focus on medication adherence by fill date o Take medications as prescribed o Report any questions or concerns to PharmD and/or provider(s)  Initial goal documentation        Fall Risk Fall Risk  03/10/2020 03/06/2019 02/24/2018 07/20/2017 06/14/2016  Falls in the past year? 0 0 No No No  Comment - - - - Emmi Telephone Survey: data to providers prior to load  Number falls in past yr: 0 0 - - -  Injury with Fall? 0 - - - -  Risk for fall due to : No Fall Risks - - - -  Follow up Falls evaluation completed;Education provided;Falls prevention discussed - - - -   Is the patient's home free of loose throw rugs in walkways, pet beds, electrical cords, etc?   yes      Grab bars in the bathroom? yes      Handrails on the stairs?   yes      Adequate lighting?   yes  Timed Get Up and Go Performed: not indicated  Depression Screen PHQ 2/9 Scores 03/10/2020 01/15/2020 08/06/2019 03/06/2019  PHQ - 2 Score - - 5 2  PHQ- 9 Score - - 9 3  Exception Documentation Other- indicate reason in comment box Other- indicate reason in comment box - -  Not completed Patient is still grieving his wife; he is on treatment. pt just lost his wife a few days ago, he is grieving - -    Cognitive Function MMSE - Mini Mental State Exam 02/24/2018 06/08/2016  Not completed: - (No Data)  Orientation to time 5 -  Orientation to Place 5 -  Registration 3 -  Attention/ Calculation 5 -  Recall 1 -  Language- name 2 objects 2 -   Language- repeat 1 -  Language- follow 3 step command 3 -  Language- read & follow direction 1 -  Write a sentence 1 -  Copy design 1 -  Total score 28 -        Immunization History  Administered Date(s) Administered  . Fluad Quad(high Dose 65+) 07/10/2019  . Influenza Split 07/27/2011, 08/03/2012  . Influenza Whole 09/07/2007, 07/24/2008, 07/23/2009, 08/07/2010  . Influenza, High Dose Seasonal PF 08/01/2013, 07/20/2017, 07/04/2018  . Influenza,inj,Quad PF,6+ Mos 06/26/2014, 08/04/2015  . Pneumococcal Conjugate-13 11/03/2015  . Pneumococcal Polysaccharide-23 09/13/2012  . Tdap 04/27/2019    Qualifies for Shingles Vaccine? Yes, will check with local pharmacy  Screening Tests Health Maintenance  Topic Date Due  . COVID-19 Vaccine (1) Never done  . INFLUENZA VACCINE  05/18/2020  . TETANUS/TDAP  04/26/2029  . PNA vac Low Risk Adult  Completed   Cancer Screenings: Lung: Low Dose CT Chest recommended if Age 44-80 years, 30 pack-year currently smoking OR have quit w/in 15years. Patient does not qualify. Colorectal: not recommended due to agr      Plan:     Reviewed health maintenance screenings with patient today and relevant education, vaccines, and/or referrals were provided.    Continue doing brain stimulating activities (puzzles, reading, adult coloring books, staying active) to keep memory sharp.    Continue to eat heart healthy diet (full of fruits, vegetables, whole grains, lean protein, water--limit salt, fat, and sugar intake) and increase physical activity as tolerated.  I have personally reviewed and noted the following in the patient's chart:   . Medical and social history . Use of alcohol, tobacco or illicit drugs  . Current medications and supplements . Functional ability and status .  Nutritional status . Physical activity . Advanced directives . List of other physicians . Hospitalizations, surgeries, and ER visits in previous 12  months . Vitals . Screenings to include cognitive, depression, and falls . Referrals and appointments  In addition, I have reviewed and discussed with patient certain preventive protocols, quality metrics, and best practice recommendations. A written personalized care plan for preventive services as well as general preventive health recommendations were provided to patient.     Sheral Flow, LPN  01/06/2247 Nurse Health Advisor

## 2020-03-18 ENCOUNTER — Ambulatory Visit: Payer: Medicare Other | Admitting: Pharmacist

## 2020-03-18 ENCOUNTER — Other Ambulatory Visit: Payer: Self-pay

## 2020-03-18 DIAGNOSIS — I480 Paroxysmal atrial fibrillation: Secondary | ICD-10-CM

## 2020-03-18 DIAGNOSIS — I10 Essential (primary) hypertension: Secondary | ICD-10-CM

## 2020-03-18 DIAGNOSIS — I25118 Atherosclerotic heart disease of native coronary artery with other forms of angina pectoris: Secondary | ICD-10-CM

## 2020-03-18 NOTE — Patient Instructions (Addendum)
Visit Information  Goals Addressed            This Visit's Progress   . Pharmacy Care Plan       CARE PLAN ENTRY  Current Barriers:  . Chronic Disease Management support, education, and care coordination needs related to Hypertension, Hyperlipidemia, Atrial Fibrillation, and Depression/Grief , Glaucoma   Hypertension - Blood Pressure BP Readings from Last 3 Encounters:  03/10/20 126/70  01/02/20 (!) 144/60  11/16/19 (!) 152/76 .  Pharmacist Clinical Goal(s): o Over the next 30 days, patient will work with PharmD and providers to maintain BP goal 110/60 - 140/90 . Current regimen:  o No medications . Interventions: o Discussed possibility of orthostatic hypotension - BP drops after standing, causing dizziness/lightheadedness o Discussed importance of hydration and adequate nutrition to maintain BP . Patient self care activities - Over the next 30 days, patient will: o Check BP 1-2x weekly, document, and provide at future appointments o Ensure daily salt intake < 2300 mg/day o Maintain adequate fluid intake to keep BP stable  Hyperlipidemia/Heart Disease Lipid Panel  Component Value Date/Time   CHOL 142 01/22/2019 0801   TRIG 56 01/22/2019 0801   HDL 64 01/22/2019 0801   LDLCALC 67 01/22/2019 0801 .  Pharmacist Clinical Goal(s): o Over the next 30 days, patient will work with PharmD and providers to maintain LDL goal < 70 . Current regimen:  o Simvastatin 20 mg at bedtime o Nitroglycerin 0.4 mg as needed . Interventions: o Discussed benefits of statin for maintaining cholesterol at goal and preventing heart attack/stroke . Patient self care activities - Over the next 30 days, patient will: o Take medication as prescribed  Atrial Fibrillation . Pharmacist Clinical Goal(s) o Over the next 30 days, patient will work with PharmD and providers to optimize anticoagulation therapy . Current regimen:  o Eliquis 5 mg BID . Interventions: o Discussed benefits of Eliquis for  stroke prevention o Discussed bleeding risk with Eliquis and drug interactions with OTC pain meds o Discussed cost of Eliquis is currently reasonable . Patient self care activities - Over the next 30 days, patient will: o Monitor for signs and symptoms of bleeding o Avoid OTC NSAIDs for pain (ibuprofen, naproxen)  Depression/Anxiety . Pharmacist Clinical Goal(s) o Over the next 30 days, patient will work with PharmD and providers to optimize antidepressant therapy . Current regimen:  o No medications . Interventions: o Discussed that sertraline can be restarted at any time if depression or anxiety symptoms worsen . Patient self care activities - Over the next 30 days, patient will: o Interior and spatial designer and/or provider with issues or concerns  Glaucoma . Pharmacist Clinical Goal(s) o Over the next 90 days, patient will work with PharmD and providers to reduce costs for eye drops . Current regimen:  o Lumigan 0.01% drops twice daily . Interventions: o Lumigan is no longer preferred on insurance plan and is Tier 3 ($120/90 days) o Latanoprost (Xalatan) is the preferred product - will request switch from Dr. Gershon Crane . Patient self care activities - Over the next 90 days, patient will: o Continue Lumigan eye drops until Latanoprost drops are delivered  Medication management . Pharmacist Clinical Goal(s): o Over the next 30 days, patient will work with PharmD and providers to achieve optimal medication adherence . Current pharmacy: [facility] order, [facility] . Interventions o Comprehensive medication review performed. o Continue current medication management strategy . Patient self care activities - Over the next 30 days, patient will: o Focus  on medication adherence by fill date o Take medications as prescribed o Report any questions or concerns to PharmD and/or provider(s)  Please see past updates related to this goal by clicking on the "Past Updates" button in the selected  goal         The patient verbalized understanding of instructions provided today and agreed to receive a mailed copy of patient instruction and/or educational materials.  Telephone follow up appointment with pharmacy team member scheduled for: 3 months  Charlene Brooke, PharmD Clinical Pharmacist [facility] Maintenance After Age 51 After age 32, you are at a higher risk for certain long-term diseases and infections as well as injuries from falls. Falls are a major cause of broken bones and head injuries in people who are older than age 77. Getting regular preventive care can help to keep you healthy and well. Preventive care includes getting regular testing and making lifestyle changes as recommended by your health care provider. Talk with your health care provider about:  Which screenings and tests you should have. A screening is a test that checks for a disease when you have no symptoms.  A diet and exercise plan that is right for you. What should I know about screenings and tests to prevent falls? Screening and testing are the best ways to find a health problem early. Early diagnosis and treatment give you the best chance of managing medical conditions that are common after age 18. Certain conditions and lifestyle choices may make you more likely to have a fall. Your health care provider may recommend:  Regular vision checks. Poor vision and conditions such as cataracts can make you more likely to have a fall. If you wear glasses, make sure to get your prescription updated if your vision changes.  Medicine review. Work with your health care provider to regularly review all of the medicines you are taking, including over-the-counter medicines. Ask your health care provider about any side effects that may make you more likely to have a fall. Tell your health care provider if any medicines that you take make you feel dizzy or  sleepy.  Osteoporosis screening. Osteoporosis is a condition that causes the bones to get weaker. This can make the bones weak and cause them to break more easily.  Blood pressure screening. Blood pressure changes and medicines to control blood pressure can make you feel dizzy.  Strength and balance checks. Your health care provider may recommend certain tests to check your strength and balance while standing, walking, or changing positions.  Foot health exam. Foot pain and numbness, as well as not wearing proper footwear, can make you more likely to have a fall.  Depression screening. You may be more likely to have a fall if you have a fear of falling, feel emotionally low, or feel unable to do activities that you used to do.  Alcohol use screening. Using too much alcohol can affect your balance and may make you more likely to have a fall. What actions can I take to lower my risk of falls? General instructions  Talk with your health care provider about your risks for falling. Tell your health care provider if: ? You fall. Be sure to tell your health care provider about all falls, even ones that seem minor. ? You feel dizzy, sleepy, or off-balance.  Take over-the-counter and prescription medicines only as told by your health care provider. These include any supplements.  Eat a healthy diet and  maintain a healthy weight. A healthy diet includes low-fat dairy products, low-fat (lean) meats, and fiber from whole grains, beans, and lots of fruits and vegetables. Home safety  Remove any tripping hazards, such as rugs, cords, and clutter.  Install safety equipment such as grab bars in bathrooms and safety rails on stairs.  Keep rooms and walkways well-lit. Activity   Follow a regular exercise program to stay fit. This will help you maintain your balance. Ask your health care provider what types of exercise are appropriate for you.  If you need a cane or walker, use it as recommended by  your health care provider.  Wear supportive shoes that have nonskid soles. Lifestyle  Do not drink alcohol if your health care provider tells you not to drink.  If you drink alcohol, limit how much you have: ? 0-1 drink a day for women. ? 0-2 drinks a day for men.  Be aware of how much alcohol is in your drink. In the [place] one drink equals one typical bottle of beer (12 oz), one-half glass of wine (5 oz), or one shot of hard liquor (1 oz).  Do not use any products that contain nicotine or tobacco, such as cigarettes and e-cigarettes. If you need help quitting, ask your health care provider. Summary  Having a healthy lifestyle and getting preventive care can help to protect your health and wellness after age 78.  Screening and testing are the best way to find a health problem early and help you avoid having a fall. Early diagnosis and treatment give you the best chance for managing medical conditions that are more common for people who are older than age 41.  Falls are a major cause of broken bones and head injuries in people who are older than age 31. Take precautions to prevent a fall at home.  Work with your health care provider to learn what changes you can make to improve your health and wellness and to prevent falls. This information is not intended to replace advice given to you by your health care provider. Make sure you discuss any questions you have with your health care provider. Document Revised: 01/25/2019 Document Reviewed: 08/17/2017 Elsevier Patient Education  2020 [employer].

## 2020-03-18 NOTE — Chronic Care Management (AMB) (Signed)
Chronic Care Management Pharmacy  Name: Fernando Walker  MRN: [id number] DOB: 1932/10/31   Chief Complaint/ HPI  Fernando Walker,  86 y.o. , male presents for their Follow-Up CCM visit with the clinical pharmacist via telephone due to COVID-19 Pandemic.  PCP : Binnie Rail, MD  Their chronic conditions include: HTN, CAD (CABG 01-18-94), Afib, HLD, BPH, GERD  Pt reports his wife of 63 years passed away in 2023-01-19. He is grieving but his family and friends have been visiting him and getting him through.    Office Visits: 03/10/20 [facility]. 11/16/19 Dr Quay Burow: acute visit for unexplained weight loss. Wife recently moved to SNF. Checked labs, all normal/unchanged from previous. Pt to continue checking weights at home, appears stabilized.  08/06/19 Dr Quay Burow: pt experiencing caregiver stress, started sertraline 25 mg daily.    Consult Visit: 01/02/20 Dr Angelena Form (cardiology): pt stable, no med changes.  11/01/19 Dr Lovena Le (cardiology EP): pt reports dizziness, etiology unclear, doubt related to Afib. Rec'd watchful waiting. No med changes.   10/03/19 NP (cardiology): No med changes, referred to EP for bradycardia.   08/24/19 Dr Angelena Form (cardiology): Pt returned Holter monitor. HR 39 at night, Afib rates to 180 bpm. Start Eliquis. Use metoprolol prn for palpitations (could not tolerate daily due to dizziness). Referred to EP  Medications: Outpatient Encounter Medications as of 03/18/2020  Medication Sig  . apixaban (ELIQUIS) 5 MG TABS tablet Take 1 tablet (5 mg total) by mouth 2 (two) times daily.  . bimatoprost (LUMIGAN) 0.01 % SOLN Place 1 drop into both eyes at bedtime.  . sertraline (ZOLOFT) 25 MG tablet Take 1 tablet (25 mg total) by mouth daily. (Patient taking differently: Take 25 mg by mouth every other day. )  . simvastatin (ZOCOR) 20 MG tablet TAKE 1 TABLET BY MOUTH AT  BEDTIME  . triamcinolone ointment (KENALOG) 0.1 % Apply 1 application topically 2 (two) times daily as needed  (rash).  . vitamin B-12 (CYANOCOBALAMIN) 1000 MCG tablet Take 1,000 mcg by mouth every evening.   No facility-administered encounter medications on file as of 03/18/2020.     Current Diagnosis/Assessment:  SDOH Interventions     Most Recent Value  SDOH Interventions  Financial Strain Interventions  Other (Comment) [switch Lumigan to Latanoprost for cost savings based on formulary]      Goals Addressed            This Visit's Progress   . Pharmacy Care Plan       CARE PLAN ENTRY  Current Barriers:  . Chronic Disease Management support, education, and care coordination needs related to Hypertension, Hyperlipidemia, Atrial Fibrillation, and Depression/Grief , Glaucoma   Hypertension - Blood Pressure BP Readings from Last 3 Encounters:  03/10/20 126/70  01/02/20 (!) 144/60  11/16/19 (!) 152/76 .  Pharmacist Clinical Goal(s): o Over the next 30 days, patient will work with PharmD and providers to maintain BP goal 110/60 - 140/90 . Current regimen:  o No medications . Interventions: o Discussed possibility of orthostatic hypotension - BP drops after standing, causing dizziness/lightheadedness o Discussed importance of hydration and adequate nutrition to maintain BP . Patient self care activities - Over the next 30 days, patient will: o Check BP 1-2x weekly, document, and provide at future appointments o Ensure daily salt intake < 2300 mg/day o Maintain adequate fluid intake to keep BP stable  Hyperlipidemia/Heart Disease Lipid Panel  Component Value Date/Time   CHOL 142 01/22/2019 0801   TRIG 56 01/22/2019 0801  HDL 64 01/22/2019 0801   LDLCALC 67 01/22/2019 0801 .  Pharmacist Clinical Goal(s): o Over the next 30 days, patient will work with PharmD and providers to maintain LDL goal < 70 . Current regimen:  o Simvastatin 20 mg at bedtime o Nitroglycerin 0.4 mg as needed . Interventions: o Discussed benefits of statin for maintaining cholesterol at goal and  preventing heart attack/stroke . Patient self care activities - Over the next 30 days, patient will: o Take medication as prescribed  Atrial Fibrillation . Pharmacist Clinical Goal(s) o Over the next 30 days, patient will work with PharmD and providers to optimize anticoagulation therapy . Current regimen:  o Eliquis 5 mg BID . Interventions: o Discussed benefits of Eliquis for stroke prevention o Discussed bleeding risk with Eliquis and drug interactions with OTC pain meds o Discussed cost of Eliquis is currently reasonable . Patient self care activities - Over the next 30 days, patient will: o Monitor for signs and symptoms of bleeding o Avoid OTC NSAIDs for pain (ibuprofen, naproxen)  Depression/Anxiety . Pharmacist Clinical Goal(s) o Over the next 30 days, patient will work with PharmD and providers to optimize antidepressant therapy . Current regimen:  o No medications . Interventions: o Discussed that sertraline can be restarted at any time if depression or anxiety symptoms worsen . Patient self care activities - Over the next 30 days, patient will: o Interior and spatial designer and/or provider with issues or concerns  Glaucoma . Pharmacist Clinical Goal(s) o Over the next 90 days, patient will work with PharmD and providers to reduce costs for eye drops . Current regimen:  o Lumigan 0.01% drops twice daily . Interventions: o Lumigan is no longer preferred on insurance plan and is Tier 3 ($120/90 days) o Latanoprost (Xalatan) is the preferred product - will request switch from Dr. Gershon Crane . Patient self care activities - Over the next 90 days, patient will: o Continue Lumigan eye drops until Latanoprost drops are delivered  Medication management . Pharmacist Clinical Goal(s): o Over the next 30 days, patient will work with PharmD and providers to achieve optimal medication adherence . Current pharmacy: [facility] order, [facility] . Interventions o Comprehensive medication  review performed. o Continue current medication management strategy . Patient self care activities - Over the next 30 days, patient will: o Focus on medication adherence by fill date o Take medications as prescribed o Report any questions or concerns to PharmD and/or provider(s)  Please see past updates related to this goal by clicking on the "Past Updates" button in the selected goal         AFIB/Hypertension   Patient is currently rate controlled. Checks HR at home with pulse ox  Office blood pressures are  BP Readings from Last 3 Encounters:  11/16/19 (!) 152/76  11/01/19 102/68  10/03/19 122/60   Patient checks BP at home infrequently  Patient home BP readings are ranging: n/a  Patient has failed these meds in past: trandolapril Patient is currently controlled on the following medications:   Eliquis 5 mg BID  We discussed: Pt c/o occasional dizziness, much improved since ensuring adequate hydration and standing slowly to prevent orthostasis.   Plan  Continue current medications  Monitor BP and HR at home Ensure adequate fluid intake   Hyperlipidemia/CAD   Lipid Panel     Component Value Date/Time   CHOL 142 01/22/2019 0801   TRIG 56 01/22/2019 0801   HDL 64 01/22/2019 0801   CHOLHDL 2.2 01/22/2019 0801  [place] 67 01/22/2019 0801   LABVLDL 11 01/22/2019 0801    CAD - hx CABG 1995  Patient has failed these meds in past: n/a Patient is currently controlled on the following medications:   simvastatin 20 mg HS,   nitroglycerin 0.4 mg SL prn  We discussed:  Pt has been on simvastatin for many years. He was off of it for a 3-year period about 6 years ago when he and wife decided it was no longer beneficial. He started it again at Dr Camillia Herter request and is now taking it consistently.   Plan  Continue current medications    Depression/anxiety   Depression screen [facility] 2/9 08/06/2019 03/06/2019 02/24/2018  Decreased Interest 3 0 0  Down, Depressed,  Hopeless 2 2 0  PHQ - 2 Score 5 2 0  Altered sleeping 1 0 0  Tired, decreased energy 1 1 0  Change in appetite 0 0 0  Feeling bad or failure about yourself  0 0 0  Trouble concentrating 0 0 0  Moving slowly or fidgety/restless 1 0 0  Suicidal thoughts 1 0 0  PHQ-9 Score 9 3 0  Difficult doing work/chores - Not difficult at all Not difficult at all   Patient has failed these meds in past: hydroxyzine Patient is currently controlled on the following medications: sertraline 25 mg daily   We discussed: Pt has tapered sertraline and is no longer taking as of 3 days ago. He reports no change in mood on or off medications. Discussed possibility of restarting sertraline in the future if mood worsens.  Plan  Continue to monitor for depressive symptoms   Glaucoma   Patient has failed these meds in past: n/a Patient is currently controlled on the following medications:  . Lumigan (bimatoprost) 0.01% - $120/90 days  We discussed: Pt reports copay for his eye drops doubled last month; per formulary (lasted updated 03/05/2020) latanoprost is the preferred agent and bimatoprost/Lumigan is Tier 3 drug. Will contact eye doctor to request switch to preferred product.  Plan  Contact Dr Gershon Crane regarding switch to preferred product Latanoprost  Medication Management   Pt uses [facility] for Eliquis, all others from [employer] Uses 2 pill boxes - AM and PM boxes Pt endorses 100% compliance  We discussed: Pt uses mail order for all meds at no cost, except he found Eliquis is cheaper at [employer]. Eliquis cost is currently reasonable, discussed coverage gap so patient is aware if med suddenly increased in price.  Plan  Continue current medications  Reassess Eliquis cost prior to coverage gap   Follow up: 3 month phone visit  Charlene Brooke, PharmD Clinical Pharmacist [facility] [phone]

## 2020-03-25 NOTE — Progress Notes (Signed)
Subjective:    Patient ID: Fernando Walker, male    DOB: 25-Sep-1933, 86 y.o.   MRN: [id number]  HPI The patient is here for an acute visit.  Cough:  About > 1 week he started coughing.  It is a dry cough.  He has had rib pain from coughing so much.  He does it early in the morning - that is the worse time.  His nose is running a lot.  He has occasional headaches and thought these symptoms were allergies.  When he would try to go to bed at night he would cough.  If he took deep breaths it would relax him and it would help.  Yesterday morning he had some wheeze - he has not had it since.    Last night he drank water in the middle of the night when he got up to go to the bathroom and he did not cough when he laid back down and was able to go to sleep quickly.    He does have chronic shortness of breath with activity and that has not changed.  Smoked 18-50 - up to 4 packs a day  He denies any GERD.He typically does not have any seasonal allergies.   Medications and allergies reviewed with patient and updated if appropriate.  Patient Active Problem List   Diagnosis Date Noted  . Weight loss 11/16/2019  . Paroxysmal atrial fibrillation ([place]) 11/01/2019  . Tachycardia-bradycardia syndrome ([place]) 11/01/2019  . Dizziness 11/01/2019  . Caregiver stress 08/06/2019  . Aortic stenosis 07/31/2019  . Paroxysmal tachycardia, unspecified ([place]) 07/31/2019  . Coronary artery disease of native artery of native heart with stable angina pectoris ([place])   . Tick bite of right lower leg 04/27/2019  . Pneumonia   . Hypertension   . GERD (gastroesophageal reflux disease)   . Dyslipidemia   . Carotid arterial disease ([place])   . CAD (coronary artery disease) of artery bypass graft   . Dupuytren's contracture of left hand 07/20/2017  . Skin abnormality 07/20/2017  . 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 Hx of adenomatous colonic polyps 07/27/2011    . Carotid bruit 06/02/2011  . FASTING HYPERGLYCEMIA 11/10/2009  . Essential hypertension 04/29/2009  . CAD, ARTERY BYPASS GRAFT 04/29/2009  . MYOCARDIAL PERFUSION SCAN, WITH STRESS TEST, ABNORMAL 04/29/2009  . Hyperlipidemia 04/24/2009  . HYPERTROPHY PROSTATE W/UR OBST & OTH LUTS 04/22/2009    Current Outpatient Medications on File Prior to Visit  Medication Sig Dispense Refill  . apixaban (ELIQUIS) 5 MG TABS tablet Take 1 tablet (5 mg total) by mouth 2 (two) times daily. 60 tablet 11  . bimatoprost (LUMIGAN) 0.01 % SOLN Place 1 drop into both eyes at bedtime.    Marland Kitchen latanoprost (XALATAN) 0.005 % ophthalmic solution Place 1 drop into both eyes daily.    . simvastatin (ZOCOR) 20 MG tablet TAKE 1 TABLET BY MOUTH AT  BEDTIME 90 tablet 3  . triamcinolone ointment (KENALOG) 0.1 % Apply 1 application topically 2 (two) times daily as needed (rash).    . vitamin B-12 (CYANOCOBALAMIN) 1000 MCG tablet Take 1,000 mcg by mouth every evening.    . sertraline (ZOLOFT) 25 MG tablet Take 1 tablet (25 mg total) by mouth daily. (Patient not taking: Reported on 03/18/2020) 90 tablet 3   No current facility-administered medications on file prior to visit.    Past Medical History:  Diagnosis Date  . CAD (coronary artery disease) of artery  bypass graft   . Carotid arterial disease ([facility])    Nonobstructive  . Cataract   . Dyslipidemia   . GERD (gastroesophageal reflux disease)   . 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 Hyperlipidemia   . Hypertension   . Hypertrophy of prostate with urinary obstruction and other lower urinary tract symptoms (LUTS)   . Pneumonia    As a child  . Pneumonia    as child    Past Surgical History:  Procedure Laterality Date  . cataracts     both eyes  . COLONOSCOPY    . colonoscopy with polypectomy  2004  . CORONARY ARTERY BYPASS GRAFT  1995   X 7  . FASCIECTOMY Left 11/29/2017   Procedure: SEGMENTAL FASCIECTOMY LEFT SMALL FINGER;  Surgeon: Daryll Brod, MD;  Location: [facility];  Service: Orthopedics;  Laterality: Left;  block and MAC  . LEFT HEART CATH AND CORS/GRAFTS ANGIOGRAPHY N/A 07/25/2019   Procedure: LEFT HEART CATH AND CORS/GRAFTS ANGIOGRAPHY;  Surgeon: Burnell Blanks, MD;  Location: [facility];  Service: Cardiovascular;  Laterality: N/A;  . POLYPECTOMY      Social History   Socioeconomic History  . Marital status: Widowed    Spouse name: Not on file  . Number of children: 2  . Years of education: 16  . Highest education level: Bachelor's degree (e.g., BA, AB, BS)  Occupational History  . Occupation: Retired  Tobacco Use  . Smoking status: Former Smoker    Years: 30.00    Quit date: 10/18/1984    Years since quitting: 35.4  . Smokeless tobacco: Former Network engineer and Sexual Activity  . Alcohol use: Yes    Alcohol/week: 12.0 standard drinks    Types: 7 Glasses of wine, 5 Cans of beer per week  . Drug use: No  . Sexual activity: Not Currently  Other Topics Concern  . Not on file  Social History Narrative  . Not on file   Social Determinants of Health   Financial Resource Strain: Medium Risk  . Difficulty of Paying Living Expenses: Somewhat hard  Food Insecurity: No Food Insecurity  . Worried About Charity fundraiser in the Last Year: Never true  . Ran Out of Food in the Last Year: Never true  Transportation Needs: No Transportation Needs  . Lack of Transportation (Medical): No  . Lack of Transportation (Non-Medical): No  Physical Activity:   . Days of Exercise per Week:   . Minutes of Exercise per Session:   Stress: No Stress Concern Present  . Feeling of Stress : Not at all  Social Connections: Slightly Isolated  . Frequency of Communication with Friends and Family: More than three times a week  . Frequency of Social Gatherings with Friends and Family: More than three times a week  . Attends Religious Services: More than 4 times per year  . Active Member of Clubs or Organizations: Yes  .  Attends Archivist Meetings: More than 4 times per year  . Marital Status: Widowed    Family History  Problem Relation Age of Onset  . Stroke Father 5  . Diabetes Neg Hx   . Cancer Neg Hx   . GER disease Neg Hx   . Heart attack Neg Hx   . Colon cancer Neg Hx   . Esophageal cancer Neg Hx   . Stomach cancer Neg Hx   . Heart disease Neg Hx     Review of Systems  Constitutional: Negative for chills and fever.  HENT: Positive for congestion (in morning - mild) and rhinorrhea. Negative for sore throat.   Respiratory: Positive for cough (dry), shortness of breath (chronic - unchanged) and wheezing (once).   Cardiovascular: Positive for chest pain (occ).  Gastrointestinal:       No gerd  Neurological: Positive for headaches (in morning).       Objective:   Vitals:   03/26/20 1331  BP: 128/78  Pulse: 80  Temp: 98.3 F (36.8 C)  SpO2: 94%   BP Readings from Last 3 Encounters:  03/26/20 128/78  03/10/20 126/70  01/02/20 (!) 144/60   Wt Readings from Last 3 Encounters:  03/26/20 195 lb 6.4 oz (88.6 kg)  03/10/20 193 lb 6.4 oz (87.7 kg)  01/02/20 198 lb (89.8 kg)   Body mass index is 25.09 kg/m.   Physical Exam    GENERAL APPEARANCE: Appears stated age, well appearing, NAD EYES: conjunctiva clear, no icterus HEENT: oropharynx with no erythema, no thyromegaly, trachea midline, no cervical or supraclavicular lymphadenopathy LUNGS: Clear to auscultation without wheeze or crackles, unlabored breathing, good air entry bilaterally CARDIOVASCULAR: Normal S1,S2 without murmurs, no edema SKIN: Warm, dry  I did review his chest x-ray from today and it does appear that he probably has COPD although this is not diagnosed by chest x-ray.  Will wait for official read.  Reviewed his last chest x-ray in the system, which was over 10 years ago and there was a possibility of COPD  Assessment & Plan:    See Problem List for Assessment and Plan of chronic medical  problems.    This visit occurred during the SARS-CoV-2 public health emergency.  Safety protocols were in place, including screening questions prior to the visit, additional usage of staff PPE, and extensive cleaning of exam room while observing appropriate contact time as indicated for disinfecting solutions.

## 2020-03-26 ENCOUNTER — Encounter: Payer: Self-pay | Admitting: Internal Medicine

## 2020-03-26 ENCOUNTER — Ambulatory Visit (INDEPENDENT_AMBULATORY_CARE_PROVIDER_SITE_OTHER): Payer: Medicare Other

## 2020-03-26 ENCOUNTER — Other Ambulatory Visit: Payer: Self-pay

## 2020-03-26 ENCOUNTER — Ambulatory Visit (INDEPENDENT_AMBULATORY_CARE_PROVIDER_SITE_OTHER): Payer: Medicare Other | Admitting: Internal Medicine

## 2020-03-26 DIAGNOSIS — R05 Cough: Secondary | ICD-10-CM

## 2020-03-26 DIAGNOSIS — R059 Cough, unspecified: Secondary | ICD-10-CM | POA: Insufficient documentation

## 2020-03-26 DIAGNOSIS — I2510 Atherosclerotic heart disease of native coronary artery without angina pectoris: Secondary | ICD-10-CM | POA: Diagnosis not present

## 2020-03-26 MED ORDER — LEVOCETIRIZINE DIHYDROCHLORIDE 5 MG PO TABS: 5.0000 mg | ORAL_TABLET | Freq: Every evening | ORAL | 2 refills | Status: DC

## 2020-03-26 NOTE — Patient Instructions (Addendum)
Have a chest xray today downstairs.  Start xyzal (allergy medication) at bedtime for allergies/post nasal drip.    If your cough persists we can consider a medication for silent heartburn or seeing pulmonary.

## 2020-03-26 NOTE — Assessment & Plan Note (Addendum)
Acute Worse in am and at night when he lays down Unlikely infection, typically does not get seasonal allergies, no gerd ? COPD vs silent gerd vs allergies cxr today Start xyzal at bedtime Denies GERD, but may need a trial of Pepcid depending on chest x-ray and response to Xyzal Consider pulm referral

## 2020-03-27 ENCOUNTER — Encounter: Payer: Self-pay | Admitting: Internal Medicine

## 2020-03-27 DIAGNOSIS — I700 Atherosclerosis of aorta: Secondary | ICD-10-CM | POA: Insufficient documentation

## 2020-04-06 ENCOUNTER — Encounter: Payer: Self-pay | Admitting: Internal Medicine

## 2020-04-06 DIAGNOSIS — R059 Cough, unspecified: Secondary | ICD-10-CM

## 2020-05-01 ENCOUNTER — Other Ambulatory Visit: Payer: Self-pay

## 2020-05-01 ENCOUNTER — Ambulatory Visit (INDEPENDENT_AMBULATORY_CARE_PROVIDER_SITE_OTHER): Payer: Medicare Other | Admitting: Emergency Medicine

## 2020-05-01 ENCOUNTER — Encounter: Payer: Self-pay | Admitting: Emergency Medicine

## 2020-05-01 DIAGNOSIS — I2510 Atherosclerotic heart disease of native coronary artery without angina pectoris: Secondary | ICD-10-CM | POA: Diagnosis not present

## 2020-05-01 DIAGNOSIS — R0602 Shortness of breath: Secondary | ICD-10-CM

## 2020-05-01 DIAGNOSIS — J449 Chronic obstructive pulmonary disease, unspecified: Secondary | ICD-10-CM | POA: Insufficient documentation

## 2020-05-01 DIAGNOSIS — R05 Cough: Secondary | ICD-10-CM | POA: Diagnosis not present

## 2020-05-01 DIAGNOSIS — R059 Cough, unspecified: Secondary | ICD-10-CM

## 2020-05-01 NOTE — Progress Notes (Signed)
Subjective:    Patient ID: Fernando Walker, male    DOB: 1933-06-30, 86 y.o.   MRN: [id number]  HPI 86 year old former smoker (100 pk-yrs) with a history of CAD/CABG (95), atrial fibrillation on Eliquis, GERD, hypertension, BPH, childhood pneumonias.  Referred today for evaluation of coughing.  This began about early June he developed cough, heard some wheezing. The cough was non-productive. He had an associated globus sensation - now resolved. He occasionally has some choking from saliva, taking PO. No clear inciting event or URI.  No overt GERD symptoms, not on therapy.  He was started on Xyzal about 1 month ago which did improve nasal congestion and drainage but the cough did not resolve with this.  He has since stopped it.  He is not on an ACE inhibitor. The cough has largely gone away now - has been better for about a week. His nasal gtt has not returned.  He has exertional SOB, has been present for over a year. Walk distance has decreased. Cannot do yard work easily anymore.   Left heart catheterization 07/25/2019 showed chronic occlusion of his SVG to RV marginal but otherwise patent grafts.  Echocardiogram 07/20/2019 with normal LVEF.  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   Hyperlipidemia    Hypertension    Hypertrophy of prostate with urinary obstruction and other lower urinary tract symptoms (LUTS)    Pneumonia    As a child   Pneumonia    as child     Family History  Problem Relation Age of Onset   Stroke Father 32   Diabetes Neg Hx    Cancer Neg Hx    GER disease Neg Hx    Heart attack Neg Hx    Colon cancer Neg Hx     Esophageal cancer Neg Hx    Stomach cancer Neg Hx    Heart disease Neg Hx      Social History   Socioeconomic History   Marital status: Widowed    Spouse name: Not on file   Number of children: 2   Years of education: 16   Highest education level: Bachelor's degree (e.g., BA, AB, BS)  Occupational History   Occupation: Retired  Tobacco Use   Smoking status: Former Smoker    Packs/day: 4.00    Years: 30.00    Pack years: 120.00    Quit date: 10/18/1984    Years since quitting: 35.5   Smokeless tobacco: Former Counsellor Use: Never used  Substance and Sexual Activity   Alcohol use: Yes    Alcohol/week: 12.0 standard drinks    Types: 7 Glasses of wine, 5 Cans of beer per week   Drug use: No   Sexual activity: Not Currently  Other Topics Concern   Not on file  Social History Narrative   Not on file   Social Determinants of Health   Financial Resource Strain: Medium Risk   Difficulty of Paying Living Expenses: Somewhat hard  Food Insecurity: No Food Insecurity   Worried About Running Out of Food in the Last Year: Never true   Ran  Out of Food in the Last Year: Never true  Transportation Needs: No Transportation Needs   Lack of Transportation (Medical): No   Lack of Transportation (Non-Medical): No  Physical Activity:    Days of Exercise per Week:    Minutes of Exercise per Session:   Stress: No Stress Concern Present   Feeling of Stress : Not at all  Social Connections: Moderately Integrated   Frequency of Communication with Friends and Family: More than three times a week   Frequency of Social Gatherings with Friends and Family: More than three times a week   Attends Religious Services: More than 4 times per year   Active Member of [employer] or Organizations: Yes   Attends Archivist Meetings: More than 4 times per year   Marital Status: Widowed  Intimate Partner Violence: Unknown   Fear of Current or Ex-Partner:  Patient refused   Emotionally Abused: Patient refused   Physically Abused: Patient refused   Sexually Abused: Patient refused    Worked in Nurse, children's, 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  No inhaled exposure from work He has done woodworking with wood dust exposure.  Was in the [employer] - no exposures.  [place] native.   Allergies  Allergen Reactions   Sulfonamide Derivatives Rash     Outpatient Medications Prior to Visit  Medication Sig Dispense Refill   apixaban (ELIQUIS) 5 MG TABS tablet Take 1 tablet (5 mg total) by mouth 2 (two) times daily. 60 tablet 11   bimatoprost (LUMIGAN) 0.01 % SOLN Place 1 drop into both eyes at bedtime.     latanoprost (XALATAN) 0.005 % ophthalmic solution Place 1 drop into both eyes daily.     simvastatin (ZOCOR) 20 MG tablet TAKE 1 TABLET BY MOUTH AT  BEDTIME 90 tablet 3   triamcinolone ointment (KENALOG) 0.1 % Apply 1 application topically 2 (two) times daily as needed (rash).     vitamin B-12 (CYANOCOBALAMIN) 1000 MCG tablet Take 1,000 mcg by mouth every evening.     levocetirizine (XYZAL) 5 MG tablet Take 1 tablet (5 mg total) by mouth every evening. (Patient not taking: Reported on 05/01/2020) 30 tablet 2   No facility-administered medications prior to visit.       Objective:   Physical Exam Vitals:   05/01/20 1449  BP: 132/64  Pulse: 82  Temp: 98.3 F (36.8 C)  TempSrc: Oral  SpO2: 94%  Weight: 193 lb 3.2 oz (87.6 kg)  Height: 6\' 2"  (1.88 m)    Gen: Pleasant, elderly gentleman, well-nourished, in no distress,  normal affect  ENT: No lesions,  mouth clear,  oropharynx clear, no postnasal drip  Neck: No JVD, no stridor  Lungs: No use of accessory muscles, no crackles or wheezing on normal respiration, no wheeze on forced expiration  Cardiovascular: RRR, heart sounds normal, no murmur or gallops, no peripheral edema  Musculoskeletal: No deformities, no cyanosis or clubbing  Neuro: alert, awake, non  focal  Skin: Warm, no lesions or rash       Assessment & Plan:  Cough This is largely resolved.  Sounds like there was at least a component of postnasal drip contributing to upper airway irritation and globus sensation.  I do not know if he had a URI or if this was related to allergic exposure.  He did improve after Xyzal.  He is now off this medication.  No overt GERD symptoms.  Certainly if he has obstructive lung disease cough can be associated with this as well.  We're going to define his degree of obstruction with PFT  Dyspnea He has underlying cardiac disease but this is well compensated.  I suspect that he does have some degree of COPD that has been limiting him.  Heavy smoking history.  We will perform PFT and depending on the results consider scheduled bronchodilator therapy to see if he gets clinical benefit.   Baltazar Apo, MD, PhD 05/01/2020, 3:12 PM [facility] [phone] or if no answer [phone]

## 2020-05-01 NOTE — Addendum Note (Signed)
Addended by: Gavin Potters R on: 05/01/2020 03:37 PM   Modules accepted: Orders

## 2020-05-01 NOTE — Assessment & Plan Note (Signed)
This is largely resolved.  Sounds like there was at least a component of postnasal drip contributing to upper airway irritation and globus sensation.  I do not know if he had a URI or if this was related to allergic exposure.  He did improve after Xyzal.  He is now off this medication.  No overt GERD symptoms.  Certainly if he has obstructive lung disease cough can be associated with this as well.  We're going to define his degree of obstruction with PFT

## 2020-05-01 NOTE — Patient Instructions (Signed)
Okay to stay off Xyzal for now.  We may decide to restart if your nasal drainage or cough return, increase. We will perform full pulmonary function testing at your next office visit. We will repeat your chest x-ray in 1 year Follow with Dr. Lamonte Sakai next available with full pulmonary function testing on the same day.

## 2020-05-01 NOTE — Assessment & Plan Note (Signed)
He has underlying cardiac disease but this is well compensated.  I suspect that he does have some degree of COPD that has been limiting him.  Heavy smoking history.  We will perform PFT and depending on the results consider scheduled bronchodilator therapy to see if he gets clinical benefit.

## 2020-05-08 ENCOUNTER — Other Ambulatory Visit: Payer: Self-pay | Admitting: *Deleted

## 2020-05-08 MED ORDER — APIXABAN 5 MG PO TABS: 5.0000 mg | ORAL_TABLET | Freq: Two times a day (BID) | ORAL | 1 refills | Status: DC

## 2020-05-08 NOTE — Telephone Encounter (Signed)
Eliquis 5mg  paper refill request received. Patient is 86 years old, weight-87.6kg, Crea-0.94 on 11/16/2019, Diagnosis-Afib, and last seen by Dr. Angelena Form on 01/02/2020. Dose is appropriate based on dosing criteria. Will send in refill to requested pharmacy.

## 2020-05-15 ENCOUNTER — Institutional Professional Consult (permissible substitution): Payer: Medicare Other | Admitting: Emergency Medicine

## 2020-06-18 ENCOUNTER — Other Ambulatory Visit: Payer: Self-pay

## 2020-06-18 ENCOUNTER — Ambulatory Visit: Payer: Medicare Other | Admitting: Pharmacist

## 2020-06-18 DIAGNOSIS — I10 Essential (primary) hypertension: Secondary | ICD-10-CM

## 2020-06-18 DIAGNOSIS — E785 Hyperlipidemia, unspecified: Secondary | ICD-10-CM

## 2020-06-18 DIAGNOSIS — I480 Paroxysmal atrial fibrillation: Secondary | ICD-10-CM

## 2020-06-18 DIAGNOSIS — I25118 Atherosclerotic heart disease of native coronary artery with other forms of angina pectoris: Secondary | ICD-10-CM

## 2020-06-18 NOTE — Patient Instructions (Addendum)
Visit Information  Phone number for Pharmacist: [phone]  Goals Addressed            This Visit's Progress   . Pharmacy Care Plan       CARE PLAN ENTRY  Current Barriers:  . Chronic Disease Management support, education, and care coordination needs related to Hypertension, Hyperlipidemia, Atrial Fibrillation, and Depression/Grief , Glaucoma   Hypertension - Blood Pressure BP Readings from Last 3 Encounters:  03/10/20 126/70  01/02/20 (!) 144/60  11/16/19 (!) 152/76 .  Pharmacist Clinical Goal(s): o Over the next 180 days, patient will work with PharmD and providers to maintain BP goal 110/60 - 140/90 . Current regimen:  o No medications . Interventions: o Discussed possibility of orthostatic hypotension - BP drops after standing, causing dizziness/lightheadedness o Discussed importance of hydration and adequate nutrition to maintain BP . Patient self care activities - Over the next 180 days, patient will: o Check BP 1-2x weekly, document, and provide at future appointments o Ensure daily salt intake < 2300 mg/day o Maintain adequate fluid intake to keep BP stable  Hyperlipidemia/Heart Disease Lab Results  Component Value Date/Time   LDLCALC 67 01/22/2019 08:01 AM .  Pharmacist Clinical Goal(s): o Over the next 180 days, patient will work with PharmD and providers to maintain LDL goal < 70 . Current regimen:  o Simvastatin 20 mg at bedtime o Nitroglycerin 0.4 mg as needed . Interventions: o Discussed benefits of statin for maintaining cholesterol at goal and preventing heart attack/stroke . Patient self care activities - Over the next 180 days, patient will: o Take medication as prescribed  Atrial Fibrillation . Pharmacist Clinical Goal(s) o Over the next 180 days, patient will work with PharmD and providers to optimize anticoagulation therapy . Current regimen:  o Eliquis 5 mg BID . Interventions: o Discussed benefits of Eliquis for stroke  prevention o Discussed bleeding risk with Eliquis and drug interactions with OTC pain meds o Discussed cost of Eliquis is currently reasonable . Patient self care activities - Over the next 180 days, patient will: o Monitor for signs and symptoms of bleeding o Avoid OTC NSAIDs for pain (ibuprofen, naproxen)  Depression/Anxiety . Pharmacist Clinical Goal(s) o Over the next 180 days, patient will work with PharmD and providers to optimize antidepressant therapy . Current regimen:  o No medications . Interventions: o Discussed that sertraline can be restarted at any time if depression or anxiety symptoms worsen . Patient self care activities - Over the next 180 days, patient will: o Interior and spatial designer and/or provider with issues or concerns  Glaucoma . Pharmacist Clinical Goal(s) o Over the next 180 days, patient will work with PharmD and providers to reduce costs for eye drops . Current regimen:  o Latanoprost 0.005% eye drops . Interventions: o Requested switch from Lumigan to latanoprost for cost savings per Dr Oneida Arenas . Patient self care activities - Over the next 180 days, patient will: o Continue Lumigan eye drops until Latanoprost drops are delivered  Medication management . Pharmacist Clinical Goal(s): o Over the next 180 days, patient will work with PharmD and providers to achieve optimal medication adherence . Current pharmacy: [facility] order, [facility] . Interventions o Comprehensive medication review performed. o Continue current medication management strategy . Patient self care activities - Over the next 180 days, patient will: o Focus on medication adherence by fill date o Take medications as prescribed o Report any questions or concerns to PharmD and/or provider(s)  Please see past updates related to  this goal by clicking on the "Past Updates" button in the selected goal        Patient verbalizes understanding of instructions provided today.  Telephone  follow up appointment with pharmacy team member scheduled for: 6 months  Charlene Brooke, PharmD, BCACP Clinical Pharmacist [facility] [place] Maintenance After Age 39 After age 68, you are at a higher risk for certain long-term diseases and infections as well as injuries from falls. Falls are a major cause of broken bones and head injuries in people who are older than age 71. Getting regular preventive care can help to keep you healthy and well. Preventive care includes getting regular testing and making lifestyle changes as recommended by your health care provider. Talk with your health care provider about:  Which screenings and tests you should have. A screening is a test that checks for a disease when you have no symptoms.  A diet and exercise plan that is right for you. What should I know about screenings and tests to prevent falls? Screening and testing are the best ways to find a health problem early. Early diagnosis and treatment give you the best chance of managing medical conditions that are common after age 69. Certain conditions and lifestyle choices may make you more likely to have a fall. Your health care provider may recommend:  Regular vision checks. Poor vision and conditions such as cataracts can make you more likely to have a fall. If you wear glasses, make sure to get your prescription updated if your vision changes.  Medicine review. Work with your health care provider to regularly review all of the medicines you are taking, including over-the-counter medicines. Ask your health care provider about any side effects that may make you more likely to have a fall. Tell your health care provider if any medicines that you take make you feel dizzy or sleepy.  Osteoporosis screening. Osteoporosis is a condition that causes the bones to get weaker. This can make the bones weak and cause them to break more easily.  Blood pressure screening.  Blood pressure changes and medicines to control blood pressure can make you feel dizzy.  Strength and balance checks. Your health care provider may recommend certain tests to check your strength and balance while standing, walking, or changing positions.  Foot health exam. Foot pain and numbness, as well as not wearing proper footwear, can make you more likely to have a fall.  Depression screening. You may be more likely to have a fall if you have a fear of falling, feel emotionally low, or feel unable to do activities that you used to do.  Alcohol use screening. Using too much alcohol can affect your balance and may make you more likely to have a fall. What actions can I take to lower my risk of falls? General instructions  Talk with your health care provider about your risks for falling. Tell your health care provider if: ? You fall. Be sure to tell your health care provider about all falls, even ones that seem minor. ? You feel dizzy, sleepy, or off-balance.  Take over-the-counter and prescription medicines only as told by your health care provider. These include any supplements.  Eat a healthy diet and maintain a healthy weight. A healthy diet includes low-fat dairy products, low-fat (lean) meats, and fiber from whole grains, beans, and lots of fruits and vegetables. Home safety  Remove any tripping hazards, such as rugs, cords, and clutter.  Install safety  equipment such as grab bars in bathrooms and safety rails on stairs.  Keep rooms and walkways well-lit. Activity   Follow a regular exercise program to stay fit. This will help you maintain your balance. Ask your health care provider what types of exercise are appropriate for you.  If you need a cane or walker, use it as recommended by your health care provider.  Wear supportive shoes that have nonskid soles. Lifestyle  Do not drink alcohol if your health care provider tells you not to drink.  If you drink alcohol, limit  how much you have: ? 0-1 drink a day for women. ? 0-2 drinks a day for men.  Be aware of how much alcohol is in your drink. In the [place] one drink equals one typical bottle of beer (12 oz), one-half glass of wine (5 oz), or one shot of hard liquor (1 oz).  Do not use any products that contain nicotine or tobacco, such as cigarettes and e-cigarettes. If you need help quitting, ask your health care provider. Summary  Having a healthy lifestyle and getting preventive care can help to protect your health and wellness after age 73.  Screening and testing are the best way to find a health problem early and help you avoid having a fall. Early diagnosis and treatment give you the best chance for managing medical conditions that are more common for people who are older than age 41.  Falls are a major cause of broken bones and head injuries in people who are older than age 58. Take precautions to prevent a fall at home.  Work with your health care provider to learn what changes you can make to improve your health and wellness and to prevent falls. This information is not intended to replace advice given to you by your health care provider. Make sure you discuss any questions you have with your health care provider. Document Revised: 01/25/2019 Document Reviewed: 08/17/2017 Elsevier Patient Education  2020 [employer].

## 2020-06-18 NOTE — Chronic Care Management (AMB) (Addendum)
Chronic Care Management Pharmacy  Name: Fernando Walker  MRN: [id number] DOB: 04/28/1933   Chief Complaint/ HPI  Fernando Walker,  86 y.o. , male presents for their Follow-Up CCM visit with the clinical pharmacist via telephone due to COVID-19 Pandemic.  PCP : Binnie Rail, MD  Their chronic conditions include: HTN, CAD (CABG 31-Dec-1993), Afib, HLD, BPH, GERD  Pt reports his wife of 69 years passed away in 01/01/23. He is grieving but his family and friends have been visiting him and getting him through.    Office Visits: 03/26/20 Dr Quay Burow OV: c/o cough. Unlikely infection, started Xyzal. CXR showed COPD, referred to pulm. Discontinued sertraline.  03/10/20 AWV 11/16/19 Dr Quay Burow: acute visit for unexplained weight loss. Wife recently moved to SNF. Checked labs, all normal/unchanged from previous. Pt to continue checking weights at home, appears stabilized.  08/06/19 Dr Quay Burow: pt experiencing caregiver stress, started sertraline 25 mg daily.    Consult Visit: 05/01/20 Dr Lamonte Sakai (pulmonary): initial eval for cough, SOB. Cough resolved on its own. Suspect COPD,  Ordered PFT.  04/08/20 Dr Gershon Crane (eye): will switch to latanoprost when Lumigan runs out.  01/02/20 Dr Angelena Form (cardiology): pt stable, no med changes.  11/01/19 Dr Lovena Le (cardiology EP): pt reports dizziness, etiology unclear, doubt related to Afib. Rec'd watchful waiting. No med changes.   10/03/19 NP (cardiology): No med changes, referred to EP for bradycardia.   08/24/19 Dr Angelena Form (cardiology): Pt returned Holter monitor. HR 39 at night, Afib rates to 180 bpm. Start Eliquis. Use metoprolol prn for palpitations (could not tolerate daily due to dizziness). Referred to EP  Medications: Outpatient Encounter Medications as of 06/18/2020  Medication Sig   apixaban (ELIQUIS) 5 MG TABS tablet Take 1 tablet (5 mg total) by mouth 2 (two) times daily.   bimatoprost (LUMIGAN) 0.01 % SOLN Place 1 drop into both eyes at bedtime.    latanoprost (XALATAN) 0.005 % ophthalmic solution Place 1 drop into both eyes daily.   levocetirizine (XYZAL) 5 MG tablet Take 1 tablet (5 mg total) by mouth every evening.   simvastatin (ZOCOR) 20 MG tablet TAKE 1 TABLET BY MOUTH AT  BEDTIME   triamcinolone ointment (KENALOG) 0.1 % Apply 1 application topically 2 (two) times daily as needed (rash).   vitamin B-12 (CYANOCOBALAMIN) 1000 MCG tablet Take 1,000 mcg by mouth every evening.   No facility-administered encounter medications on file as of 06/18/2020.     Current Diagnosis/Assessment:    Goals Addressed            This Visit's Progress    Pharmacy Care Plan       CARE PLAN ENTRY  Current Barriers:   Chronic Disease Management support, education, and care coordination needs related to Hypertension, Hyperlipidemia, Atrial Fibrillation, and Depression/Grief , Glaucoma   Hypertension - Blood Pressure BP Readings from Last 3 Encounters:  03/10/20 126/70  01/02/20 (!) 144/60  11/16/19 (!) 152/76   Pharmacist Clinical Goal(s): o Over the next 180 days, patient will work with PharmD and providers to maintain BP goal 110/60 - 140/90  Current regimen:  o No medications  Interventions: o Discussed possibility of orthostatic hypotension - BP drops after standing, causing dizziness/lightheadedness o Discussed importance of hydration and adequate nutrition to maintain BP  Patient self care activities - Over the next 180 days, patient will: o Check BP 1-2x weekly, document, and provide at future appointments o Ensure daily salt intake < 2300 mg/day o Maintain adequate fluid intake to keep  BP stable  Hyperlipidemia/Heart Disease Lab Results  Component Value Date/Time   LDLCALC 67 01/22/2019 08:01 AM   Pharmacist Clinical Goal(s): o Over the next 180 days, patient will work with PharmD and providers to maintain LDL goal < 70  Current regimen:  o Simvastatin 20 mg at bedtime o Nitroglycerin 0.4 mg as  needed  Interventions: o Discussed benefits of statin for maintaining cholesterol at goal and preventing heart attack/stroke  Patient self care activities - Over the next 180 days, patient will: o Take medication as prescribed  Atrial Fibrillation  Pharmacist Clinical Goal(s) o Over the next 180 days, patient will work with PharmD and providers to optimize anticoagulation therapy  Current regimen:  o Eliquis 5 mg BID  Interventions: o Discussed benefits of Eliquis for stroke prevention o Discussed bleeding risk with Eliquis and drug interactions with OTC pain meds o Discussed cost of Eliquis is currently reasonable  Patient self care activities - Over the next 180 days, patient will: o Monitor for signs and symptoms of bleeding o Avoid OTC NSAIDs for pain (ibuprofen, naproxen)  Depression/Anxiety  Pharmacist Clinical Goal(s) o Over the next 180 days, patient will work with PharmD and providers to optimize antidepressant therapy  Current regimen:  o No medications  Interventions: o Discussed that sertraline can be restarted at any time if depression or anxiety symptoms worsen  Patient self care activities - Over the next 180 days, patient will: IT consultant and/or provider with issues or concerns  Glaucoma  Pharmacist Clinical Goal(s) o Over the next 180 days, patient will work with PharmD and providers to reduce costs for eye drops  Current regimen:  o Latanoprost 0.005% eye drops  Interventions: o Requested switch from Lumigan to latanoprost for cost savings per Dr Madison Hickman  Patient self care activities - Over the next 180 days, patient will: o Continue Lumigan eye drops until Latanoprost drops are delivered  Medication management  Pharmacist Clinical Goal(s): o Over the next 180 days, patient will work with PharmD and providers to achieve optimal medication adherence  Current pharmacy: [facility]  Interventions o Comprehensive  medication review performed. o Continue current medication management strategy  Patient self care activities - Over the next 180 days, patient will: o Focus on medication adherence by fill date o Take medications as prescribed o Report any questions or concerns to PharmD and/or provider(s)  Please see past updates related to this goal by clicking on the "Past Updates" button in the selected goal         AFIB / Hypertension   Patient is currently rate controlled. Checks HR at home with pulse ox Pulse Readings from Last 3 Encounters:  05/01/20 82  03/26/20 80  03/10/20 60   Office blood pressures are  BP Readings from Last 3 Encounters:  05/01/20 132/64  03/26/20 128/78  03/10/20 126/70   Patient checks BP at home infrequently Patient home BP readings are ranging: n/a  Patient has failed these meds in past: trandolapril Patient is currently controlled on the following medications:   Eliquis 5 mg BID  We discussed: Pt c/o occasional dizziness, much improved since ensuring adequate hydration and standing slowly to prevent orthostasis. He goes through 16 oz water bottle 3-4 times daily.   Plan  Continue current medications  Ensure adequate fluid intake  Hyperlipidemia / CAD   LDL goal < 70 CAD - hx CABG 1995  Lipid Panel     Component Value Date/Time   CHOL 142  01/22/2019 0801   TRIG 56 01/22/2019 0801   HDL 64 01/22/2019 0801   CHOLHDL 2.2 01/22/2019 0801   CHOLHDL 2 08/27/2016 1007   VLDL 11.6 08/27/2016 1007   LDLCALC 67 01/22/2019 0801   LABVLDL 11 01/22/2019 0801   Hepatic Function Latest Ref Rng & Units 11/16/2019 01/22/2019 01/18/2018  Total Protein 6.0 - 8.3 g/dL 7.3 6.5 6.6  Albumin 3.5 - 5.2 g/dL 4.1 4.3 4.1  AST 0 - 37 U/L $Remo'18 21 18  'XFNKM$ ALT 0 - 53 U/L $Remo'20 19 18  'jDSFI$ Alk Phosphatase 39 - 117 U/L 52 41 45  Total Bilirubin 0.2 - 1.2 mg/dL 1.1 1.2 0.7  Bilirubin, Direct 0.00 - 0.40 mg/dL - - 0.24   Patient has failed these meds in past: n/a Patient is  currently controlled on the following medications:   simvastatin 20 mg HS,   nitroglycerin 0.4 mg SL prn  We discussed:  Cholesterol goals; benefits of simvastatin for ASCVD risk reduction  Plan  Continue current medications   Depression/anxiety   Depression screen [facility] 2/9 08/06/2019 03/06/2019 02/24/2018  Decreased Interest 3 0 0  Down, Depressed, Hopeless 2 2 0  PHQ - 2 Score 5 2 0  Altered sleeping 1 0 0  Tired, decreased energy 1 1 0  Change in appetite 0 0 0  Feeling bad or failure about yourself  0 0 0  Trouble concentrating 0 0 0  Moving slowly or fidgety/restless 1 0 0  Suicidal thoughts 1 0 0  PHQ-9 Score 9 3 0  Difficult doing work/chores - Not difficult at all Not difficult at all   Patient has failed these meds in past: hydroxyzine, sertraline Patient is currently controlled on the following medications:   No medications  We discussed: Pt has stayed off of sertraline for last few months and denies issues.   Plan  Continue to monitor for depressive symptoms  Glaucoma   Managed per Dr Gershon Crane  Patient has failed these meds in past: Lumigan (cost) Patient is currently controlled on the following medications:   Latanoprost 0.005% eye drops - HS  We discussed: last visit coordinated with eye doctor to switch Lumigan to latanoprost for cost savings; pt still had 3 month supply of Lumigan but is now filling latanoprost for <$8/3 months.  Plan  Continue current medications  Vaccines   Reviewed and discussed patient's vaccination history.   Patient reports he received COVID-19 vaccine: -Pfizer vaccine, 1st dose 11/09/19, 2nd dose 11/30/19  Immunization History  Administered Date(s) Administered   Fluad Quad(high Dose 65+) 07/10/2019   Influenza Split 07/27/2011, 08/03/2012   Influenza Whole 09/07/2007, 07/24/2008, 07/23/2009, 08/07/2010   Influenza, High Dose Seasonal PF 08/01/2013, 07/20/2017, 07/04/2018   Influenza,inj,Quad PF,6+ Mos 06/26/2014,  08/04/2015   Pneumococcal Conjugate-13 11/03/2015   Pneumococcal Polysaccharide-23 09/13/2012   Tdap 04/27/2019    Plan  Patient is up-to-date with vaccinations  Medication Management   [facility] uses [facility] for Eliquis, all others from [employer] Uses 2 pill boxes - AM and PM boxes Pt endorses 100% compliance  We discussed: Pt uses mail order for all meds at no cost, except he found Eliquis is cheaper at [employer].   Plan  Continue current medications    Follow up: 6 month phone visit  Charlene Brooke, PharmD, [facility] Clinical Pharmacist [facility] [phone]

## 2020-06-26 ENCOUNTER — Ambulatory Visit (INDEPENDENT_AMBULATORY_CARE_PROVIDER_SITE_OTHER): Payer: Medicare Other | Admitting: Emergency Medicine

## 2020-06-26 ENCOUNTER — Encounter: Payer: Self-pay | Admitting: Emergency Medicine

## 2020-06-26 ENCOUNTER — Other Ambulatory Visit: Payer: Self-pay

## 2020-06-26 DIAGNOSIS — I2510 Atherosclerotic heart disease of native coronary artery without angina pectoris: Secondary | ICD-10-CM | POA: Diagnosis not present

## 2020-06-26 DIAGNOSIS — J449 Chronic obstructive pulmonary disease, unspecified: Secondary | ICD-10-CM

## 2020-06-26 DIAGNOSIS — R0602 Shortness of breath: Secondary | ICD-10-CM | POA: Diagnosis not present

## 2020-06-26 DIAGNOSIS — R05 Cough: Secondary | ICD-10-CM

## 2020-06-26 DIAGNOSIS — R059 Cough, unspecified: Secondary | ICD-10-CM

## 2020-06-26 LAB — PULMONARY FUNCTION TEST
DL/VA % pred: 92 %
DL/VA: 3.45 ml/min/mmHg/L
DLCO cor % pred: 72 %
DLCO cor: 20 ml/min/mmHg
DLCO unc % pred: 72 %
DLCO unc: 20 ml/min/mmHg
FEF 25-75 Post: 1.34 L/sec
FEF 25-75 Pre: 1.01 L/sec
FEF2575-%Change-Post: 32 %
FEF2575-%Pred-Post: 60 %
FEF2575-%Pred-Pre: 45 %
FEV1-%Change-Post: 7 %
FEV1-%Pred-Post: 62 %
FEV1-%Pred-Pre: 58 %
FEV1-Post: 2.09 L
FEV1-Pre: 1.93 L
FEV1FVC-%Change-Post: 0 %
FEV1FVC-%Pred-Pre: 91 %
FEV6-%Change-Post: 8 %
FEV6-%Pred-Post: 72 %
FEV6-%Pred-Pre: 67 %
FEV6-Post: 3.2 L
FEV6-Pre: 2.95 L
FEV6FVC-%Change-Post: 1 %
FEV6FVC-%Pred-Post: 106 %
FEV6FVC-%Pred-Pre: 105 %
FVC-%Change-Post: 7 %
FVC-%Pred-Post: 68 %
FVC-%Pred-Pre: 63 %
FVC-Post: 3.2 L
FVC-Pre: 2.99 L
Post FEV1/FVC ratio: 65 %
Post FEV6/FVC ratio: 100 %
Pre FEV1/FVC ratio: 65 %
Pre FEV6/FVC Ratio: 99 %
RV % pred: 145 %
RV: 4.42 L
TLC % pred: 95 %
TLC: 7.68 L

## 2020-06-26 MED ORDER — ALBUTEROL SULFATE HFA 108 (90 BASE) MCG/ACT IN AERS: 2.0000 | INHALATION_SPRAY | Freq: Four times a day (QID) | RESPIRATORY_TRACT | 6 refills | Status: DC | PRN

## 2020-06-26 NOTE — Assessment & Plan Note (Signed)
Moderately severe to severe obstruction based on his pulmonary function testing from today, although his clinical status certainly does not reflect this.  We talked about possibly starting scheduled bronchodilator therapy, possible Spiriva, but he is not sure that his breathing is negatively impacting him enough to be on an every day medication.  We will give him albuterol to use as needed, hold off on schedule BD for now.  If/when we do start scheduled medication will need to be mindful of the fact that he has a history of BPH, watch for any side effects of urinary retention.

## 2020-06-26 NOTE — Addendum Note (Signed)
Addended by: Vanessa Barbara on: 06/26/2020 12:03 PM   Modules accepted: Orders

## 2020-06-26 NOTE — Assessment & Plan Note (Signed)
Resolved.  He was off Xyzal when I met him in July..  Discussed surveillance for any worsening of allergic rhinitis or cough.  If so he will restart antihistamine. 

## 2020-06-26 NOTE — Progress Notes (Signed)
Full PFT performed today. °

## 2020-06-26 NOTE — Progress Notes (Signed)
Subjective:    Patient ID: Fernando Walker, male    DOB: 11-13-1932, 86 y.o.   MRN: [id number]  HPI 86 year old former smoker (100 pk-yrs) with a history of CAD/CABG (95), atrial fibrillation on Eliquis, GERD, hypertension, BPH, childhood pneumonias.  Referred today for evaluation of coughing.  This began about early June he developed cough, heard some wheezing. The cough was non-productive. He had an associated globus sensation - now resolved. He occasionally has some choking from saliva, taking PO. No clear inciting event or URI.  No overt GERD symptoms, not on therapy.  He was started on Xyzal about 1 month ago which did improve nasal congestion and drainage but the cough did not resolve with this.  He has since stopped it.  He is not on an ACE inhibitor. The cough has largely gone away now - has been better for about a week. His nasal gtt has not returned.  He has exertional SOB, has been present for over a year. Walk distance has decreased. Cannot do yard work easily anymore.   Left heart catheterization 07/25/2019 showed chronic occlusion of his SVG to RV marginal but otherwise patent grafts.  Echocardiogram 07/20/2019 with normal LVEF.  CXR 03/27/20 reviewed by me, shows some L pleural thickening, peribronchial thickening,   ROV 06/26/20 --follow-up visit for 86 year old former heavy smoker with CAD/CABG, A. fib on anticoagulation, GERD, hypertension, BPH, childhood pneumonias.  I saw him in July for coughing and some associated wheezing, dyspnea that started the month prior, associated with a globus sensation.  Seem to the impacted limb part by postnasal drainage, better after Xyzal (now stopped).  He underwent pulmonary function testing today which I have reviewed, shows moderately severe to severe obstruction without a bronchodilator response, hyperinflated volumes based on elevated RV, slightly decreased diffusion capacity that corrects to the normal range when adjusted for his alveolar  volume. He is still having dyspnea with walking outside, recovers after about 5 minutes.    Review of Systems As per HPI  Past Medical History:  Diagnosis Date  . CAD (coronary artery disease) of artery bypass graft   . Carotid arterial disease (HCC)    Nonobstructive  . Cataract   . Dyslipidemia   . GERD (gastroesophageal reflux disease)   . 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 Hyperlipidemia   . Hypertension   . Hypertrophy of prostate with urinary obstruction and other lower urinary tract symptoms (LUTS)   . Pneumonia    As a child  . Pneumonia    as child     Family History  Problem Relation Age of Onset  . Stroke Father 73  . Diabetes Neg Hx   . Cancer Neg Hx   . GER disease Neg Hx   . Heart attack Neg Hx   . Colon cancer Neg Hx   . Esophageal cancer Neg Hx   . Stomach cancer Neg Hx   . Heart disease Neg Hx      Social History   Socioeconomic History  . Marital status: Widowed    Spouse name: Not on file  . Number of children: 2  . Years of education: 16  . Highest education level: Bachelor's degree (e.g., BA, AB, BS)  Occupational History  . Occupation: Retired  Tobacco Use  . Smoking status: Former Smoker    Packs/day: 4.00    Years: 30.00    Pack years: 120.00    Quit date: 10/18/1984    Years since quitting: 35.7  . Smokeless tobacco:  Former Clinical biochemist  . Vaping Use: Never used  Substance and Sexual Activity  . Alcohol use: Yes    Alcohol/week: 12.0 standard drinks    Types: 7 Glasses of wine, 5 Cans of beer per week  . Drug use: No  . Sexual activity: Not Currently  Other Topics Concern  . Not on file  Social History Narrative  . Not on file   Social Determinants of Health   Financial Resource Strain: Medium Risk  . Difficulty of Paying Living Expenses: Somewhat hard  Food Insecurity: No Food Insecurity  . Worried About Programme researcher, broadcasting/film/video in the Last Year: Never true  . Ran Out of Food in the Last Year: Never true  Transportation  Needs: No Transportation Needs  . Lack of Transportation (Medical): No  . Lack of Transportation (Non-Medical): No  Physical Activity:   . Days of Exercise per Week: Not on file  . Minutes of Exercise per Session: Not on file  Stress: No Stress Concern Present  . Feeling of Stress : Not at all  Social Connections: Moderately Integrated  . Frequency of Communication with Friends and Family: More than three times a week  . Frequency of Social Gatherings with Friends and Family: More than three times a week  . Attends Religious Services: More than 4 times per year  . Active Member of Clubs or Organizations: Yes  . Attends Banker Meetings: More than 4 times per year  . Marital Status: Widowed  Intimate Partner Violence: Unknown  . Fear of Current or Ex-Partner: Patient refused  . Emotionally Abused: Patient refused  . Physically Abused: Patient refused  . 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  No inhaled exposure from work He has done woodworking with wood dust exposure.  Was in the [employer] - no exposures.  [place] native.   Allergies  Allergen Reactions  . Sulfonamide Derivatives Rash     Outpatient Medications Prior to Visit  Medication Sig Dispense Refill  . apixaban (ELIQUIS) 5 MG TABS tablet Take 1 tablet (5 mg total) by mouth 2 (two) times daily. 180 tablet 1  . bimatoprost (LUMIGAN) 0.01 % SOLN Place 1 drop into both eyes at bedtime.    . simvastatin (ZOCOR) 20 MG tablet TAKE 1 TABLET BY MOUTH AT  BEDTIME 90 tablet 3  . triamcinolone ointment (KENALOG) 0.1 % Apply 1 application topically 2 (two) times daily as needed (rash).    . vitamin B-12 (CYANOCOBALAMIN) 1000 MCG tablet Take 1,000 mcg by mouth every evening.    . latanoprost (XALATAN) 0.005 % ophthalmic solution Place 1 drop into both eyes daily. (Patient not taking: Reported on 06/26/2020)    . levocetirizine (XYZAL) 5 MG tablet Take 1 tablet  (5 mg total) by mouth every evening. (Patient not taking: Reported on 06/26/2020) 30 tablet 2   No facility-administered medications prior to visit.       Objective:   Physical Exam Vitals:   06/26/20 1137  BP: 120/70  Pulse: 73  Temp: (!) 97.3 F (36.3 C)  TempSrc: Temporal  SpO2: 96%  Weight: 196 lb (88.9 kg)  Height: 6\' 3"  (1.905 m)    Gen: Pleasant, elderly gentleman, well-nourished, in no distress,  normal affect  ENT: No lesions,  mouth clear,  oropharynx clear, no postnasal drip  Neck: No JVD, no stridor  Lungs: No use of accessory muscles, no crackles or wheezing on normal respiration, no  wheeze on forced expiration  Cardiovascular: RRR, heart sounds normal, no murmur or gallops, no peripheral edema  Musculoskeletal: No deformities, no cyanosis or clubbing  Neuro: alert, awake, non focal  Skin: Warm, no lesions or rash       Assessment & Plan:  COPD (chronic obstructive pulmonary disease) (HCC) Moderately severe to severe obstruction based on his pulmonary function testing from today, although his clinical status certainly does not reflect this.  We talked about possibly starting scheduled bronchodilator therapy, possible Spiriva, but he is not sure that his breathing is negatively impacting him enough to be on an every day medication.  We will give him albuterol to use as needed, hold off on schedule BD for now.  If/when we do start scheduled medication will need to be mindful of the fact that he has a history of BPH, watch for any side effects of urinary retention.  Cough Resolved.  He was off Xyzal when I met him in July..  Discussed surveillance for any worsening of allergic rhinitis or cough.  If so he will restart antihistamine.   Baltazar Apo, MD, PhD 06/26/2020, 11:58 AM [facility] [phone] or if no answer [phone]

## 2020-06-26 NOTE — Patient Instructions (Addendum)
Your pulmonary function testing is consistent with COPD. We will hold off on starting an every day scheduled inhaler right now.  We may decide to do so in the future depending on how your breathing is doing. We will give you a prescription for albuterol.  You can keep this available to use 2 puffs if you need it for increased shortness of breath, chest tightness, wheezing.  This is a rescue inhaler that you do not have to use every day, only when needed. If your cough and allergic drainage return, consider restarting Xyzal Follow with Dr. Lamonte Sakai in 12 months or sooner if you have any problems.

## 2020-07-14 DIAGNOSIS — H401131 Primary open-angle glaucoma, bilateral, mild stage: Secondary | ICD-10-CM | POA: Diagnosis not present

## 2020-07-21 ENCOUNTER — Telehealth: Payer: Self-pay | Admitting: Pharmacist

## 2020-07-21 NOTE — Progress Notes (Signed)
Chronic Care Management Pharmacy Assistant   Name: Fernando Walker  MRN: [id number] DOB: 19-Feb-1933  Reason for Encounter: Disease State PCP : Binnie Rail, MD  Allergies:   Allergies  Allergen Reactions  . Sulfonamide Derivatives Rash    Medications: Outpatient Encounter Medications as of 07/21/2020  Medication Sig  . albuterol (VENTOLIN HFA) 108 (90 Base) MCG/ACT inhaler Inhale 2 puffs into the lungs every 6 (six) hours as needed for wheezing or shortness of breath.  Marland Kitchen apixaban (ELIQUIS) 5 MG TABS tablet Take 1 tablet (5 mg total) by mouth 2 (two) times daily.  . bimatoprost (LUMIGAN) 0.01 % SOLN Place 1 drop into both eyes at bedtime.  Marland Kitchen latanoprost (XALATAN) 0.005 % ophthalmic solution Place 1 drop into both eyes daily. (Patient not taking: Reported on 06/26/2020)  . levocetirizine (XYZAL) 5 MG tablet Take 1 tablet (5 mg total) by mouth every evening. (Patient not taking: Reported on 06/26/2020)  . simvastatin (ZOCOR) 20 MG tablet TAKE 1 TABLET BY MOUTH AT  BEDTIME  . triamcinolone ointment (KENALOG) 0.1 % Apply 1 application topically 2 (two) times daily as needed (rash).  . vitamin B-12 (CYANOCOBALAMIN) 1000 MCG tablet Take 1,000 mcg by mouth every evening.   No facility-administered encounter medications on file as of 07/21/2020.    Current Diagnosis: Patient Active Problem List   Diagnosis Date Noted  . COPD (chronic obstructive pulmonary disease) ([place]) 05/01/2020  . Aortic atherosclerosis ([place]) 03/27/2020  . Cough 03/26/2020  . Paroxysmal atrial fibrillation ([place]) 11/01/2019  . Tachycardia-bradycardia syndrome ([place]) 11/01/2019  . Dizziness 11/01/2019  . Aortic stenosis 07/31/2019  . Paroxysmal tachycardia, unspecified ([place]) 07/31/2019  . Coronary artery disease of native artery of native heart with stable angina pectoris ([place])   . GERD (gastroesophageal reflux disease)   . Dyslipidemia   . Carotid arterial disease ([place])   . CAD (coronary artery disease) of  artery bypass graft   . Dupuytren's contracture of left hand 07/20/2017  . 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 Hx of adenomatous colonic polyps 07/27/2011  . Carotid bruit 06/02/2011  . FASTING HYPERGLYCEMIA 11/10/2009  . CAD, ARTERY BYPASS GRAFT 04/29/2009  . MYOCARDIAL PERFUSION SCAN, WITH STRESS TEST, ABNORMAL 04/29/2009  . Hyperlipidemia 04/24/2009  . HYPERTROPHY PROSTATE W/UR OBST & OTH LUTS 04/22/2009    Goals Addressed   None     Follow-Up:  Pharmacist Review    . Current COPD regimen: Albuterol 108 (90 Base)  . No flowsheet data found. . Any recent hospitalizations or ED visits since last visit with CPP? No . Reports walking to the mail box causesIncreased shortness of breath  . What recent interventions/DTPs have been made by any provider to improve breathing since last visit: . Have you had exacerbation/flare-up since last visit? No What do you do when you are short of breath?  Adhere to COPD Action Plan Respiratory Devices/Equipment . Do you have a nebulizer? No . Do you use a Peak Flow Meter? No . Do you use a maintenance inhaler? Yes . How often do you forget to use your daily inhaler? The patient stated that he tries to remember to use his inhaler as much as he can he believes he is using it wrong. Went over with patient how to properly use inhaler. . Do you use a rescue inhaler? Yes . How often do you use your rescue inhaler?  daily . Do you use a spacer with your inhaler? No  Adherence Review: . Does the patient have >5 day gap between last estimated fill date for maintenance inhaler medications? No    Rosendo Gros, [facility]  Practice Team Manager/ CPA (Clinical Pharmacist Assistant) [phone]

## 2020-08-01 ENCOUNTER — Other Ambulatory Visit: Payer: Self-pay

## 2020-08-01 ENCOUNTER — Ambulatory Visit (INDEPENDENT_AMBULATORY_CARE_PROVIDER_SITE_OTHER): Payer: Medicare Other | Admitting: *Deleted

## 2020-08-01 DIAGNOSIS — Z23 Encounter for immunization: Secondary | ICD-10-CM

## 2020-08-30 ENCOUNTER — Ambulatory Visit: Payer: Medicare Other | Attending: Internal Medicine

## 2020-08-30 DIAGNOSIS — Z23 Encounter for immunization: Secondary | ICD-10-CM

## 2020-08-30 NOTE — Progress Notes (Signed)
   Covid-19 Vaccination Clinic  Name:  MAHKI SPIKES    MRN: [id number] DOB: May 01, 1933  08/30/2020  Mr. Hemann was observed post Covid-19 immunization for 15 minutes without incident. He was provided with Vaccine Information Sheet and instruction to access the V-Safe system.   Mr. Carithers was instructed to call 911 with any severe reactions post vaccine: Marland Kitchen Difficulty breathing  . Swelling of face and throat  . A fast heartbeat  . A bad rash all over body  . Dizziness and weakness

## 2020-10-04 ENCOUNTER — Other Ambulatory Visit: Payer: Self-pay | Admitting: Cardiovascular Disease

## 2020-10-06 NOTE — Telephone Encounter (Signed)
Pt last saw Dr Angelena Form 01/02/20, last labs 11/16/19 Creat 0.94, age 86, weight 88.9kg, based on specified criteria pt is on appropriate dosage of Eliquis 5mg  BID.  Will refill rx.

## 2020-10-20 DIAGNOSIS — H401131 Primary open-angle glaucoma, bilateral, mild stage: Secondary | ICD-10-CM | POA: Diagnosis not present

## 2020-10-28 ENCOUNTER — Telehealth: Payer: Self-pay | Admitting: Pharmacist

## 2020-10-28 NOTE — Progress Notes (Signed)
° ° °  Chronic Care Management Pharmacy Assistant   Name: Fernando Walker  MRN: [id number] DOB: Jun 25, 1933  Reason for Encounter: General Adherence Call   PCP : Fernando Rail, MD  Allergies:   Allergies  Allergen Reactions   Sulfonamide Derivatives Rash    Medications: Outpatient Encounter Medications as of 10/28/2020  Medication Sig   albuterol (VENTOLIN HFA) 108 (90 Base) MCG/ACT inhaler Inhale 2 puffs into the lungs every 6 (six) hours as needed for wheezing or shortness of breath.   bimatoprost (LUMIGAN) 0.01 % SOLN Place 1 drop into both eyes at bedtime.   ELIQUIS 5 MG TABS tablet TAKE 1 TABLET BY MOUTH  TWICE DAILY   latanoprost (XALATAN) 0.005 % ophthalmic solution Place 1 drop into both eyes daily. (Patient not taking: Reported on 06/26/2020)   levocetirizine (XYZAL) 5 MG tablet Take 1 tablet (5 mg total) by mouth every evening. (Patient not taking: Reported on 06/26/2020)   simvastatin (ZOCOR) 20 MG tablet TAKE 1 TABLET BY MOUTH AT  BEDTIME   triamcinolone ointment (KENALOG) 0.1 % Apply 1 application topically 2 (two) times daily as needed (rash).   vitamin B-12 (CYANOCOBALAMIN) 1000 MCG tablet Take 1,000 mcg by mouth every evening.   No facility-administered encounter medications on file as of 10/28/2020.    Current Diagnosis: Patient Active Problem List   Diagnosis Date Noted   COPD (chronic obstructive pulmonary disease) ([place]) 05/01/2020   Aortic atherosclerosis ([place]) 03/27/2020   Cough 03/26/2020   Paroxysmal atrial fibrillation ([place]) 11/01/2019   Tachycardia-bradycardia syndrome ([place]) 11/01/2019   Dizziness 11/01/2019   Aortic stenosis 07/31/2019   Paroxysmal tachycardia, unspecified ([place]) 07/31/2019   Coronary artery disease of native artery of native heart with stable angina pectoris (HCC)    GERD (gastroesophageal reflux disease)    Dyslipidemia    Carotid arterial disease ([place])    CAD (coronary artery disease) of artery bypass graft     Dupuytren's contracture of left hand 07/20/2017   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 The patient states that he believes that he has been doing ok. He did say that he does get dizzy and lightheaded at times, also he gets sob on occassions. He said that he has a rescue inhaler but really do not know when he is suppose to use it. He also stated that he is having some pain in his left shoulder that he thinks is coming from his hands, but has not seen anyone about it yet. Over all the patient did state that he needs to talk about when he needs to use his inhaler or if he needs to use it. I let the patient know that I would pass along the information to the clinical Pharmacist Fernando Walker.  Fernando Walker, [place] [phone]

## 2020-11-03 ENCOUNTER — Telehealth: Payer: Medicare Other

## 2020-11-03 NOTE — Chronic Care Management (AMB) (Deleted)
 Chronic Care Management Pharmacy  Name: Fernando Walker  MRN: [id number] DOB: 04/23/1933   Chief Complaint/ HPI  Fernando Walker,  87 y.o. , male presents for their Follow-Up CCM visit with the clinical pharmacist via telephone due to COVID-19 Pandemic.  PCP : Burns, Stacy J, MD  Their chronic conditions include: HTN, CAD (CABG 1995), Afib, HLD, BPH, GERD, COPD  Pt reports his wife of 62 years passed away in March. He is grieving but his family and friends have been visiting him and getting him through.    Office Visits: 03/26/20 Dr Burns OV: c/o cough. Unlikely infection, started Xyzal. CXR showed COPD, referred to pulm. Discontinued sertraline.  03/10/20 AWV 11/16/19 Dr Burns: acute visit for unexplained weight loss. Wife recently moved to SNF. Checked labs, all normal/unchanged from previous. Pt to continue checking weights at home, appears stabilized.  08/06/19 Dr Burns: pt experiencing caregiver stress, started sertraline 25 mg daily.    Consult Visit: 06/26/20 Dr Byrum (pulmonary): PFT shows moderately severe obstruction, clinical status does not reflect this. Pt declined maintenance inhaler. Rx'd albuterol to use PRN.  05/01/20 Dr Byrum (pulmonary): initial eval for cough, SOB. Cough resolved on its own. Suspect COPD,  Ordered PFT.  04/08/20 Dr Shapiro (eye): will switch to latanoprost when Lumigan runs out.  01/02/20 Dr McAlhany (cardiology): pt stable, no med changes.  11/01/19 Dr Taylor (cardiology EP): pt reports dizziness, etiology unclear, doubt related to Afib. Rec'd watchful waiting. No med changes.   10/03/19 NP (cardiology): No med changes, referred to EP for bradycardia.   08/24/19 Dr McAlhany (cardiology): Pt returned Holter monitor. HR 39 at night, Afib rates to 180 bpm. Start Eliquis. Use metoprolol prn for palpitations (could not tolerate daily due to dizziness). Referred to EP  Medications: Outpatient Encounter Medications as of 11/03/2020  Medication Sig   . albuterol (VENTOLIN HFA) 108 (90 Base) MCG/ACT inhaler Inhale 2 puffs into the lungs every 6 (six) hours as needed for wheezing or shortness of breath.  . bimatoprost (LUMIGAN) 0.01 % SOLN Place 1 drop into both eyes at bedtime.  . ELIQUIS 5 MG TABS tablet TAKE 1 TABLET BY MOUTH  TWICE DAILY  . latanoprost (XALATAN) 0.005 % ophthalmic solution Place 1 drop into both eyes daily. (Patient not taking: Reported on 06/26/2020)  . levocetirizine (XYZAL) 5 MG tablet Take 1 tablet (5 mg total) by mouth every evening. (Patient not taking: Reported on 06/26/2020)  . simvastatin (ZOCOR) 20 MG tablet TAKE 1 TABLET BY MOUTH AT  BEDTIME  . triamcinolone ointment (KENALOG) 0.1 % Apply 1 application topically 2 (two) times daily as needed (rash).  . vitamin B-12 (CYANOCOBALAMIN) 1000 MCG tablet Take 1,000 mcg by mouth every evening.   No facility-administered encounter medications on file as of 11/03/2020.     Current Diagnosis/Assessment:    Goals Addressed   None     AFIB / Hypertension   Patient is currently rate controlled. Checks HR at home with pulse ox Pulse Readings from Last 3 Encounters:  06/26/20 73  05/01/20 82  03/26/20 80   Office blood pressures are  BP Readings from Last 3 Encounters:  06/26/20 120/70  05/01/20 132/64  03/26/20 128/78   Patient checks BP at home infrequently Patient home BP readings are ranging: n/a  Patient has failed these meds in past: trandolapril Patient is currently controlled on the following medications:   Eliquis 5 mg BID  We discussed: Pt c/o occasional dizziness, much improved since ensuring adequate hydration   and standing slowly to prevent orthostasis. He goes through 16 oz water bottle 3-4 times daily.   Plan  Continue current medications  Ensure adequate fluid intake  Hyperlipidemia / CAD   LDL goal < 70 CAD - hx CABG 1995  Lipid Panel     Component Value Date/Time   CHOL 142 01/22/2019 0801   TRIG 56 01/22/2019 0801   HDL 64  01/22/2019 0801   CHOLHDL 2.2 01/22/2019 0801   CHOLHDL 2 08/27/2016 1007   VLDL 11.6 08/27/2016 1007   LDLCALC 67 01/22/2019 0801   LABVLDL 11 01/22/2019 0801   Hepatic Function Latest Ref Rng & Units 11/16/2019 01/22/2019 01/18/2018  Total Protein 6.0 - 8.3 g/dL 7.3 6.5 6.6  Albumin 3.5 - 5.2 g/dL 4.1 4.3 4.1  AST 0 - 37 U/L 18 21 18  ALT 0 - 53 U/L 20 19 18  Alk Phosphatase 39 - 117 U/L 52 41 45  Total Bilirubin 0.2 - 1.2 mg/dL 1.1 1.2 0.7  Bilirubin, Direct 0.00 - 0.40 mg/dL - - 0.24   Patient has failed these meds in past: n/a Patient is currently controlled on the following medications:   simvastatin 20 mg HS,   nitroglycerin 0.4 mg SL prn  We discussed:  Cholesterol goals; benefits of simvastatin for ASCVD risk reduction  Plan  Continue current medications   Depression/anxiety   Depression screen PHQ 2/9 08/06/2019 03/06/2019 02/24/2018  Decreased Interest 3 0 0  Down, Depressed, Hopeless 2 2 0  PHQ - 2 Score 5 2 0  Altered sleeping 1 0 0  Tired, decreased energy 1 1 0  Change in appetite 0 0 0  Feeling bad or failure about yourself  0 0 0  Trouble concentrating 0 0 0  Moving slowly or fidgety/restless 1 0 0  Suicidal thoughts 1 0 0  PHQ-9 Score 9 3 0  Difficult doing work/chores - Not difficult at all Not difficult at all   Patient has failed these meds in past: hydroxyzine, sertraline Patient is currently controlled on the following medications:   No medications  We discussed: Pt has stayed off of sertraline for last few months and denies issues.   Plan  Continue to monitor for depressive symptoms   COPD   Last spirometry score: 06/26/2020 -FEV1 62% predicted -FEV1/FVC ratio: 0.65  Gold Grade: Gold 2 (FEV1 50-79%) Current COPD Classification:  A (low sx, <2 exacerbations/yr) No flowsheet data found. Lab Results  Component Value Date/Time   EOSPCT 2.6 11/16/2019 11:46 AM   EOSABS 0.2 11/16/2019 11:46 AM   Patient has failed these meds in past:  *** Patient is currently {CHL Controlled/Uncontrolled:2109141014} on the following medications:  . Albuterol HFA prn  Using maintenance inhaler regularly? {yes/no:20286} Frequency of rescue inhaler use:  {CHL HP Upstream Pharm Inhaler Freq:2109141010}  We discussed:  {CHL HP Upstream Pharmacy discussion:2109141007}  Plan  Continue {CHL HP Upstream Pharmacy Plans:2109141003}  Glaucoma   Managed per Dr Shapiro  Patient has failed these meds in past: Lumigan (cost) Patient is currently controlled on the following medications:  . Latanoprost 0.005% eye drops - HS  We discussed: last visit coordinated with eye doctor to switch Lumigan to latanoprost for cost savings; pt still had 3 month supply of Lumigan but is now filling latanoprost for <$8/3 months.  Plan  Continue current medications  Vaccines   Reviewed and discussed patient's vaccination history.    Immunization History  Administered Date(s) Administered  . Fluad Quad(high Dose 65+) 07/10/2019, 08/01/2020  .   Influenza Split 07/27/2011, 08/03/2012  . Influenza Whole 09/07/2007, 07/24/2008, 07/23/2009, 08/07/2010  . Influenza, High Dose Seasonal PF 08/01/2013, 07/20/2017, 07/04/2018  . Influenza,inj,Quad PF,6+ Mos 06/26/2014, 08/04/2015  . PFIZER(Purple Top)SARS-COV-2 Vaccination 11/09/2019, 12/09/2019, 08/30/2020  . Pneumococcal Conjugate-13 11/03/2015  . Pneumococcal Polysaccharide-23 09/13/2012  . Tdap 04/27/2019    Plan  Recommend patient receive Shingrix vaccine  Medication Management   Pt uses [facility] for Eliquis, all others from [facility] mail Uses 2 pill boxes - AM and PM boxes Pt endorses 100% compliance  We discussed: Pt uses mail order for all meds at no cost, except he found Eliquis is cheaper at [facility].   Plan  Continue current medications    Follow up: *** month phone visit  Lindsey Foltanski, PharmD, BCACP Clinical Pharmacist [facility] [phone] 

## 2020-12-11 ENCOUNTER — Telehealth: Payer: Self-pay | Admitting: Emergency Medicine

## 2020-12-11 NOTE — Telephone Encounter (Signed)
Called and spoke with pt and he stated that at his last OV with RB he declined to take the everyday inhaler.  He stated that he now would like to start on this inhaler.  He would like this sent in to the mail order pharmacy.  RB please advise. Thanks

## 2020-12-15 NOTE — Telephone Encounter (Signed)
Please have him start Spiriva Respimat 2.71mcg, 2 puffs qd

## 2020-12-16 MED ORDER — SPIRIVA RESPIMAT 2.5 MCG/ACT IN AERS: 2.0000 | INHALATION_SPRAY | Freq: Every day | RESPIRATORY_TRACT | 2 refills | Status: DC

## 2020-12-16 NOTE — Telephone Encounter (Signed)
Lmtcb for pt.  

## 2020-12-16 NOTE — Telephone Encounter (Signed)
Patient is returning phone call. Patient phone number is [phone].

## 2020-12-16 NOTE — Telephone Encounter (Signed)
Called and spoke with order placed for Spiriva,  Patient asked for 90 day supply.  He verified pharmacy  Nothing further needed at this time.

## 2020-12-17 ENCOUNTER — Telehealth: Payer: Medicare Other

## 2020-12-18 ENCOUNTER — Observation Stay (HOSPITAL_COMMUNITY)
Admission: EM | Admit: 2020-12-18 | Discharge: 2020-12-19 | Disposition: A | Discharge status: Home / Self Care | Payer: Medicare Other | Attending: Internal Medicine | Admitting: Internal Medicine

## 2020-12-18 ENCOUNTER — Observation Stay (HOSPITAL_COMMUNITY): Payer: Medicare Other

## 2020-12-18 ENCOUNTER — Emergency Department (HOSPITAL_COMMUNITY): Payer: Medicare Other

## 2020-12-18 DIAGNOSIS — R41841 Cognitive communication deficit: Secondary | ICD-10-CM | POA: Diagnosis not present

## 2020-12-18 DIAGNOSIS — Z7901 Long term (current) use of anticoagulants: Secondary | ICD-10-CM | POA: Diagnosis not present

## 2020-12-18 DIAGNOSIS — I2510 Atherosclerotic heart disease of native coronary artery without angina pectoris: Secondary | ICD-10-CM | POA: Diagnosis not present

## 2020-12-18 DIAGNOSIS — I639 Cerebral infarction, unspecified: Secondary | ICD-10-CM | POA: Diagnosis not present

## 2020-12-18 DIAGNOSIS — R112 Nausea with vomiting, unspecified: Secondary | ICD-10-CM

## 2020-12-18 DIAGNOSIS — I959 Hypotension, unspecified: Secondary | ICD-10-CM | POA: Diagnosis not present

## 2020-12-18 DIAGNOSIS — Z87891 Personal history of nicotine dependence: Secondary | ICD-10-CM | POA: Diagnosis not present

## 2020-12-18 DIAGNOSIS — I771 Stricture of artery: Secondary | ICD-10-CM | POA: Diagnosis not present

## 2020-12-18 DIAGNOSIS — Z86010 Personal history of colonic polyps: Secondary | ICD-10-CM | POA: Diagnosis not present

## 2020-12-18 DIAGNOSIS — R1110 Vomiting, unspecified: Secondary | ICD-10-CM | POA: Diagnosis not present

## 2020-12-18 DIAGNOSIS — R42 Dizziness and giddiness: Secondary | ICD-10-CM

## 2020-12-18 DIAGNOSIS — I480 Paroxysmal atrial fibrillation: Secondary | ICD-10-CM | POA: Insufficient documentation

## 2020-12-18 DIAGNOSIS — Z79899 Other long term (current) drug therapy: Secondary | ICD-10-CM | POA: Diagnosis not present

## 2020-12-18 DIAGNOSIS — R29818 Other symptoms and signs involving the nervous system: Secondary | ICD-10-CM | POA: Diagnosis not present

## 2020-12-18 DIAGNOSIS — Z20822 Contact with and (suspected) exposure to covid-19: Secondary | ICD-10-CM | POA: Diagnosis not present

## 2020-12-18 DIAGNOSIS — I672 Cerebral atherosclerosis: Secondary | ICD-10-CM | POA: Diagnosis not present

## 2020-12-18 DIAGNOSIS — Z951 Presence of aortocoronary bypass graft: Secondary | ICD-10-CM | POA: Diagnosis not present

## 2020-12-18 DIAGNOSIS — Q283 Other malformations of cerebral vessels: Secondary | ICD-10-CM | POA: Diagnosis not present

## 2020-12-18 DIAGNOSIS — I10 Essential (primary) hypertension: Secondary | ICD-10-CM | POA: Diagnosis not present

## 2020-12-18 DIAGNOSIS — I708 Atherosclerosis of other arteries: Secondary | ICD-10-CM | POA: Diagnosis not present

## 2020-12-18 DIAGNOSIS — I6503 Occlusion and stenosis of bilateral vertebral arteries: Secondary | ICD-10-CM | POA: Diagnosis not present

## 2020-12-18 DIAGNOSIS — R110 Nausea: Secondary | ICD-10-CM | POA: Diagnosis not present

## 2020-12-18 DIAGNOSIS — I6782 Cerebral ischemia: Secondary | ICD-10-CM | POA: Diagnosis not present

## 2020-12-18 DIAGNOSIS — J449 Chronic obstructive pulmonary disease, unspecified: Secondary | ICD-10-CM | POA: Insufficient documentation

## 2020-12-18 DIAGNOSIS — G319 Degenerative disease of nervous system, unspecified: Secondary | ICD-10-CM | POA: Diagnosis not present

## 2020-12-18 LAB — LIPID PANEL
Cholesterol: 148 mg/dL (ref 0–200)
HDL: 68 mg/dL (ref >40)
LDL Cholesterol: 68 mg/dL (ref 0–99)
Total CHOL/HDL Ratio: 2.2 RATIO
Triglycerides: 60 mg/dL (ref <150)
VLDL: 12 mg/dL (ref 0–40)

## 2020-12-18 LAB — COMPREHENSIVE METABOLIC PANEL
ALT: 20 U/L (ref 0–44)
AST: 23 U/L (ref 15–41)
Albumin: 3.7 g/dL (ref 3.5–5.0)
Alkaline Phosphatase: 37 U/L — ABNORMAL LOW (ref 38–126)
Anion gap: 11 (ref 5–15)
BUN: 13 mg/dL (ref 8–23)
CO2: 23 mmol/L (ref 22–32)
Calcium: 9.3 mg/dL (ref 8.9–10.3)
Chloride: 104 mmol/L (ref 98–111)
Creatinine, Ser: 1.21 mg/dL (ref 0.61–1.24)
GFR, Estimated: 59 mL/min — ABNORMAL LOW (ref >60)
Glucose, Bld: 154 mg/dL — ABNORMAL HIGH (ref 70–99)
Potassium: 4.1 mmol/L (ref 3.5–5.1)
Sodium: 138 mmol/L (ref 135–145)
Total Bilirubin: 1.8 mg/dL — ABNORMAL HIGH (ref 0.3–1.2)
Total Protein: 6.8 g/dL (ref 6.5–8.1)

## 2020-12-18 LAB — HEMOGLOBIN A1C
Hgb A1c MFr Bld: 5.9 % — ABNORMAL HIGH (ref 4.8–5.6)
Mean Plasma Glucose: 122.63 mg/dL

## 2020-12-18 LAB — SARS CORONAVIRUS 2 (TAT 6-24 HRS): SARS Coronavirus 2: NEGATIVE

## 2020-12-18 LAB — TROPONIN I (HIGH SENSITIVITY)
Troponin I (High Sensitivity): 4 ng/L (ref <18)
Troponin I (High Sensitivity): 6 ng/L (ref <18)

## 2020-12-18 MED ORDER — APIXABAN 5 MG PO TABS
5.0000 mg | ORAL_TABLET | Freq: Two times a day (BID) | ORAL | Status: DC
  Administered 2020-12-18 – 2020-12-19 (×2): 5 mg via ORAL
  Filled 2020-12-18 (×2): qty 1

## 2020-12-18 MED ORDER — LATANOPROST 0.005 % OP SOLN
1.0000 [drp] | Freq: Every day | OPHTHALMIC | Status: DC
  Administered 2020-12-18: 1 [drp] via OPHTHALMIC
  Filled 2020-12-18: qty 2.5

## 2020-12-18 MED ORDER — ACETAMINOPHEN 650 MG RE SUPP: 650.0000 mg | RECTAL | Status: DC | PRN

## 2020-12-18 MED ORDER — SENNOSIDES-DOCUSATE SODIUM 8.6-50 MG PO TABS: 1.0000 | ORAL_TABLET | Freq: Every evening | ORAL | Status: DC | PRN

## 2020-12-18 MED ORDER — ACETAMINOPHEN 325 MG PO TABS
650.0000 mg | ORAL_TABLET | ORAL | Status: DC | PRN
  Filled 2020-12-18: qty 2

## 2020-12-18 MED ORDER — ALBUTEROL SULFATE HFA 108 (90 BASE) MCG/ACT IN AERS: 2.0000 | INHALATION_SPRAY | Freq: Four times a day (QID) | RESPIRATORY_TRACT | Status: DC | PRN

## 2020-12-18 MED ORDER — ACETAMINOPHEN 160 MG/5ML PO SOLN: 650.0000 mg | ORAL | Status: DC | PRN

## 2020-12-18 MED ORDER — TIOTROPIUM BROMIDE MONOHYDRATE 2.5 MCG/ACT IN AERS: 2.0000 | INHALATION_SPRAY | Freq: Every day | RESPIRATORY_TRACT | Status: DC

## 2020-12-18 MED ORDER — STROKE: EARLY STAGES OF RECOVERY BOOK
Freq: Once | Status: AC
  Filled 2020-12-18: qty 1

## 2020-12-18 MED ORDER — ONDANSETRON HCL 4 MG/2ML IJ SOLN: 4.0000 mg | Freq: Four times a day (QID) | INTRAMUSCULAR | Status: DC | PRN

## 2020-12-18 MED ORDER — UMECLIDINIUM BROMIDE 62.5 MCG/INH IN AEPB
1.0000 | INHALATION_SPRAY | Freq: Every day | RESPIRATORY_TRACT | Status: DC
  Filled 2020-12-18: qty 7

## 2020-12-18 MED ORDER — IOHEXOL 350 MG/ML SOLN
100.0000 mL | Freq: Once | INTRAVENOUS | Status: AC | PRN
  Administered 2020-12-18: 100 mL via INTRAVENOUS

## 2020-12-18 NOTE — ED Triage Notes (Signed)
[place] [facility] [place], dizziness. Pt was on the golf course for a few hours when he started staggering, got dizzy and was nauseated. Friends caught pt. Pt vomited a few time with EMS. EMS gave zofran 8mg  IV. Pt is currently vomiting. Per EMS pt had NSR w/PACs on monitor.

## 2020-12-18 NOTE — ED Notes (Signed)
Pt to ct 

## 2020-12-18 NOTE — ED Provider Notes (Signed)
[facility] EMERGENCY DEPARTMENT Provider Note   CSN: [id number] Arrival date & time: 12/18/20  1325     History Chief Complaint  Patient presents with  . Nausea    Fernando Walker is a 87 y.o. male.  HPI  87 year old male presents today via EMS reports of onset of staggering followed by nausea and vomiting.  He reports that he was playing golf today.  He was in his normal state of health.  He went to tee off on the fifth round and staggered backwards.  He then rode in the golf cart while his friends continued.  He became nauseated and vomiting with some vertigo.  He has not noted any other lateralized symptoms.  He has not had any weakness.  He continued to have nausea and vomiting in route via EMS.  He denies any headache, head injury, vision changes, chest pain, dyspnea, nausea, vomiting, diarrhea, abdominal pain, URI symptoms, fever, or chills.  He reports having his Covid vaccines up-to-date. He has not had any similar episodes in the past He is currently having intermittent episodes of dizziness and vomiting. He was a smoker ("a Cristino Martes smoker- [place] 4-5 packs a day") until 1985 when he abruptly quit.      Past Medical History:  Diagnosis Date  . CAD (coronary artery disease) of artery bypass graft   . Carotid arterial disease (HCC)    Nonobstructive  . Cataract   . Dyslipidemia   . GERD (gastroesophageal reflux disease)   . 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 Hyperlipidemia   . Hypertension   . Hypertrophy of prostate with urinary obstruction and other lower urinary tract symptoms (LUTS)   . Pneumonia    As a child  . Pneumonia    as child    Patient Active Problem List   Diagnosis Date Noted  . COPD (chronic obstructive pulmonary disease) ([place]) 05/01/2020  . Aortic atherosclerosis ([place]) 03/27/2020  . Cough 03/26/2020  . Paroxysmal atrial fibrillation ([place]) 11/01/2019  . Tachycardia-bradycardia syndrome ([place]) 11/01/2019  . Dizziness  11/01/2019  . Aortic stenosis 07/31/2019  . Paroxysmal tachycardia, unspecified ([place]) 07/31/2019  . Coronary artery disease of native artery of native heart with stable angina pectoris ([place])   . GERD (gastroesophageal reflux disease)   . Dyslipidemia   . Carotid arterial disease ([place])   . CAD (coronary artery disease) of artery bypass graft   . Dupuytren's contracture of left hand 07/20/2017  . 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 Hx of adenomatous colonic polyps 07/27/2011  . Carotid bruit 06/02/2011  . FASTING HYPERGLYCEMIA 11/10/2009  . CAD, ARTERY BYPASS GRAFT 04/29/2009  . MYOCARDIAL PERFUSION SCAN, WITH STRESS TEST, ABNORMAL 04/29/2009  . Hyperlipidemia 04/24/2009  . HYPERTROPHY PROSTATE W/UR OBST & OTH LUTS 04/22/2009    Past Surgical History:  Procedure Laterality Date  . cataracts     both eyes  . COLONOSCOPY    . colonoscopy with polypectomy  2004  . CORONARY ARTERY BYPASS GRAFT  1995   X 7  . FASCIECTOMY Left 11/29/2017   Procedure: SEGMENTAL FASCIECTOMY LEFT SMALL FINGER;  Surgeon: Daryll Brod, MD;  Location: [facility];  Service: Orthopedics;  Laterality: Left;  block and MAC  . LEFT HEART CATH AND CORS/GRAFTS ANGIOGRAPHY N/A 07/25/2019   Procedure: LEFT HEART CATH AND CORS/GRAFTS ANGIOGRAPHY;  Surgeon: Burnell Blanks, MD;  Location: [facility];  Service: Cardiovascular;  Laterality: N/A;  . POLYPECTOMY  Family History  Problem Relation Age of Onset  . Stroke Father 51  . Diabetes Neg Hx   . Cancer Neg Hx   . GER disease Neg Hx   . Heart attack Neg Hx   . Colon cancer Neg Hx   . Esophageal cancer Neg Hx   . Stomach cancer Neg Hx   . Heart disease Neg Hx     Social History   Tobacco Use  . Smoking status: Former Smoker    Packs/day: 4.00    Years: 30.00    Pack years: 120.00    Quit date: 10/18/1984    Years since quitting: 36.1  . Smokeless tobacco: Former  Network engineer  . Vaping Use: Never used  Substance Use Topics  . Alcohol use: Yes    Alcohol/week: 12.0 standard drinks    Types: 7 Glasses of wine, 5 Cans of beer per week  . Drug use: No    Home Medications Prior to Admission medications   Medication Sig Start Date End Date Taking? Authorizing Provider  albuterol (VENTOLIN HFA) 108 (90 Base) MCG/ACT inhaler Inhale 2 puffs into the lungs every 6 (six) hours as needed for wheezing or shortness of breath. 06/26/20   Collene Gobble, MD  bimatoprost (LUMIGAN) 0.01 % SOLN Place 1 drop into both eyes at bedtime.    [provider]  ELIQUIS 5 MG TABS tablet TAKE 1 TABLET BY MOUTH  TWICE DAILY 10/06/20   Burnell Blanks, MD  latanoprost (XALATAN) 0.005 % ophthalmic solution Place 1 drop into both eyes daily. Patient not taking: Reported on 06/26/2020 03/18/20   [provider]  levocetirizine (XYZAL) 5 MG tablet Take 1 tablet (5 mg total) by mouth every evening. Patient not taking: Reported on 06/26/2020 03/26/20   Binnie Rail, MD  simvastatin (ZOCOR) 20 MG tablet TAKE 1 TABLET BY MOUTH AT  BEDTIME 12/31/19   Burnell Blanks, MD  Tiotropium Bromide Monohydrate (SPIRIVA RESPIMAT) 2.5 MCG/ACT AERS Inhale 2 puffs into the lungs daily. 12/16/20   Collene Gobble, MD  triamcinolone ointment (KENALOG) 0.1 % Apply 1 application topically 2 (two) times daily as needed (rash).    [provider]  vitamin B-12 (CYANOCOBALAMIN) 1000 MCG tablet Take 1,000 mcg by mouth every evening.    [provider]    Allergies    Sulfonamide derivatives  Review of Systems   Review of Systems  All other systems reviewed and are negative.   Physical Exam Updated Vital Signs BP 140/79   Pulse (!) 59   Temp 98 F (36.7 C) (Oral)   Resp 16   SpO2 93%   Physical Exam Vitals and nursing note reviewed.  Constitutional:      General: He is not in acute distress.    Appearance: Normal appearance. He is not  ill-appearing.  HENT:     Head: Normocephalic.     Right Ear: Ear canal and external ear normal.     Left Ear: Ear canal and external ear normal.     Nose: Nose normal.     Mouth/Throat:     Mouth: Mucous membranes are moist.     Pharynx: Oropharynx is clear.  Eyes:     Extraocular Movements: Extraocular movements intact.     Pupils: Pupils are equal, round, and reactive to light.  Cardiovascular:     Rate and Rhythm: Normal rate and regular rhythm.     Pulses: Normal pulses.     Heart  sounds: Murmur heard.      Comments: Systolic [id number] Pulmonary:     Effort: Pulmonary effort is normal.     Comments: Decreased bs bilateral bases Abdominal:     General: Abdomen is flat. Bowel sounds are normal. There is no distension.     Palpations: Abdomen is soft. There is no mass.     Tenderness: There is no abdominal tenderness.  Musculoskeletal:        General: No swelling, tenderness or deformity. Normal range of motion.     Cervical back: Normal range of motion.     Right lower leg: No edema.  Skin:    General: Skin is warm and dry.     Capillary Refill: Capillary refill takes less than 2 seconds.  Neurological:     General: No focal deficit present.     Mental Status: He is alert and oriented to person, place, and time.     Cranial Nerves: No cranial nerve deficit.     Sensory: No sensory deficit.     Motor: No weakness.     Coordination: Coordination normal.     Deep Tendon Reflexes: Reflexes normal.  Psychiatric:        Mood and Affect: Mood normal.        Behavior: Behavior normal.     ED Results / Procedures / Treatments   Labs (all labs ordered are listed, but only abnormal results are displayed) Labs Reviewed  COMPREHENSIVE METABOLIC PANEL - Abnormal; Notable for the following components:      Result Value   Glucose, Bld 154 (*)    Alkaline Phosphatase 37 (*)    Total Bilirubin 1.8 (*)    GFR, Estimated 59 (*)    All other components within normal limits   SARS CORONAVIRUS 2 (TAT 6-24 HRS)  TROPONIN I (HIGH SENSITIVITY)  TROPONIN I (HIGH SENSITIVITY)    EKG EKG Interpretation  Date/Time:  Thursday December 18 2020 13:36:41 EST Ventricular Rate:  75 PR Interval:    QRS Duration: 93 QT Interval:  384 QTC Calculation: 429 R Axis:   55 Text Interpretation: Sinus rhythm Probable left atrial enlargement Borderline T wave abnormalities Confirmed by Pattricia Boss [phone]) on 12/18/2020 3:49:37 PM   Radiology DG Chest 1 View  Result Date: 12/18/2020 CLINICAL DATA:  Dizziness and vomiting EXAM: CHEST  1 VIEW COMPARISON:  Chest radiograph March 26, 2020 FINDINGS: Prior median sternotomy and CABG with surgical clips along the left mediastinum. Aortic atherosclerosis. The heart size and mediastinal contours are within normal limits. Left pleural thickening. Biapical pleuroparenchymal scarring. No focal consolidation. No pleural effusion. The visualized skeletal structures are unremarkable. IMPRESSION: 1. No acute cardiopulmonary disease. 2. Aortic atherosclerosis. Electronically Signed   By: Dahlia Bailiff MD   On: 12/18/2020 14:31   CT Head Wo Contrast  Result Date: 12/18/2020 CLINICAL DATA:  Dizziness EXAM: CT HEAD WITHOUT CONTRAST TECHNIQUE: Contiguous axial images were obtained from the base of the skull through the vertex without intravenous contrast. COMPARISON:  None. FINDINGS: Brain: No evidence of acute large vascular territory infarction, hemorrhage, hydrocephalus, extra-axial collection or mass lesion/mass effect. Vascular: No hyperdense vessel. Atherosclerotic calcifications internal carotid arteries. Skull: Normal. Negative for fracture or focal lesion. Sinuses/Orbits: Paranasal sinuses are predominantly clear. Mastoid air cells are clear. Other: None IMPRESSION: No acute intracranial findings. Electronically Signed   By: Dahlia Bailiff MD   On: 12/18/2020 14:34    Procedures Procedures   Medications Ordered in ED Medications - No data to  display  ED Course  I have reviewed the triage vital signs and the nursing notes.  Pertinent labs & imaging results that were available during my care of the patient were reviewed by me and considered in my medical decision making (see chart for details).    MDM Rules/Calculators/A&P                          Patient with sudden onset of staggering, nausea and vomiting.  Normal neurologic exam.  CT obtained with chronic anticoagulation.  CT head without acute abnormalities.  Plan MRI. Patient care handed off to Dr. Vallery Ridge. Final Clinical Impression(s) / ED Diagnoses Final diagnoses:  Dizziness  Non-intractable vomiting with nausea, unspecified vomiting type    Rx / DC Orders ED Discharge Orders    None       Pattricia Boss, MD 12/18/20 1551

## 2020-12-18 NOTE — Consult Note (Addendum)
Neurology Consultation  Reason for Consult: MRI with small vessel acute infarct Referring Physician: Dr. Johnney Killian  CC: Stumbling gait with vomiting today while golfing  History is obtained from: Patient, chart review  HPI: Fernando Walker is a 87 y.o. male with a medical history significant for atrial fibrillation on Eliquis, hypertension, hyperlipidemia, coronary artery disease s/p seven vessel CABG in 1995, and GERD who presents today for evaluation following unsteady gait, loss of balance, and an episode of vomiting while golfing this morning. He states he was on the 5th hole when he bent down to place his ball on the tee and when he stood back up, he could not overcome his backward momentum and stumbled backward until his friends caught him and lowered him to the ground. He decided to sit out for a few rounds but then began to dry heave until he vomited. EMS was activated and he was brought to the ED. MRI was obtained revealing an acute 5 mm right caudate head infarct in addition to chronic bilateral cerebellar and right pontine lacunar insults.   On examination patient states he is back to baseline and feels great. He states he is compliant with his Eliquis but may miss approximately one dose per month. He does state that he has had episodes of dizziness and intense nausea similar to today in the past but they have all spontaneously resolved.   LKW: 12:00  tpa given?: no, outside of time window  ROS: A 14 point ROS was performed and is negative except as noted in the HPI.   Past Medical History:  Diagnosis Date  . CAD (coronary artery disease) of artery bypass graft   . Carotid arterial disease (HCC)    Nonobstructive  . Cataract   . Dyslipidemia   . GERD (gastroesophageal reflux disease)   . 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 Hyperlipidemia   . Hypertension   . Hypertrophy of prostate with urinary obstruction and other lower urinary tract symptoms (LUTS)   . Pneumonia    As a  child  . Pneumonia    as child   Past Surgical History:  Procedure Laterality Date  . cataracts     both eyes  . COLONOSCOPY    . colonoscopy with polypectomy  2004  . CORONARY ARTERY BYPASS GRAFT  1995   X 7  . FASCIECTOMY Left 11/29/2017   Procedure: SEGMENTAL FASCIECTOMY LEFT SMALL FINGER;  Surgeon: Daryll Brod, MD;  Location: [facility];  Service: Orthopedics;  Laterality: Left;  block and MAC  . LEFT HEART CATH AND CORS/GRAFTS ANGIOGRAPHY N/A 07/25/2019   Procedure: LEFT HEART CATH AND CORS/GRAFTS ANGIOGRAPHY;  Surgeon: Burnell Blanks, MD;  Location: [facility];  Service: Cardiovascular;  Laterality: N/A;  . POLYPECTOMY     Family History  Problem Relation Age of Onset  . Stroke Father 87  . Diabetes Neg Hx   . Cancer Neg Hx   . GER disease Neg Hx   . Heart attack Neg Hx   . Colon cancer Neg Hx   . Esophageal cancer Neg Hx   . Stomach cancer Neg Hx   . Heart disease Neg Hx    Social History:   reports that he quit smoking about 36 years ago. He has a 120.00 pack-year smoking history. He has quit using smokeless tobacco. He reports current alcohol use of about 12.0 standard drinks of alcohol per week. He reports that he does not use drugs.  Medications Current Outpatient Medications  Medication Instructions  . albuterol (VENTOLIN HFA) 108 (90 Base) MCG/ACT inhaler 2 puffs, Inhalation, Every 6 hours PRN  . bimatoprost (LUMIGAN) 0.01 % SOLN 1 drop, Both Eyes, Daily at bedtime  . ELIQUIS 5 MG TABS tablet TAKE 1 TABLET BY MOUTH  TWICE DAILY  . latanoprost (XALATAN) 0.005 % ophthalmic solution Place 1 drop into both eyes daily.  Marland Kitchen levocetirizine (XYZAL) 5 mg, Oral, Every evening  . simvastatin (ZOCOR) 20 MG tablet TAKE 1 TABLET BY MOUTH AT  BEDTIME  . Tiotropium Bromide Monohydrate (SPIRIVA RESPIMAT) 2.5 MCG/ACT AERS 2 puffs, Inhalation, Daily  . triamcinolone ointment (KENALOG) 0.1 % 1 application, Topical, 2 times daily PRN  . vitamin B-12  (CYANOCOBALAMIN) 1,000 mcg, Oral, Every evening   Exam: Current vital signs: BP (!) 130/111   Pulse 91   Temp 98 F (36.7 C) (Oral)   Resp (!) 23   SpO2 91%  Vital signs in last 24 hours: Temp:  [97.7 F (36.5 C)-98 F (36.7 C)] 98 F (36.7 C) (03/03 1400) Pulse Rate:  [59-91] 91 (03/03 1730) Resp:  [12-23] 23 (03/03 1730) BP: (127-152)/(61-111) 130/111 (03/03 1730) SpO2:  [90 %-98 %] 91 % (03/03 1730)  GENERAL: Awake, alert, sitting up in bed with family at bedside Head: - Normocephalic and atraumatic EENT: wears eyeglasses and hearing aids, normal conjunctiva, no OP obstruction LUNGS: Normal respiratory effort. Non-labored breathing CV: irregular rate and rhythm. Extremities warm, without edema ABDOMEN - Soft, nontender Ext: warm, well perfused  NEURO:  Mental Status: alert and oriented to person, time, age, year, month, and situation Patient is able to give a clear and coherent history of present illness.  Speech is clear without aphasia or dysarthria. No signs of neglect noted.  Naming, comprehension, fluency, attention, and comprehension are intact. Cranial Nerves:  II: PERRL 2 mm/brisk. Visual fields full.  III, IV, VI: EOMI. Lid elevation symmetric and full.  V: Sensation is intact to light touch and symmetrical to face.   VII: Face is symmetric resting and smiling. Able to puff cheeks and raise eyebrows.  VIII: Hearing intact to voice, hard of hearing, hearing aids in place IX, X: Palate elevation is symmetric. Phonation normal.  XI: Normal sternocleidomastoid and trapezius muscle strength XII: Tongue protrudes midline without fasciculations.   Motor: 5/5 strength is all muscle groups. Spontaneous antigravity movement present in all extremities.  Tone is normal. Bulk is normal.  Sensation- Intact to light touch bilaterally in all four extremities. Extinction intact. 2 point discrimination.  Coordination: FTN intact bilaterally. HKS intact bilaterally. No pronator  drift.  Gait- deferred  1a Level of Conscious.: 0 1b LOC Questions: 0 1c LOC Commands: 0 2 Best Gaze: 0 3 Visual: 0 4 Facial Palsy: 0 5a Motor Arm - left: 0 5b Motor Arm - Right: 0 6a Motor Leg - Left: 0 6b Motor Leg - Right: 0 7 Limb Ataxia: 0 8 Sensory: 0 9 Best Language: 0 10 Dysarthria: 0 11 Extinct. and Inatten.: 0 TOTAL: 0  Labs I have reviewed labs in epic and the results pertinent to this consultation are: CBC    Component Value Date/Time   WBC 9.2 11/16/2019 1146   RBC 4.81 11/16/2019 1146   HGB 14.7 11/16/2019 1146   HGB 14.9 10/03/2019 1212   HCT 44.2 11/16/2019 1146   HCT 45.5 10/03/2019 1212   PLT 337.0 11/16/2019 1146   PLT 278 10/03/2019 1212   MCV 92.0 11/16/2019 1146   MCV 93 10/03/2019 1212   MCH  30.3 10/03/2019 1212   MCHC 33.3 11/16/2019 1146   RDW 13.9 11/16/2019 1146   RDW 12.4 10/03/2019 1212   LYMPHSABS 1.2 11/16/2019 1146   MONOABS 0.7 11/16/2019 1146   EOSABS 0.2 11/16/2019 1146   BASOSABS 0.1 11/16/2019 1146   BMP Latest Ref Rng & Units 12/18/2020 11/16/2019 10/03/2019  Glucose 70 - 99 mg/dL 154(H) 110(H) 100(H)  BUN 8 - 23 mg/dL _0 Creatinine 0.61 - 1.24 mg/dL 1.21 0.94 1.07  BUN/Creat Ratio 10 - 24 - - 12  Sodium 135 - 145 mmol/L 138 137 142  Potassium 3.5 - 5.1 mmol/L 4.1 4.2 4.8  Chloride 98 - 111 mmol/L 104 101 101  CO2 22 - 32 mmol/L 23 32 27  Calcium 8.9 - 10.3 mg/dL 9.3 9.5 9.8    I have reviewed the images obtained: CT-scan of the brain: No acute intracranial findings.  MRI examination of the brain: Acute 5 mm right caudate head infarct. Chronic bilateral cerebellar and right pontine lacunar insults. Mild cerebral atrophy and moderate chronic microvascular ischemic changes.  Assessment: 87 year old male with history as above who presents following unsteady gait, dizziness, and vomiting while golfing today with MRI evidence of acute 5 mm right caudate head infarct. On assessment, patient endorses resolution of  symptoms and states return to baseline.  - MRI with small vessel appearing acute stroke with chronic bilateral cerebellar and right pontine lacunar infarcts. CT angio head and neck pending for vessel imaging.  - Patient with multiple stroke risk factors: CAD, HTN, HLD, previous stroke, atrial fibrillation. - Examination without focal neurological deficits in the ED, patient endorses resolution of symptoms.  - Etiology likely atherosclerotic disease versus cardioembolic. Patient on Eliquis at home with good compliance.   Recommendations: - HgbA1c, fasting lipid panel - CTA head and neck vessel imaging - Frequent neuro checks - Echocardiogram - Prophylactic therapy- Antiplatelet med: continue home Eliquis - Risk factor modification - Telemetry monitoring - PT consult, OT consult, Speech consult - Stroke team to follow  Anibal Henderson, AGAC-NP [facility] Pager: [phone]   Attending Attestation: Patient seen, examined, labs,vitals and notes reviewed and agree with assessment and plan as documented above. I have independently reviewed the chart, obtained history, review of systems and examined the patient.  Reviewed MRI brain which showed acute stroke in the right head of the caudate and chronic lacunar strokes in bilateral cerebellar lobes and in right pons. Moderate chronic microvascular ischemic changes. Reviewed CTA head and neck which did not show Negative CTA for large vessel occlusion. Bulky calcified plaque about the carotid bifurcations/proximal ICAs with associated stenoses of up to 65-70% by NASCET criteria bilaterally. Severe near occlusive stenosis at the origin of the dominant left vertebral artery. 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 Aortic atherosclerosis.   87 yo man PMHx atrial fibrillation (apixaban - tPA contraindicated), HTN, HLD, CAD s/p 7 vessel CABG (1995) developed unsteradiness, dizziness and vomiting now back  to his baseline and found to have acute stroke in the head of right caudate without involvement of the overlying cortex. The patient indeed has the most common risk factors for stroke such a lesion compounded by severe intra and extracranial atherosclerosis and needs aggressive medical management. May also consider vascular evaluation to further stratify risk factors.  Electronically signed by:  Lynnae Sandhoff, MD Page: [phone] 12/19/2020, 1:37 AM

## 2020-12-18 NOTE — ED Provider Notes (Addendum)
Golfing, staggered backwards losing balance.  Normal neurologic examination.  Anticoagulated.  CT head.  Will need follow-up MRI. Physical Exam  BP 140/79   Pulse (!) 59   Temp 98 F (36.7 C) (Oral)   Resp 16   SpO2 93%   Physical Exam  ED Course/Procedures     Procedures  MDM  15:55 patient updated on CT results.  Reviewed history.  Patient updated on plan for MRI.  Patient is agreeable.  He is alert and appropriate.  No signs of distress at this time.  Reports symptoms have improved.  Consult: Reviewed with Dr. Leonel Ramsay.  He will do consult the emergency department.  We will plan for admission for stroke work-up  17: 40 patient is alert and in no distress.  He is chatting pleasantly with his daughter.  Asymptomatic at this time.  Will admit for stroke work-up.   Consult: Dr. Jamse Arn consulted for medical admission    Charlesetta Shanks, MD 12/18/20 1743    Charlesetta Shanks, MD 12/18/20 1806

## 2020-12-18 NOTE — ED Notes (Signed)
Attempted to call report

## 2020-12-18 NOTE — ED Notes (Signed)
Pt returned from MRI °

## 2020-12-18 NOTE — H&P (Signed)
History and Physical:    Fernando Walker   UKG:[id number] DOB: 24-Sep-1933 DOA: 12/18/2020  Referring MD/provider: Dr. Vallery Ridge PCP: Binnie Rail, MD   Patient coming from: Home  Chief Complaint: Sudden onset gait instability with associated dizziness, nausea and dry heaves.  History of Present Illness:   Fernando Walker is an 87 y.o. male with PMH significant for atrial fibrillation on Eliquis, HL and COPD who was playing golf today when he had sudden onset of staggering gait and sensation of falling backward.  He was immediately dizzy and started having nausea with dry heaves.  He finished off the golf game by sitting in the golf cart however when the dizziness nausea and dry heaves did not resolve he came to the ED via EMS.  Patient states he felt entirely well until he had that sudden onset of gait instability.  Has not had any recent fevers chills abdominal pain cough shortness of breath or urinary symptoms.  ED Course:  The patient was evaluated for stroke.  Head CT was negative for intracranial bleed however MRI was positive for acute 5 mm caudate infarct.  Patient was treated with Zofran with resolution of dizziness and nausea.  He is being seen by neurology and called in to [facility] for admission.  ROS:   ROS   Review of Systems: Per HPI  Past Medical History:   Past Medical History:  Diagnosis Date  . CAD (coronary artery disease) of artery bypass graft   . Carotid arterial disease (HCC)    Nonobstructive  . Cataract   . Dyslipidemia   . GERD (gastroesophageal reflux disease)   . 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 Hyperlipidemia   . Hypertension   . Hypertrophy of prostate with urinary obstruction and other lower urinary tract symptoms (LUTS)   . Pneumonia    As a child  . Pneumonia    as child    Past Surgical History:   Past Surgical History:  Procedure Laterality Date  . cataracts     both eyes  . COLONOSCOPY    . colonoscopy with  polypectomy  2004  . CORONARY ARTERY BYPASS GRAFT  1995   X 7  . FASCIECTOMY Left 11/29/2017   Procedure: SEGMENTAL FASCIECTOMY LEFT SMALL FINGER;  Surgeon: Daryll Brod, MD;  Location: [facility];  Service: Orthopedics;  Laterality: Left;  block and MAC  . LEFT HEART CATH AND CORS/GRAFTS ANGIOGRAPHY N/A 07/25/2019   Procedure: LEFT HEART CATH AND CORS/GRAFTS ANGIOGRAPHY;  Surgeon: Burnell Blanks, MD;  Location: [facility];  Service: Cardiovascular;  Laterality: N/A;  . POLYPECTOMY      Social History:   Social History   Socioeconomic History  . Marital status: Widowed    Spouse name: Not on file  . Number of children: 2  . Years of education: 16  . Highest education level: Bachelor's degree (e.g., BA, AB, BS)  Occupational History  . Occupation: Retired  Tobacco Use  . Smoking status: Former Smoker    Packs/day: 4.00    Years: 30.00    Pack years: 120.00    Quit date: 10/18/1984    Years since quitting: 36.1  . Smokeless tobacco: Former Network engineer  . Vaping Use: Never used  Substance and Sexual Activity  . Alcohol use: Yes    Alcohol/week: 12.0 standard drinks    Types: 7 Glasses of wine, 5 Cans of beer per week  . Drug use: No  .  Sexual activity: Not Currently  Other Topics Concern  . Not on file  Social History Narrative  . Not on file   Social Determinants of Health   Financial Resource Strain: Medium Risk  . Difficulty of Paying Living Expenses: Somewhat hard  Food Insecurity: No Food Insecurity  . Worried About Charity fundraiser in the Last Year: Never true  . Ran Out of Food in the Last Year: Never true  Transportation Needs: No Transportation Needs  . Lack of Transportation (Medical): No  . Lack of Transportation (Non-Medical): No  Physical Activity: Not on file  Stress: No Stress Concern Present  . Feeling of Stress : Not at all  Social Connections: Moderately Integrated  . Frequency of Communication with Friends and  Family: More than three times a week  . Frequency of Social Gatherings with Friends and Family: More than three times a week  . Attends Religious Services: More than 4 times per year  . Active Member of Clubs or Organizations: Yes  . Attends Archivist Meetings: More than 4 times per year  . Marital Status: Widowed  Intimate Partner Violence: Unknown  . Fear of Current or Ex-Partner: Patient refused  . Emotionally Abused: Patient refused  . Physically Abused: Patient refused  . Sexually Abused: Patient refused    Allergies   Sulfonamide derivatives  Family history:   Family History  Problem Relation Age of Onset  . Stroke Father 77  . Diabetes Neg Hx   . Cancer Neg Hx   . GER disease Neg Hx   . Heart attack Neg Hx   . Colon cancer Neg Hx   . Esophageal cancer Neg Hx   . Stomach cancer Neg Hx   . Heart disease Neg Hx     Current Medications:   Prior to Admission medications   Medication Sig Start Date End Date Taking? Authorizing Provider  albuterol (VENTOLIN HFA) 108 (90 Base) MCG/ACT inhaler Inhale 2 puffs into the lungs every 6 (six) hours as needed for wheezing or shortness of breath. 06/26/20  Yes Byrum, Rose Fillers, MD  ELIQUIS 5 MG TABS tablet TAKE 1 TABLET BY MOUTH  TWICE DAILY Patient taking differently: Take 5 mg by mouth 2 (two) times daily. 10/06/20  Yes Burnell Blanks, MD  latanoprost (XALATAN) 0.005 % ophthalmic solution Place 1 drop into both eyes at bedtime. 03/18/20  Yes [provider]  simvastatin (ZOCOR) 20 MG tablet TAKE 1 TABLET BY MOUTH AT  BEDTIME Patient taking differently: Take 20 mg by mouth daily. 12/31/19  Yes Burnell Blanks, MD  triamcinolone ointment (KENALOG) 0.1 % Apply 1 application topically 2 (two) times daily as needed (rash).   Yes [provider]  vitamin B-12 (CYANOCOBALAMIN) 1000 MCG tablet Take 1,000 mcg by mouth every evening.   Yes [provider]  levocetirizine (XYZAL) 5 MG tablet  Take 1 tablet (5 mg total) by mouth every evening. Patient not taking: No sig reported 03/26/20   Binnie Rail, MD  Tiotropium Bromide Monohydrate (SPIRIVA RESPIMAT) 2.5 MCG/ACT AERS Inhale 2 puffs into the lungs daily. 12/16/20   Collene Gobble, MD    Physical Exam:   Vitals:   12/18/20 1615 12/18/20 1730 12/18/20 1745 12/18/20 1800  BP: 127/61 (!) 130/111 (!) 150/81 (!) 171/92  Pulse: 67 91 74 76  Resp: 12 (!) 23 (!) 22 20  Temp:      TempSrc:      SpO2: 98% 91% 97% 96%  Physical Exam: Blood pressure (!) 171/92, pulse 76, temperature 98 F (36.7 C), temperature source Oral, resp. rate 20, SpO2 96 %. Gen: Well-appearing man looking younger than stated age in excellent spirits with his attentive daughter at bedside. Eyes: sclera anicteric, conjuctiva mildly injected bilaterally CVS: S1-S2, regulary, no gallops Respiratory:  decreased air entry likely secondary to decreased inspiratory effort GI: NABS, soft, NT  LE: No edema. No cyanosis Neuro: A/O x 3, further neuro exam per neurology consultation. Psych: patient is logical and coherent, judgement and insight appear normal, mood and affect appropriate to situation.    Data Review:    Labs: Basic Metabolic Panel: Recent Labs  Lab 12/18/20 1351  NA 138  K 4.1  CL 104  CO2 23  GLUCOSE 154*  BUN 13  CREATININE 1.21  CALCIUM 9.3   Liver Function Tests: Recent Labs  Lab 12/18/20 1351  AST 23  ALT 20  ALKPHOS 37*  BILITOT 1.8*  PROT 6.8  ALBUMIN 3.7   No results for input(s): LIPASE, AMYLASE in the last 168 hours. No results for input(s): AMMONIA in the last 168 hours. CBC: No results for input(s): WBC, NEUTROABS, HGB, HCT, MCV, PLT in the last 168 hours. Cardiac Enzymes: No results for input(s): CKTOTAL, CKMB, CKMBINDEX, TROPONINI in the last 168 hours.  BNP (last 3 results) No results for input(s): PROBNP in the last 8760 hours. CBG: No results for input(s): GLUCAP in the last 168  hours.  Urinalysis    Component Value Date/Time   COLORURINE yellow 04/22/2009 1626   APPEARANCEUR Clear 04/22/2009 1626   LABSPEC 1.015 04/22/2009 1626   PHURINE 6.5 04/22/2009 1626   HGBUR negative 04/22/2009 1626   BILIRUBINUR negative 04/22/2009 1626   UROBILINOGEN 0.2 04/22/2009 1626   NITRITE negative 04/22/2009 1626      Radiographic Studies: DG Chest 1 View  Result Date: 12/18/2020 CLINICAL DATA:  Dizziness and vomiting EXAM: CHEST  1 VIEW COMPARISON:  Chest radiograph March 26, 2020 FINDINGS: Prior median sternotomy and CABG with surgical clips along the left mediastinum. Aortic atherosclerosis. The heart size and mediastinal contours are within normal limits. Left pleural thickening. Biapical pleuroparenchymal scarring. No focal consolidation. No pleural effusion. The visualized skeletal structures are unremarkable. IMPRESSION: 1. No acute cardiopulmonary disease. 2. Aortic atherosclerosis. Electronically Signed   By: Dahlia Bailiff MD   On: 12/18/2020 14:31   CT Head Wo Contrast  Result Date: 12/18/2020 CLINICAL DATA:  Dizziness EXAM: CT HEAD WITHOUT CONTRAST TECHNIQUE: Contiguous axial images were obtained from the base of the skull through the vertex without intravenous contrast. COMPARISON:  None. FINDINGS: Brain: No evidence of acute large vascular territory infarction, hemorrhage, hydrocephalus, extra-axial collection or mass lesion/mass effect. Vascular: No hyperdense vessel. Atherosclerotic calcifications internal carotid arteries. Skull: Normal. Negative for fracture or focal lesion. Sinuses/Orbits: Paranasal sinuses are predominantly clear. Mastoid air cells are clear. Other: None IMPRESSION: No acute intracranial findings. Electronically Signed   By: Dahlia Bailiff MD   On: 12/18/2020 14:34   MR BRAIN WO CONTRAST  Result Date: 12/18/2020 CLINICAL DATA:  Neuro deficit, acute, stroke suspected EXAM: MRI HEAD WITHOUT CONTRAST TECHNIQUE: Multiplanar, multiecho pulse sequences  of the brain and surrounding structures were obtained without intravenous contrast. COMPARISON:  12/18/2020. FINDINGS: Brain: 5 mm acute right caudate head infarct. No intracranial hemorrhage. No midline shift, ventriculomegaly or extra-axial fluid collection. No mass lesion. Mild cerebral atrophy with ex vacuo dilatation. Moderate chronic microvascular ischemic changes. Chronic bilateral cerebellar and right pontine lacunar insults. Vascular:  Right V4 segment is not visualized and may terminate as PICA. Remaining major intracranial flow voids are preserved at the skull base. Skull and upper cervical spine: Normal marrow signal. Sinuses/Orbits: Sequela of bilateral lens replacement. Clear paranasal sinuses. Other: None. IMPRESSION: Acute 5 mm right caudate head infarct. Chronic bilateral cerebellar and right pontine lacunar insults. Mild cerebral atrophy and moderate chronic microvascular ischemic changes. These results were called by telephone at the time of interpretation on 12/18/2020 at 5:14 pm to provider [facility] , who verbally acknowledged these results. Electronically Signed   By: Primitivo Gauze M.D.   On: 12/18/2020 17:18    EKG: Independently reviewed.  Sinus rhythm at 75.  Patient does have a few different QRS morphologies possibly has some wandering atrial pacemaker.  He does have slight slurring of the QRS initial segment however PR interval is normal and not shortened.   Assessment/Plan:   Active Problems:   Stroke (cerebrum) ([place])  87 year old man with new caudate infarct.  Stroke Patient with new acute caudate infarct He is already on Eliquis for atrial fibrillation Await neurology recommendations for antiplatelet therapy as warranted CT angiogram has been ordered per neurology Echocardiogram has been ordered We will start atorvastatin 40 mg daily PT/OT ordered.  Nausea with dry heaves As needed Zofran has been very effective for management of nausea  Atrial  fibrillation Patient does not appear to be on any rate control agents for his atrial fibrillation, rate is well controlled off these.  Continue Eliquis 5 mg p.o. twice daily per home doses.  Dyslipidemia Hold simvastatin 20 mg, atorvastatin started as above.  Glaucoma Continue latanoprost  COPD Continue Spiriva and as needed albuterol inhaler    Other information:   DVT prophylaxis: Eliquis ordered. Code Status: Full Family Communication: Patient's daughter was at bedside throughout Disposition Plan: Home Consults called: Neurology Admission status: Observation  Blayton Huttner Tublu Nahlia Hellmann [facility]  If 7PM-7AM, please contact night-coverage [email or web address] Password [facility] 12/18/2020, 7:42 PM

## 2020-12-19 ENCOUNTER — Observation Stay (HOSPITAL_BASED_OUTPATIENT_CLINIC_OR_DEPARTMENT_OTHER): Payer: Medicare Other

## 2020-12-19 DIAGNOSIS — I639 Cerebral infarction, unspecified: Secondary | ICD-10-CM | POA: Diagnosis not present

## 2020-12-19 DIAGNOSIS — I6300 Cerebral infarction due to thrombosis of unspecified precerebral artery: Secondary | ICD-10-CM | POA: Diagnosis not present

## 2020-12-19 DIAGNOSIS — R112 Nausea with vomiting, unspecified: Secondary | ICD-10-CM

## 2020-12-19 DIAGNOSIS — I6389 Other cerebral infarction: Secondary | ICD-10-CM | POA: Diagnosis not present

## 2020-12-19 LAB — CBC
HCT: 41.2 % (ref 39.0–52.0)
Hemoglobin: 13.4 g/dL (ref 13.0–17.0)
MCH: 30.2 pg (ref 26.0–34.0)
MCHC: 32.5 g/dL (ref 30.0–36.0)
MCV: 92.8 fL (ref 80.0–100.0)
Platelets: 244 10*3/uL (ref 150–400)
RBC: 4.44 MIL/uL (ref 4.22–5.81)
RDW: 13.2 % (ref 11.5–15.5)
WBC: 8.9 10*3/uL (ref 4.0–10.5)
nRBC: 0 % (ref 0.0–0.2)

## 2020-12-19 LAB — ECHOCARDIOGRAM COMPLETE
AR max vel: 1.14 cm2
AV Area VTI: 1.34 cm2
AV Area mean vel: 1.21 cm2
AV Mean grad: 18 mmHg
AV Peak grad: 32.9 mmHg
Ao pk vel: 2.87 m/s
Area-P 1/2: 1.98 cm2
S' Lateral: 3.9 cm

## 2020-12-19 MED ORDER — ATORVASTATIN CALCIUM 40 MG PO TABS: 40.0000 mg | ORAL_TABLET | Freq: Every day | ORAL | Status: DC

## 2020-12-19 NOTE — Evaluation (Signed)
Speech Language Pathology Evaluation Patient Details Name: Fernando Walker MRN: [id number] DOB: 08-Dec-1932 Today's Date: 12/19/2020 Time: 6283-6629 SLP Time Calculation (min) (ACUTE ONLY): 29 min  Problem List:  Patient Active Problem List   Diagnosis Date Noted  . Stroke (cerebrum) ([place]) 12/18/2020  . COPD (chronic obstructive pulmonary disease) ([place]) 05/01/2020  . Aortic atherosclerosis ([place]) 03/27/2020  . Cough 03/26/2020  . Paroxysmal atrial fibrillation ([place]) 11/01/2019  . Tachycardia-bradycardia syndrome ([place]) 11/01/2019  . Dizziness 11/01/2019  . Aortic stenosis 07/31/2019  . Paroxysmal tachycardia, unspecified ([place]) 07/31/2019  . Coronary artery disease of native artery of native heart with stable angina pectoris ([place])   . GERD (gastroesophageal reflux disease)   . Dyslipidemia   . Carotid arterial disease ([place])   . CAD (coronary artery disease) of artery bypass graft   . Dupuytren's contracture of left hand 07/20/2017  . 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 Hx of adenomatous colonic polyps 07/27/2011  . Carotid bruit 06/02/2011  . FASTING HYPERGLYCEMIA 11/10/2009  . CAD, ARTERY BYPASS GRAFT 04/29/2009  . MYOCARDIAL PERFUSION SCAN, WITH STRESS TEST, ABNORMAL 04/29/2009  . Hyperlipidemia 04/24/2009  . HYPERTROPHY PROSTATE W/UR OBST & OTH LUTS 04/22/2009   Past Medical History:  Past Medical History:  Diagnosis Date  . CAD (coronary artery disease) of artery bypass graft   . Carotid arterial disease ([facility])    Nonobstructive  . Cataract   . Dyslipidemia   . GERD (gastroesophageal reflux disease)   . 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 Hyperlipidemia   . Hypertension   . Hypertrophy of prostate with urinary obstruction and other lower urinary tract symptoms (LUTS)   . Pneumonia    As a child  . Pneumonia    as child   Past Surgical History:  Past Surgical History:  Procedure Laterality Date  . cataracts      both eyes  . COLONOSCOPY    . colonoscopy with polypectomy  2004  . CORONARY ARTERY BYPASS GRAFT  1995   X 7  . FASCIECTOMY Left 11/29/2017   Procedure: SEGMENTAL FASCIECTOMY LEFT SMALL FINGER;  Surgeon: Daryll Brod, MD;  Location: [facility];  Service: Orthopedics;  Laterality: Left;  block and MAC  . LEFT HEART CATH AND CORS/GRAFTS ANGIOGRAPHY N/A 07/25/2019   Procedure: LEFT HEART CATH AND CORS/GRAFTS ANGIOGRAPHY;  Surgeon: Burnell Blanks, MD;  Location: [facility];  Service: Cardiovascular;  Laterality: N/A;  . POLYPECTOMY     HPI:  87 yo male adm to [facility] from  golf course with dizziness and nausea. Found to have cerbellar CVA- 5 mm  caudate infarct, chronic bilaterla cerebellar cva, right pontine lacunar infarct..  Pt has h/o afib, cataracts, allergies, rhinitis.   Pt with daughter Rodena Piety present.   Assessment / Plan / Recommendation Clinical Impression  Pt with suspected mild lingual deviation to left upon protrusion but no other focal cn deficits,  dysarthria nor asphasia.  SLUMS administered with pt scoring 26/30 due to difficulty with clock drawing *(forgetting number 12) and recall of specifics of paragraph.  Strengths included recall of five objects, problem solving, working Marine scientist.  SLP reviewed with pt and daughter importance to assure he is taking medications as needed give his stroke possibly causing attention difficulties.  Pt reviewed his procedure for taking his medications and he has a routine which he has modified to assure he takes them.  Reviewed with him and his daughter options for using smart phones, smart  medication boxies, etc to assure taking medications as needed due to high risk with pharmacologics.  Pt agreeable to have his medications "spot checked" to assure taking properly and daughter searched for programs, medicine boxes, etc online for compensations.  Pt report he is at his baseline function - no SLP follow up needed.  Thanks for this  consult.    SLP Assessment  SLP Recommendation/Assessment: Patient does not need any further [facility] [place] [facility] SLP Visit Diagnosis: Cognitive communication deficit (R41.841)    Follow Up Recommendations  None    Frequency and Duration   none        SLP Evaluation Cognition  Overall Cognitive Status: Within Functional Limits for tasks assessed Arousal/Alertness: Awake/alert Orientation Level: Oriented X4 Memory: Appears intact (5/5 words recalled independently) Awareness: Appears intact Problem Solving: Appears intact Safety/Judgment: Appears intact       Comprehension  Auditory Comprehension Overall Auditory Comprehension: Appears within functional limits for tasks assessed Yes/No Questions: Not tested Commands: Within Functional Limits Conversation: Complex Visual Recognition/Discrimination Discrimination: Within Function Limits Reading Comprehension Reading Status: Not tested    Expression Expression Primary Mode of Expression: Verbal Verbal Expression Overall Verbal Expression: Appears within functional limits for tasks assessed Initiation: No impairment Level of Generative/Spontaneous Verbalization: Conversation Repetition:  (dnt) Naming: Not tested Pragmatics: No impairment Non-Verbal Means of Communication: Not applicable Written Expression Dominant Hand: Right Written Expression: Not tested   Oral / Motor  Oral Motor/Sensory Function Overall Oral Motor/Sensory Function: Within functional limits (? minimal lingual deviation to left upon protrusion) Motor Speech Phonation: Normal Resonance: Within functional limits Articulation: Within functional limitis Intelligibility: Intelligible Motor Planning: Witnin functional limits Motor Speech Errors: Not applicable   GO                    Macario Golds 12/19/2020, 12:42 PM   Kathleen Lime, MS [facility] SLP Acute Rehab Services Office [phone] Pager [phone]

## 2020-12-19 NOTE — Evaluation (Signed)
Occupational Therapy Evaluation Patient Details Name: Fernando Walker MRN: [id number] DOB: [birth year] Today's Date: 12/19/2020    History of Present Illness 87 y/o male presented to ED on 3/3 after unsteady gait, loss of balance, and episode of vomiting while golfing in the AM. MRI revealed R caudate head infarct and chronic bilateral cerebellar and R pontine lacunar insults. PMH: Afib on Eliquis, HTN, HLD, CAD s/p CABG in 1995, GERD.   Clinical Impression   Mr. Khup Sapia is an 87 year old man who is independent, lives alone and active at baseline. On evaluation patient demonstrates normal ROM, strength and coordination of upper extremities. Patient demonstrates ability to perform bed mobility, ambulate without DME, and perform ADLs. Patient exhibits normal sitting and standing balance - able to reach down to floor and pick item up and stand on one leg while leaning on wall to don pants. Patient reports all symptoms resolved and does not exhibit any cognitive deficits or emotional disturbances at time of evaluation. Patient educated on signs and symptoms for future stroke. No further OT needs.     Follow Up Recommendations  No OT follow up    Equipment Recommendations  None recommended by OT    Recommendations for Other Services       Precautions / Restrictions Precautions Precautions: None Restrictions Weight Bearing Restrictions: No      Mobility Bed Mobility Overal bed mobility: Independent                  Transfers Overall transfer level: Independent               General transfer comment: Independent to perform bed mobility, ambulate in room without dme, reach down to floor and grab items.    Balance Overall balance assessment: No apparent balance deficits (not formally assessed)                                         ADL either performed or assessed with clinical judgement   ADL Overall ADL's : Independent                                              Vision Baseline Vision/History: Wears glasses Wears Glasses: At all times Patient Visual Report: No change from baseline       Perception     Praxis      Pertinent Vitals/Pain Pain Assessment: No/denies pain     Hand Dominance Right   Extremity/Trunk Assessment Upper Extremity Assessment Upper Extremity Assessment: RUE deficits/detail;LUE deficits/detail RUE Deficits / Details: WNL ROM and strength RUE Sensation: WNL RUE Coordination: WNL LUE Deficits / Details: WNL ROM and Strength LUE Sensation: WNL LUE Coordination: WNL   Lower Extremity Assessment Lower Extremity Assessment: Defer to PT evaluation   Cervical / Trunk Assessment Cervical / Trunk Assessment: Normal   Communication Communication Communication: No difficulties   Cognition Arousal/Alertness: Awake/alert Behavior During Therapy: WFL for tasks assessed/performed Overall Cognitive Status: Within Functional Limits for tasks assessed                                     General Comments       Exercises  Shoulder Instructions      Home Living Family/patient expects to be discharged to:: Private residence Living Arrangements: Alone Available Help at Discharge: Family;Available PRN/intermittently Type of Home: House Home Access: Stairs to enter [employer] of Steps: 2 Entrance Stairs-Rails: Right Home Layout: One level     Bathroom Shower/Tub: Occupational psychologist: Handicapped height     Home Equipment: Grab bars - tub/shower      Lives With: Alone    Prior Functioning/Environment Level of Independence: Independent                 OT Problem List:        OT Treatment/Interventions:      OT Goals(Current goals can be found in the care plan section) Acute Rehab OT Goals Patient Stated Goal: to go home OT Goal Formulation: All assessment and education complete, DC therapy  OT Frequency:      Barriers to D/C:            Co-evaluation              AM-PAC OT "6 Clicks" Daily Activity     Outcome Measure Help from another person eating meals?: None Help from another person taking care of personal grooming?: None Help from another person toileting, which includes using toliet, bedpan, or urinal?: None Help from another person bathing (including washing, rinsing, drying)?: None Help from another person to put on and taking off regular upper body clothing?: None Help from another person to put on and taking off regular lower body clothing?: None 6 Click Score: 24   End of Session Nurse Communication:  (Rn in room with OT)  Activity Tolerance: Patient tolerated treatment well Patient left: with family/visitor present  OT Visit Diagnosis: Dizziness and giddiness (R42)                Time: 7858-8502 OT Time Calculation (min): 21 min Charges:  OT General Charges $OT Visit: 1 Visit OT Evaluation $OT Eval Moderate Complexity: 1 Mod  Kriston Mckinnie, OTR/L [place]  Office [phone] Pager: [place] 12/19/2020, 12:51 PM

## 2020-12-19 NOTE — Evaluation (Signed)
Physical Therapy Evaluation Patient Details Name: Fernando Walker MRN: [id number] DOB: 1933/03/18 Today's Date: 12/19/2020   History of Present Illness  87 y/o male presented to ED on 3/3 after unsteady gait, loss of balance, and episode of vomiting while golfing in the AM. MRI revealed R caudate head infarct and chronic bilateral cerebellar and R pontine lacunar insults. PMH: Afib on Eliquis, HTN, HLD, CAD s/p CABG in 1995, GERD.  Clinical Impression  PTA, patient lives alone and reports independence with mobility. Patient overall independent with mobility and stair negotiation. No skilled acute PT needs required. No PT follow up recommended at this time.      Follow Up Recommendations No PT follow up    Equipment Recommendations  None recommended by PT    Recommendations for Other Services       Precautions / Restrictions Precautions Precautions: None Restrictions Weight Bearing Restrictions: No      Mobility  Bed Mobility Overal bed mobility: Independent                  Transfers Overall transfer level: Independent                  Ambulation/Gait Ambulation/Gait assistance: Independent Gait Distance (Feet): 300 Feet Assistive device: None Gait Pattern/deviations: WFL(Within Functional Limits)        Stairs Stairs: Yes Stairs assistance: Independent Stair Management: No rails;Alternating pattern;Forwards Number of Stairs: 2    Wheelchair Mobility    Modified Rankin (Stroke Patients Only) Modified Rankin (Stroke Patients Only) Pre-Morbid Rankin Score: No symptoms Modified Rankin: No symptoms     Balance Overall balance assessment: No apparent balance deficits (not formally assessed)                                           Pertinent Vitals/Pain Pain Assessment: No/denies pain    Home Living Family/patient expects to be discharged to:: Private residence Living Arrangements: Alone Available Help at Discharge:  Family;Available PRN/intermittently Type of Home: House Home Access: Stairs to enter Entrance Stairs-Rails: Right Entrance Stairs-Number of Steps: 2 Home Layout: One level Home Equipment: Grab bars - tub/shower      Prior Function Level of Independence: Independent               Hand Dominance        Extremity/Trunk Assessment   Upper Extremity Assessment Upper Extremity Assessment: Defer to OT evaluation    Lower Extremity Assessment Lower Extremity Assessment: Overall WFL for tasks assessed       Communication   Communication: No difficulties  Cognition Arousal/Alertness: Awake/alert Behavior During Therapy: WFL for tasks assessed/performed Overall Cognitive Status: Within Functional Limits for tasks assessed                                        General Comments      Exercises     Assessment/Plan    PT Assessment Patent does not need any further PT services  PT Problem List         PT Treatment Interventions      PT Goals (Current goals can be found in the Care Plan section)  Acute Rehab PT Goals Patient Stated Goal: to go home    Frequency     Barriers to discharge  Co-evaluation               AM-PAC PT "6 Clicks" Mobility  Outcome Measure Help needed turning from your back to your side while in a flat bed without using bedrails?: None Help needed moving from lying on your back to sitting on the side of a flat bed without using bedrails?: None Help needed moving to and from a bed to a chair (including a wheelchair)?: None Help needed standing up from a chair using your arms (e.g., wheelchair or bedside chair)?: None Help needed to walk in hospital room?: None Help needed climbing 3-5 steps with a railing? : None 6 Click Score: 24    End of Session   Activity Tolerance: Patient tolerated treatment well Patient left:  (In transport chair to vascular) Nurse Communication: Mobility status PT Visit  Diagnosis: Unsteadiness on feet (R26.81)    Time: 5789-7847 PT Time Calculation (min) (ACUTE ONLY): 19 min   Charges:   PT Evaluation $PT Eval Low Complexity: 1 Low          Lucky Alverson A. Gilford Rile PT, DPT Acute Rehabilitation Services Pager [phone] Office [phone]   Linna Hoff 12/19/2020, 8:59 AM

## 2020-12-19 NOTE — Plan of Care (Signed)

## 2020-12-19 NOTE — Discharge Summary (Signed)
Physician Discharge Summary  Fernando Walker WIO:[id number] DOB: 15-Apr-1933 DOA: 12/18/2020  PCP: Binnie Rail, MD  Admit date: 12/18/2020 Discharge date: 12/19/2020  Admitted From: home Disposition:  home  Recommendations for Outpatient Follow-up:  1. Follow up with PCP in 1-2 weeks 2. Follow-up with neurology in 2 months  Home Health: none Equipment/Devices: none  Discharge Condition: stable CODE STATUS: Full code Diet recommendation: heart healthy  HPI: Per admitting MD, Fernando Walker is an 87 y.o. male with PMH significant for atrial fibrillation on Eliquis, HL and COPD who was playing golf today when he had sudden onset of staggering gait and sensation of falling backward.  He was immediately dizzy and started having nausea with dry heaves.  He finished off the golf game by sitting in the golf cart however when the dizziness nausea and dry heaves did not resolve he came to the ED via EMS. Patient states he felt entirely well until he had that sudden onset of gait instability.  Has not had any recent fevers chills abdominal pain cough shortness of breath or urinary symptoms.  Hospital Course / Discharge diagnoses: Principal problem Acute CVA-patient was admitted to the hospital with acute CVA.  An MRI of the brain showed 5 mm caudate infarct.  Neurology consulted and followed patient while hospitalized.  Patient underwent the stroke work-up.  Carotid ultrasound showed 1-39% stenosis on the right and borderline 40% on the left.  A 2D echo showed normal EF 60 to 42%, grade 1 diastolic dysfunction, mild aortic stenosis.  RV systolic function is low normal with normal RV size.  Hemoglobin A1c was 5.9, lipid panel showed an LDL at goal of 68.  He is to continue statin.  Active problems: Paroxysmal A. Fib -CHA2DS2-VASc 5, on anticoagulation with Eliquis.  Currently in sinus rhythm Hyperlipidemia-continue statin, LDL at goal COPD-no wheezing, respiratory status stable, on room air,  continue home medications Glaucoma-continue home medications on discharge Nausea, vomiting-resolved, tolerating a diet  Sepsis ruled out  Discharge Instructions   Allergies as of 12/19/2020      Reactions   Sulfonamide Derivatives Rash      Medication List    TAKE these medications   albuterol 108 (90 Base) MCG/ACT inhaler Commonly known as: VENTOLIN HFA Inhale 2 puffs into the lungs every 6 (six) hours as needed for wheezing or shortness of breath.   Eliquis 5 MG Tabs tablet Generic drug: apixaban TAKE 1 TABLET BY MOUTH  TWICE DAILY What changed: how much to take   latanoprost 0.005 % ophthalmic solution Commonly known as: XALATAN Place 1 drop into both eyes at bedtime.   levocetirizine 5 MG tablet Commonly known as: Xyzal Take 1 tablet (5 mg total) by mouth every evening.   simvastatin 20 MG tablet Commonly known as: ZOCOR TAKE 1 TABLET BY MOUTH AT  BEDTIME What changed: when to take this   Spiriva Respimat 2.5 MCG/ACT Aers Generic drug: Tiotropium Bromide Monohydrate Inhale 2 puffs into the lungs daily.   triamcinolone ointment 0.1 % Commonly known as: KENALOG Apply 1 application topically 2 (two) times daily as needed (rash).   vitamin B-12 1000 MCG tablet Commonly known as: CYANOCOBALAMIN Take 1,000 mcg by mouth every evening.      Consultations:  Neurology   Procedures/Studies:  CT ANGIO HEAD W OR WO CONTRAST  Result Date: 12/18/2020 CLINICAL DATA:  Follow-up examination for acute stroke. EXAM: CT ANGIOGRAPHY HEAD AND NECK TECHNIQUE: Multidetector CT imaging of the head and neck was performed  using the standard protocol during bolus administration of intravenous contrast. Multiplanar CT image reconstructions and MIPs were obtained to evaluate the vascular anatomy. Carotid stenosis measurements (when applicable) are obtained utilizing NASCET criteria, using the distal internal carotid diameter as the denominator. CONTRAST:  166mL OMNIPAQUE IOHEXOL 350  MG/ML SOLN COMPARISON:  Prior CT and MRI from earlier the same day. FINDINGS: CTA NECK FINDINGS Aortic arch: Visualized aortic arch of normal caliber. Bovine branching pattern with common origin of the right brachiocephalic and left common carotid artery noted. Heavy atheromatous plaque about the arch and origin of the great vessels. Associated severe stenosis of up to 80% at the origin of the left subclavian artery (series 7, image 346). Right carotid system: Right CCA patent to the bifurcation without flow-limiting stenosis. Extensive bulky calcified plaque about the right bifurcation/proximal right ICA with associated stenosis of up to 65-70% by NASCET criteria. Right ICA patent distally without stenosis, dissection or occlusion. Left carotid system: Left CCA patent from its origin to the bifurcation without stenosis. Bulky calcified plaque about the left bifurcation/proximal left ICA with associated stenosis of up to 65-70% by NASCET criteria. Vertebral arteries: Both vertebral arteries arise from the subclavian arteries, with the left being dominant. Right vertebral artery diffusely hypoplastic. Extensive plaque at the origin of the right vertebral artery with associated severe near occlusive stenosis (series 7, image 295). Dominant left vertebral artery otherwise patent within the neck. Hypoplastic right vertebral artery patent as well. Skeleton: No visible acute osseous abnormality. No discrete or worrisome osseous lesions. Moderate multilevel cervical spondylosis without high-grade spinal stenosis. Prominent degenerative thickening and calcification noted about the dens and tectorial membrane. Median sternotomy wires noted. Other neck: No other acute soft tissue abnormality within the neck. No mass or adenopathy. Upper chest: Visualized upper chest demonstrates no acute finding. Sequelae of prior CABG partially visualized. Upper lobe predominant centrilobular emphysema. Review of the MIP images confirms the  above findings CTA HEAD FINDINGS Anterior circulation: Petrous segments patent bilaterally. Atheromatous plaque throughout the carotid siphons without high-grade stenosis. A1 segments patent bilaterally. Normal anterior communicating artery complex. Anterior cerebral arteries patent their distal aspects without stenosis. Normal in stenosis or occlusion. Normal MCA bifurcations. Distal MCA branches well perfused and symmetric. Posterior circulation: Mild atheromatous irregularity within the dominant left V4 segment without significant stenosis. Hypoplastic right vertebral artery terminates in PICA. Both PICAs are patent. Basilar patent to its distal aspect without stenosis. Superior cerebellar arteries patent bilaterally. Both PCAs supplied via the basilar as well as small bilateral posterior communicating arteries. Both PCAs well perfused to their distal aspects without high-grade stenosis. Venous sinuses: Patent. Anatomic variants: None significant.  No aneurysm. Review of the MIP images confirms the above findings IMPRESSION: 1. Negative CTA for large vessel occlusion. 2. Bulky calcified plaque about the carotid bifurcations/proximal ICAs with associated stenoses of up to 65-70% by NASCET criteria bilaterally. 3. Severe near occlusive stenosis at the origin of the dominant left vertebral artery. Right vertebral artery diffusely hypoplastic and terminates in PICA. 4. 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 5. Aortic Atherosclerosis (ICD10-I70.0) and Emphysema (ICD10-J43.9). Electronically Signed   By: Jeannine Boga M.D.   On: 12/18/2020 20:39   DG Chest 1 View  Result Date: 12/18/2020 CLINICAL DATA:  Dizziness and vomiting EXAM: CHEST  1 VIEW COMPARISON:  Chest radiograph March 26, 2020 FINDINGS: Prior median sternotomy and CABG with surgical clips along the left mediastinum. Aortic atherosclerosis. The heart size and mediastinal  contours are within normal limits. Left  pleural thickening. Biapical pleuroparenchymal scarring. No focal consolidation. No pleural effusion. The visualized skeletal structures are unremarkable. IMPRESSION: 1. No acute cardiopulmonary disease. 2. Aortic atherosclerosis. Electronically Signed   By: Dahlia Bailiff MD   On: 12/18/2020 14:31   CT Head Wo Contrast  Result Date: 12/18/2020 CLINICAL DATA:  Dizziness EXAM: CT HEAD WITHOUT CONTRAST TECHNIQUE: Contiguous axial images were obtained from the base of the skull through the vertex without intravenous contrast. COMPARISON:  None. FINDINGS: Brain: No evidence of acute large vascular territory infarction, hemorrhage, hydrocephalus, extra-axial collection or mass lesion/mass effect. Vascular: No hyperdense vessel. Atherosclerotic calcifications internal carotid arteries. Skull: Normal. Negative for fracture or focal lesion. Sinuses/Orbits: Paranasal sinuses are predominantly clear. Mastoid air cells are clear. Other: None IMPRESSION: No acute intracranial findings. Electronically Signed   By: Dahlia Bailiff MD   On: 12/18/2020 14:34   CT ANGIO NECK W OR WO CONTRAST  Result Date: 12/18/2020 CLINICAL DATA:  Follow-up examination for acute stroke. EXAM: CT ANGIOGRAPHY HEAD AND NECK TECHNIQUE: Multidetector CT imaging of the head and neck was performed using the standard protocol during bolus administration of intravenous contrast. Multiplanar CT image reconstructions and MIPs were obtained to evaluate the vascular anatomy. Carotid stenosis measurements (when applicable) are obtained utilizing NASCET criteria, using the distal internal carotid diameter as the denominator. CONTRAST:  185mL OMNIPAQUE IOHEXOL 350 MG/ML SOLN COMPARISON:  Prior CT and MRI from earlier the same day. FINDINGS: CTA NECK FINDINGS Aortic arch: Visualized aortic arch of normal caliber. Bovine branching pattern with common origin of the right brachiocephalic and left common carotid artery noted. Heavy atheromatous plaque about the  arch and origin of the great vessels. Associated severe stenosis of up to 80% at the origin of the left subclavian artery (series 7, image 346). Right carotid system: Right CCA patent to the bifurcation without flow-limiting stenosis. Extensive bulky calcified plaque about the right bifurcation/proximal right ICA with associated stenosis of up to 65-70% by NASCET criteria. Right ICA patent distally without stenosis, dissection or occlusion. Left carotid system: Left CCA patent from its origin to the bifurcation without stenosis. Bulky calcified plaque about the left bifurcation/proximal left ICA with associated stenosis of up to 65-70% by NASCET criteria. Vertebral arteries: Both vertebral arteries arise from the subclavian arteries, with the left being dominant. Right vertebral artery diffusely hypoplastic. Extensive plaque at the origin of the right vertebral artery with associated severe near occlusive stenosis (series 7, image 295). Dominant left vertebral artery otherwise patent within the neck. Hypoplastic right vertebral artery patent as well. Skeleton: No visible acute osseous abnormality. No discrete or worrisome osseous lesions. Moderate multilevel cervical spondylosis without high-grade spinal stenosis. Prominent degenerative thickening and calcification noted about the dens and tectorial membrane. Median sternotomy wires noted. Other neck: No other acute soft tissue abnormality within the neck. No mass or adenopathy. Upper chest: Visualized upper chest demonstrates no acute finding. Sequelae of prior CABG partially visualized. Upper lobe predominant centrilobular emphysema. Review of the MIP images confirms the above findings CTA HEAD FINDINGS Anterior circulation: Petrous segments patent bilaterally. Atheromatous plaque throughout the carotid siphons without high-grade stenosis. A1 segments patent bilaterally. Normal anterior communicating artery complex. Anterior cerebral arteries patent their distal  aspects without stenosis. Normal in stenosis or occlusion. Normal MCA bifurcations. Distal MCA branches well perfused and symmetric. Posterior circulation: Mild atheromatous irregularity within the dominant left V4 segment without significant stenosis. Hypoplastic right vertebral artery terminates in PICA. Both PICAs are patent.  Basilar patent to its distal aspect without stenosis. Superior cerebellar arteries patent bilaterally. Both PCAs supplied via the basilar as well as small bilateral posterior communicating arteries. Both PCAs well perfused to their distal aspects without high-grade stenosis. Venous sinuses: Patent. Anatomic variants: None significant.  No aneurysm. Review of the MIP images confirms the above findings IMPRESSION: 1. Negative CTA for large vessel occlusion. 2. Bulky calcified plaque about the carotid bifurcations/proximal ICAs with associated stenoses of up to 65-70% by NASCET criteria bilaterally. 3. Severe near occlusive stenosis at the origin of the dominant left vertebral artery. Right vertebral artery diffusely hypoplastic and terminates in PICA. 4. 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 5. Aortic Atherosclerosis (ICD10-I70.0) and Emphysema (ICD10-J43.9). Electronically Signed   By: Jeannine Boga M.D.   On: 12/18/2020 20:39   MR BRAIN WO CONTRAST  Result Date: 12/18/2020 CLINICAL DATA:  Neuro deficit, acute, stroke suspected EXAM: MRI HEAD WITHOUT CONTRAST TECHNIQUE: Multiplanar, multiecho pulse sequences of the brain and surrounding structures were obtained without intravenous contrast. COMPARISON:  12/18/2020. FINDINGS: Brain: 5 mm acute right caudate head infarct. No intracranial hemorrhage. No midline shift, ventriculomegaly or extra-axial fluid collection. No mass lesion. Mild cerebral atrophy with ex vacuo dilatation. Moderate chronic microvascular ischemic changes. Chronic bilateral cerebellar and right pontine lacunar insults. Vascular:  Right V4 segment is not visualized and may terminate as PICA. Remaining major intracranial flow voids are preserved at the skull base. Skull and upper cervical spine: Normal marrow signal. Sinuses/Orbits: Sequela of bilateral lens replacement. Clear paranasal sinuses. Other: None. IMPRESSION: Acute 5 mm right caudate head infarct. Chronic bilateral cerebellar and right pontine lacunar insults. Mild cerebral atrophy and moderate chronic microvascular ischemic changes. These results were called by telephone at the time of interpretation on 12/18/2020 at 5:14 pm to provider [facility] , who verbally acknowledged these results. Electronically Signed   By: Primitivo Gauze M.D.   On: 12/18/2020 17:18   ECHOCARDIOGRAM COMPLETE  Result Date: 12/19/2020    ECHOCARDIOGRAM REPORT   Patient Name:   ATANACIO MELNYK Date of Exam: 12/19/2020 Medical Rec #:  [id number]          Height:       75.0 in Accession #:    [id number]         Weight:       196.0 lb Date of Birth:  06-13-1933          BSA:          2.176 m Patient Age:    89 years           BP:           173/100 mmHg Patient Gender: M                  HR:           78 bpm. Exam Location:  Inpatient Procedure: 2D Echo Indications:    Stroke 163.9  History:        Patient has prior history of Echocardiogram examinations, most                 recent 07/20/2019. CAD, Prior CABG; Risk Factors:Hypertension and                 Dyslipidemia.  Sonographer:    Mikki Santee RDCS (AE) Referring Phys: [id number] [place]  1. Left ventricular ejection fraction, by estimation, is 60 to 65%. The left ventricle  has normal function. The left ventricle has no regional wall motion abnormalities. Left ventricular diastolic parameters are consistent with Grade I diastolic dysfunction (impaired relaxation).  2. Right ventricular systolic function is low normal. The right ventricular size is normal. Tricuspid regurgitation signal is inadequate for assessing PA  pressure.  3. The mitral valve is abnormal. Trivial mitral valve regurgitation.  4. The aortic valve is calcified. There is moderate calcification of the aortic valve. There is mild thickening of the aortic valve. Aortic valve regurgitation is not visualized. Moderate aortic valve stenosis. Aortic valve area, by VTI measures 1.34 cm. Aortic valve mean gradient measures 18.0 mmHg. Aortic valve Vmax measures 2.87 m/s.  5. The inferior vena cava is normal in size with <50% respiratory variability, suggesting right atrial pressure of 8 mmHg. Comparison(s): Changes from prior study are noted. 07/20/2019: LVEF 60-65%, mild LVH, mild aortic stenosis - mean gradient 13.4 mmHg. FINDINGS  Left Ventricle: Left ventricular ejection fraction, by estimation, is 60 to 65%. The left ventricle has normal function. The left ventricle has no regional wall motion abnormalities. The left ventricular internal cavity size was normal in size. There is  no left ventricular hypertrophy. Left ventricular diastolic parameters are consistent with Grade I diastolic dysfunction (impaired relaxation). Indeterminate filling pressures. Right Ventricle: The right ventricular size is normal. No increase in right ventricular wall thickness. Right ventricular systolic function is low normal. Tricuspid regurgitation signal is inadequate for assessing PA pressure. Left Atrium: Left atrial size was normal in size. Right Atrium: Right atrial size was normal in size. Pericardium: There is no evidence of pericardial effusion. Mitral Valve: The mitral valve is abnormal. Mild to moderate mitral annular calcification. Trivial mitral valve regurgitation. Tricuspid Valve: The tricuspid valve is not well visualized. Tricuspid valve regurgitation is not demonstrated. Aortic Valve: The aortic valve is calcified. There is moderate calcification of the aortic valve. There is mild thickening of the aortic valve. There is mild aortic valve annular calcification. Aortic  valve regurgitation is not visualized. Moderate aortic stenosis is present. Aortic valve mean gradient measures 18.0 mmHg. Aortic valve peak gradient measures 32.9 mmHg. Aortic valve area, by VTI measures 1.34 cm. Pulmonic Valve: The pulmonic valve was normal in structure. Pulmonic valve regurgitation is not visualized. Aorta: The aortic root and ascending aorta are structurally normal, with no evidence of dilitation. Venous: The inferior vena cava is normal in size with less than 50% respiratory variability, suggesting right atrial pressure of 8 mmHg. IAS/Shunts: No atrial level shunt detected by color flow Doppler.  LEFT VENTRICLE PLAX 2D LVIDd:         5.20 cm  Diastology LVIDs:         3.90 cm  LV e' medial:    7.94 cm/s LV PW:         1.10 cm  LV E/e' medial:  11.0 LV IVS:        1.00 cm  LV e' lateral:   8.65 cm/s LVOT diam:     2.10 cm  LV E/e' lateral: 10.1 LV SV:         79 LV SV Index:   36 LVOT Area:     3.46 cm  RIGHT VENTRICLE RV S prime:     10.20 cm/s TAPSE (M-mode): 1.4 cm LEFT ATRIUM             Index       RIGHT ATRIUM           Index LA diam:  3.00 cm 1.38 cm/m  RA Area:     11.90 cm LA Vol (A2C):   51.8 ml 23.81 ml/m RA Volume:   23.30 ml  10.71 ml/m LA Vol (A4C):   24.3 ml 11.17 ml/m LA Biplane Vol: 38.7 ml 17.79 ml/m  AORTIC VALVE AV Area (Vmax):    1.14 cm AV Area (Vmean):   1.21 cm AV Area (VTI):     1.34 cm AV Vmax:           287.00 cm/s AV Vmean:          201.000 cm/s AV VTI:            0.592 m AV Peak Grad:      32.9 mmHg AV Mean Grad:      18.0 mmHg LVOT Vmax:         94.80 cm/s LVOT Vmean:        70.500 cm/s LVOT VTI:          0.229 m LVOT/AV VTI ratio: 0.39  AORTA Ao Root diam: 2.90 cm MITRAL VALVE MV Area (PHT): 1.98 cm    SHUNTS MV Decel Time: 384 msec    Systemic VTI:  0.23 m MV E velocity: 87.40 cm/s  Systemic Diam: 2.10 cm MV A velocity: 84.80 cm/s MV E/A ratio:  1.03 Lyman Bishop MD Electronically signed by Lyman Bishop MD Signature Date/Time: 12/19/2020/11:21:02  AM    Final    VAS US CAROTID  Result Date: 12/19/2020 Carotid Arterial Duplex Study Indications:       CVA. Risk Factors:      Hyperlipidemia, past history of smoking, coronary artery                    disease. Comparison Study:  12-18-2020 CTA of neck showed bilateral ICA stenosis of up to                    65-70% by NASCET criteria. Performing Technologist: Darlin Coco RDMS, RVT  Examination Guidelines: A complete evaluation includes B-mode imaging, spectral Doppler, color Doppler, and power Doppler as needed of all accessible portions of each vessel. Bilateral testing is considered an integral part of a complete examination. Limited examinations for reoccurring indications may be performed as noted.  Right Carotid Findings: +----------+--------+--------+--------+------------------+---------------------+           PSV cm/sEDV cm/sStenosisPlaque DescriptionComments              +----------+--------+--------+--------+------------------+---------------------+ CCA Prox  146     33              calcific and                                                              irregular                               +----------+--------+--------+--------+------------------+---------------------+ CCA Mid                           calcific and  irregular                               +----------+--------+--------+--------+------------------+---------------------+ CCA Distal134     28              calcific and                                                              irregular                               +----------+--------+--------+--------+------------------+---------------------+ ICA Prox  133     28      1-39%                     Velocities may                                                            underestimate degree                                                      of stenosis  due to                                                        more proximal                                                             obstruction.          +----------+--------+--------+--------+------------------+---------------------+ ICA Mid   104     24                                                      +----------+--------+--------+--------+------------------+---------------------+ ICA Distal95      28                                                      +----------+--------+--------+--------+------------------+---------------------+ ECA       153     20                                                      +----------+--------+--------+--------+------------------+---------------------+ +----------+--------+-------+----------------+-------------------+  PSV cm/sEDV cmsDescribe        Arm Pressure (mmHG) +----------+--------+-------+----------------+-------------------+ [id number]            Multiphasic, WNL                    +----------+--------+-------+----------------+-------------------+ +---------+--------+--+--------+-+---------+ VertebralPSV cm/s51EDV cm/s8Antegrade +---------+--------+--+--------+-+---------+  Left Carotid Findings: +----------+--------+--------+--------+-------------------+--------------------+           PSV cm/sEDV cm/sStenosisPlaque Description Comments             +----------+--------+--------+--------+-------------------+--------------------+ CCA Prox  143     26              heterogenous                            +----------+--------+--------+--------+-------------------+--------------------+ CCA [id number]     27              calcific                                +----------+--------+--------+--------+-------------------+--------------------+ ICA Prox  163     40      1-39%   calcific and       Velocities may                                         heterogenous        underestimate degree                                                      of stenosis due to                                                        more proximal                                                             obstruction.         +----------+--------+--------+--------+-------------------+--------------------+ ICA Mid   147     40                                                      +----------+--------+--------+--------+-------------------+--------------------+ ICA Distal117     31                                                      +----------+--------+--------+--------+-------------------+--------------------+ ECA       138     12                                                      +----------+--------+--------+--------+-------------------+--------------------+ +----------+--------+--------+----------------+-------------------+  PSV cm/sEDV cm/sDescribe        Arm Pressure (mmHG) +----------+--------+--------+----------------+-------------------+ [id number]             Multiphasic, WNL                    +----------+--------+--------+----------------+-------------------+ +---------+--------+--+--------+--+---------+ VertebralPSV cm/s52EDV cm/s25Antegrade +---------+--------+--+--------+--+---------+   Summary: Right Carotid: Velocities in the right ICA are consistent with a 1-39% stenosis. Left Carotid: Velocities in the left ICA are consistent with a 1-39% stenosis.               Borderline 02% by end diastolic velocity. Vertebrals:  Bilateral vertebral arteries demonstrate antegrade flow. Subclavians: Normal flow hemodynamics were seen in bilateral subclavian              arteries. *See table(s) above for measurements and observations.     Preliminary       Subjective: - no chest pain, shortness of breath, no abdominal pain, nausea or vomiting.   Discharge Exam: BP (!) 173/100 (BP Location: Right Arm)   Pulse 73    Temp 98 F (36.7 C) (Oral)   Resp 16   SpO2 95%   General: Pt is alert, awake, not in acute distress Cardiovascular: RRR, S1/S2 +, no rubs, no gallops Respiratory: CTA bilaterally, no wheezing, no rhonchi Abdominal: Soft, NT, ND, bowel sounds + Extremities: no edema, no cyanosis   The results of significant diagnostics from this hospitalization (including imaging, microbiology, ancillary and laboratory) are listed below for reference.     Microbiology: Recent Results (from the past 240 hour(s))  SARS CORONAVIRUS 2 (TAT 6-24 HRS) Nasopharyngeal Nasopharyngeal Swab     Status: None   Collection Time: 12/18/20  1:53 PM   Specimen: Nasopharyngeal Swab  Result Value Ref Range Status   SARS Coronavirus 2 NEGATIVE NEGATIVE Final    Comment: (NOTE) SARS-CoV-2 target nucleic acids are NOT DETECTED.  The SARS-CoV-2 RNA is generally detectable in upper and lower respiratory specimens during the acute phase of infection. Negative results do not preclude SARS-CoV-2 infection, do not rule out co-infections with other pathogens, and should not be used as the sole basis for treatment or other patient management decisions. Negative results must be combined with clinical observations, patient history, and epidemiological information. The expected result is Negative.  Fact Sheet for Patients: SugarRoll.be  Fact Sheet for Healthcare Providers: https://www.woods-mathews.com/  This test is not yet approved or cleared by the [place] FDA and  has been authorized for detection and/or diagnosis of SARS-CoV-2 by FDA under an Emergency Use Authorization (EUA). This EUA will remain  in effect (meaning this test can be used) for the duration of the COVID-19 declaration under Se ction 564(b)(1) of the Act, 21 U.S.C. section 360bbb-3(b)(1), unless the authorization is terminated or revoked sooner.  Performed at [facility], [place] [address]  [place], [place] [address]      Labs: Basic Metabolic Panel: Recent Labs  Lab 12/18/20 1351  NA 138  K 4.1  CL 104  CO2 23  GLUCOSE 154*  BUN 13  CREATININE 1.21  CALCIUM 9.3   Liver Function Tests: Recent Labs  Lab 12/18/20 1351  AST 23  ALT 20  ALKPHOS 37*  BILITOT 1.8*  PROT 6.8  ALBUMIN 3.7   CBC: Recent Labs  Lab 12/19/20 0435  WBC 8.9  HGB 13.4  HCT 41.2  MCV 92.8  PLT 244   CBG: No results for input(s): GLUCAP in the last 168 hours. Hgb A1c Recent  Labs    12/18/20 1839  HGBA1C 5.9*   Lipid Profile Recent Labs    12/18/20 1839  CHOL 148  HDL 68  LDLCALC 68  TRIG 60  CHOLHDL 2.2   Thyroid function studies No results for input(s): TSH, T4TOTAL, T3FREE, THYROIDAB in the last 72 hours.  Invalid input(s): FREET3 Urinalysis    Component Value Date/Time   COLORURINE yellow 04/22/2009 1626   APPEARANCEUR Clear 04/22/2009 1626   LABSPEC 1.015 04/22/2009 1626   PHURINE 6.5 04/22/2009 1626   HGBUR negative 04/22/2009 1626   BILIRUBINUR negative 04/22/2009 1626   UROBILINOGEN 0.2 04/22/2009 1626   NITRITE negative 04/22/2009 1626    FURTHER DISCHARGE INSTRUCTIONS:   Get Medicines reviewed and adjusted: Please take all your medications with you for your next visit with your Primary MD   Laboratory/radiological data: Please request your Primary MD to go over all hospital tests and procedure/radiological results at the follow up, please ask your Primary MD to get all Hospital records sent to his/her office.   In some cases, they will be blood work, cultures and biopsy results pending at the time of your discharge. Please request that your primary care M.D. goes through all the records of your hospital data and follows up on these results.   Also Note the following: If you experience worsening of your admission symptoms, develop shortness of breath, life threatening emergency, suicidal or homicidal thoughts you must seek medical attention  immediately by calling 911 or calling your MD immediately  if symptoms less severe.   You must read complete instructions/literature along with all the possible adverse reactions/side effects for all the Medicines you take and that have been prescribed to you. Take any new Medicines after you have completely understood and accpet all the possible adverse reactions/side effects.    Do not drive when taking Pain medications or sleeping medications (Benzodaizepines)   Do not take more than prescribed Pain, Sleep and Anxiety Medications. It is not advisable to combine anxiety,sleep and pain medications without talking with your primary care practitioner   Special Instructions: If you have smoked or chewed Tobacco  in the last 2 yrs please stop smoking, stop any regular Alcohol  and or any Recreational drug use.   Wear Seat belts while driving.   Please note: You were cared for by a hospitalist during your hospital stay. Once you are discharged, your primary care physician will handle any further medical issues. Please note that NO REFILLS for any discharge medications will be authorized once you are discharged, as it is imperative that you return to your primary care physician (or establish a relationship with a primary care physician if you do not have one) for your post hospital discharge needs so that they can reassess your need for medications and monitor your lab values.  Time coordinating discharge: 40 minutes  SIGNED:  Marzetta Board, MD, PhD 12/19/2020, 11:37 AM

## 2020-12-19 NOTE — Progress Notes (Addendum)
STROKE TEAM PROGRESS NOTE   INTERVAL HISTORY His daughter is at the bedside.  He reports that yesterday he was playing golf. He had just placed the tee and ball in the ground and when he straightened up became dizzy and began stumbling backwards. The people he was playing with caught him and help him sit down in the golf cart and he thought this would pass soon as he has had some previous dizziness spells. However, he began to dry heave and threw up. His son was called and he said to call 911 and he was brought to the hospital. He was found to have a 5 mm right caudate infarct.  He reports that today he is feeling good with no deficits from his stroke. He is asking when he can discharge. His neuro exam is normal.   Hospitalists are primary  Vitals:   12/18/20 2319 12/19/20 0200 12/19/20 0319 12/19/20 0835  BP: (!) 149/63 (!) 146/72 (!) 144/61 (!) 173/100  Pulse: 77 74 79 73  Resp: 18 18 18 16   Temp: 97.6 F (36.4 C) 97.9 F (36.6 C) 97.9 F (36.6 C) 98 F (36.7 C)  TempSrc: Oral Oral Oral Oral  SpO2: 92% 96% 95% 95%   CBC:  Recent Labs  Lab 12/19/20 0435  WBC 8.9  HGB 13.4  HCT 41.2  MCV 92.8  PLT 321   Basic Metabolic Panel:  Recent Labs  Lab 12/18/20 1351  NA 138  K 4.1  CL 104  CO2 23  GLUCOSE 154*  BUN 13  CREATININE 1.21  CALCIUM 9.3   Lipid Panel:  Recent Labs  Lab 12/18/20 1839  CHOL 148  TRIG 60  HDL 68  CHOLHDL 2.2  VLDL 12  LDLCALC 68   HgbA1c:  Recent Labs  Lab 12/18/20 1839  HGBA1C 5.9*   Urine Drug Screen: No results for input(s): LABOPIA, COCAINSCRNUR, LABBENZ, AMPHETMU, THCU, LABBARB in the last 168 hours.  Alcohol Level No results for input(s): ETH in the last 168 hours.  IMAGING past 24 hours CT ANGIO HEAD W OR WO CONTRAST  Result Date: 12/18/2020 CLINICAL DATA:  Follow-up examination for acute stroke. EXAM: CT ANGIOGRAPHY HEAD AND NECK TECHNIQUE: Multidetector CT imaging of the head and neck was performed using the standard protocol  during bolus administration of intravenous contrast. Multiplanar CT image reconstructions and MIPs were obtained to evaluate the vascular anatomy. Carotid stenosis measurements (when applicable) are obtained utilizing NASCET criteria, using the distal internal carotid diameter as the denominator. CONTRAST:  172mL OMNIPAQUE IOHEXOL 350 MG/ML SOLN COMPARISON:  Prior CT and MRI from earlier the same day. FINDINGS: CTA NECK FINDINGS Aortic arch: Visualized aortic arch of normal caliber. Bovine branching pattern with common origin of the right brachiocephalic and left common carotid artery noted. Heavy atheromatous plaque about the arch and origin of the great vessels. Associated severe stenosis of up to 80% at the origin of the left subclavian artery (series 7, image 346). Right carotid system: Right CCA patent to the bifurcation without flow-limiting stenosis. Extensive bulky calcified plaque about the right bifurcation/proximal right ICA with associated stenosis of up to 65-70% by NASCET criteria. Right ICA patent distally without stenosis, dissection or occlusion. Left carotid system: Left CCA patent from its origin to the bifurcation without stenosis. Bulky calcified plaque about the left bifurcation/proximal left ICA with associated stenosis of up to 65-70% by NASCET criteria. Vertebral arteries: Both vertebral arteries arise from the subclavian arteries, with the left being dominant. Right vertebral artery  diffusely hypoplastic. Extensive plaque at the origin of the right vertebral artery with associated severe near occlusive stenosis (series 7, image 295). Dominant left vertebral artery otherwise patent within the neck. Hypoplastic right vertebral artery patent as well. Skeleton: No visible acute osseous abnormality. No discrete or worrisome osseous lesions. Moderate multilevel cervical spondylosis without high-grade spinal stenosis. Prominent degenerative thickening and calcification noted about the dens and  tectorial membrane. Median sternotomy wires noted. Other neck: No other acute soft tissue abnormality within the neck. No mass or adenopathy. Upper chest: Visualized upper chest demonstrates no acute finding. Sequelae of prior CABG partially visualized. Upper lobe predominant centrilobular emphysema. Review of the MIP images confirms the above findings CTA HEAD FINDINGS Anterior circulation: Petrous segments patent bilaterally. Atheromatous plaque throughout the carotid siphons without high-grade stenosis. A1 segments patent bilaterally. Normal anterior communicating artery complex. Anterior cerebral arteries patent their distal aspects without stenosis. Normal in stenosis or occlusion. Normal MCA bifurcations. Distal MCA branches well perfused and symmetric. Posterior circulation: Mild atheromatous irregularity within the dominant left V4 segment without significant stenosis. Hypoplastic right vertebral artery terminates in PICA. Both PICAs are patent. Basilar patent to its distal aspect without stenosis. Superior cerebellar arteries patent bilaterally. Both PCAs supplied via the basilar as well as small bilateral posterior communicating arteries. Both PCAs well perfused to their distal aspects without high-grade stenosis. Venous sinuses: Patent. Anatomic variants: None significant.  No aneurysm. Review of the MIP images confirms the above findings IMPRESSION: 1. Negative CTA for large vessel occlusion. 2. Bulky calcified plaque about the carotid bifurcations/proximal ICAs with associated stenoses of up to 65-70% by NASCET criteria bilaterally. 3. Severe near occlusive stenosis at the origin of the dominant left vertebral artery. Right vertebral artery diffusely hypoplastic and terminates in PICA. 4. 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 5. Aortic Atherosclerosis (ICD10-I70.0) and Emphysema (ICD10-J43.9). Electronically Signed   By: Jeannine Boga M.D.   On: 12/18/2020  20:39   DG Chest 1 View  Result Date: 12/18/2020 CLINICAL DATA:  Dizziness and vomiting EXAM: CHEST  1 VIEW COMPARISON:  Chest radiograph March 26, 2020 FINDINGS: Prior median sternotomy and CABG with surgical clips along the left mediastinum. Aortic atherosclerosis. The heart size and mediastinal contours are within normal limits. Left pleural thickening. Biapical pleuroparenchymal scarring. No focal consolidation. No pleural effusion. The visualized skeletal structures are unremarkable. IMPRESSION: 1. No acute cardiopulmonary disease. 2. Aortic atherosclerosis. Electronically Signed   By: Dahlia Bailiff MD   On: 12/18/2020 14:31   CT Head Wo Contrast  Result Date: 12/18/2020 CLINICAL DATA:  Dizziness EXAM: CT HEAD WITHOUT CONTRAST TECHNIQUE: Contiguous axial images were obtained from the base of the skull through the vertex without intravenous contrast. COMPARISON:  None. FINDINGS: Brain: No evidence of acute large vascular territory infarction, hemorrhage, hydrocephalus, extra-axial collection or mass lesion/mass effect. Vascular: No hyperdense vessel. Atherosclerotic calcifications internal carotid arteries. Skull: Normal. Negative for fracture or focal lesion. Sinuses/Orbits: Paranasal sinuses are predominantly clear. Mastoid air cells are clear. Other: None IMPRESSION: No acute intracranial findings. Electronically Signed   By: Dahlia Bailiff MD   On: 12/18/2020 14:34   CT ANGIO NECK W OR WO CONTRAST  Result Date: 12/18/2020 CLINICAL DATA:  Follow-up examination for acute stroke. EXAM: CT ANGIOGRAPHY HEAD AND NECK TECHNIQUE: Multidetector CT imaging of the head and neck was performed using the standard protocol during bolus administration of intravenous contrast. Multiplanar CT image reconstructions and MIPs were obtained to evaluate the  vascular anatomy. Carotid stenosis measurements (when applicable) are obtained utilizing NASCET criteria, using the distal internal carotid diameter as the denominator.  CONTRAST:  160mL OMNIPAQUE IOHEXOL 350 MG/ML SOLN COMPARISON:  Prior CT and MRI from earlier the same day. FINDINGS: CTA NECK FINDINGS Aortic arch: Visualized aortic arch of normal caliber. Bovine branching pattern with common origin of the right brachiocephalic and left common carotid artery noted. Heavy atheromatous plaque about the arch and origin of the great vessels. Associated severe stenosis of up to 80% at the origin of the left subclavian artery (series 7, image 346). Right carotid system: Right CCA patent to the bifurcation without flow-limiting stenosis. Extensive bulky calcified plaque about the right bifurcation/proximal right ICA with associated stenosis of up to 65-70% by NASCET criteria. Right ICA patent distally without stenosis, dissection or occlusion. Left carotid system: Left CCA patent from its origin to the bifurcation without stenosis. Bulky calcified plaque about the left bifurcation/proximal left ICA with associated stenosis of up to 65-70% by NASCET criteria. Vertebral arteries: Both vertebral arteries arise from the subclavian arteries, with the left being dominant. Right vertebral artery diffusely hypoplastic. Extensive plaque at the origin of the right vertebral artery with associated severe near occlusive stenosis (series 7, image 295). Dominant left vertebral artery otherwise patent within the neck. Hypoplastic right vertebral artery patent as well. Skeleton: No visible acute osseous abnormality. No discrete or worrisome osseous lesions. Moderate multilevel cervical spondylosis without high-grade spinal stenosis. Prominent degenerative thickening and calcification noted about the dens and tectorial membrane. Median sternotomy wires noted. Other neck: No other acute soft tissue abnormality within the neck. No mass or adenopathy. Upper chest: Visualized upper chest demonstrates no acute finding. Sequelae of prior CABG partially visualized. Upper lobe predominant centrilobular emphysema.  Review of the MIP images confirms the above findings CTA HEAD FINDINGS Anterior circulation: Petrous segments patent bilaterally. Atheromatous plaque throughout the carotid siphons without high-grade stenosis. A1 segments patent bilaterally. Normal anterior communicating artery complex. Anterior cerebral arteries patent their distal aspects without stenosis. Normal in stenosis or occlusion. Normal MCA bifurcations. Distal MCA branches well perfused and symmetric. Posterior circulation: Mild atheromatous irregularity within the dominant left V4 segment without significant stenosis. Hypoplastic right vertebral artery terminates in PICA. Both PICAs are patent. Basilar patent to its distal aspect without stenosis. Superior cerebellar arteries patent bilaterally. Both PCAs supplied via the basilar as well as small bilateral posterior communicating arteries. Both PCAs well perfused to their distal aspects without high-grade stenosis. Venous sinuses: Patent. Anatomic variants: None significant.  No aneurysm. Review of the MIP images confirms the above findings IMPRESSION: 1. Negative CTA for large vessel occlusion. 2. Bulky calcified plaque about the carotid bifurcations/proximal ICAs with associated stenoses of up to 65-70% by NASCET criteria bilaterally. 3. Severe near occlusive stenosis at the origin of the dominant left vertebral artery. Right vertebral artery diffusely hypoplastic and terminates in PICA. 4. 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 5. Aortic Atherosclerosis (ICD10-I70.0) and Emphysema (ICD10-J43.9). Electronically Signed   By: Jeannine Boga M.D.   On: 12/18/2020 20:39   MR BRAIN WO CONTRAST  Result Date: 12/18/2020 CLINICAL DATA:  Neuro deficit, acute, stroke suspected EXAM: MRI HEAD WITHOUT CONTRAST TECHNIQUE: Multiplanar, multiecho pulse sequences of the brain and surrounding structures were obtained without intravenous contrast. COMPARISON:  12/18/2020.  FINDINGS: Brain: 5 mm acute right caudate head infarct. No intracranial hemorrhage. No midline shift, ventriculomegaly or extra-axial fluid collection. No mass lesion. Mild cerebral atrophy  with ex vacuo dilatation. Moderate chronic microvascular ischemic changes. Chronic bilateral cerebellar and right pontine lacunar insults. Vascular: Right V4 segment is not visualized and may terminate as PICA. Remaining major intracranial flow voids are preserved at the skull base. Skull and upper cervical spine: Normal marrow signal. Sinuses/Orbits: Sequela of bilateral lens replacement. Clear paranasal sinuses. Other: None. IMPRESSION: Acute 5 mm right caudate head infarct. Chronic bilateral cerebellar and right pontine lacunar insults. Mild cerebral atrophy and moderate chronic microvascular ischemic changes. These results were called by telephone at the time of interpretation on 12/18/2020 at 5:14 pm to provider [facility] , who verbally acknowledged these results. Electronically Signed   By: Primitivo Gauze M.D.   On: 12/18/2020 17:18    PHYSICAL EXAM Blood pressure (!) 156/66, pulse 61, temperature 97.7 F (36.5 C), temperature source Oral, resp. rate 18, SpO2 97 %.  General: Alert and oriented, elderly Caucasian male, no apparent distress  Lungs: Symmetrical chest rise, no labored breathing  Cardio: Regular Rate and Rhythm  Neuro: Alert, oriented, thought content appropriate.  Speech fluent without evidence of aphasia.  Able to follow all commands without difficulty. Cranial Nerves: II:  Visual fields grossly normal, pupils equal, round, reactive to light and accommodation III,IV, VI: ptosis not present, extra-ocular motions intact bilaterally V,VII: smile symmetric, facial light touch sensation normal bilaterally VIII: hearing normal bilaterally IX,X: uvula rises symmetrically XI: bilateral shoulder shrug XII: midline tongue extension without atrophy or fasciculations  Motor: strength: Upper  and Lower Extremities: 5/5 bilaterally Tone and bulk:normal tone throughout; no atrophy noted Sensory: light touch intact throughout, bilaterally Cerebellar: normal finger-to-nose Gait: deferred    ASSESSMENT/PLAN Fernando Walker is a 87 y.o. male with history of atrial fibrillation on Eliquis, hypertension, hyperlipidemia, coronary artery disease s/p seven vessel CABG in 1995, and GERD presenting with unsteady gait, loss of balance, and an episode of vomiting while golfing. He has no deficits on neuro exam and PT/OT are not recommending follow up. As his Carotid Doppler suggests stenosis of 1-39% most likely the CTA findings are not accurate due to significant calcification, will not need Vascular consult. Will sign off at this point.  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 Bulky calcified plaque about the carotid bifurcations/proximal ICAs with associated stenoses of up to 65-70% by NASCET criteria bilaterally. Severe near occlusive stenosis at the origin of the dominant left vertebral artery. Right vertebral artery diffusely hypoplastic and terminates in PICA. 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  MRI- Acute 5 mm right caudate head infarct. Mild cerebral atrophy and moderate chronic microvascular ischemic changes.  Carotid Doppler- Velocities in Right and Left ICA consistent with 1-39% stenosis  2D Echo EF 60-65%, No wall motion abnormalities. Aortic Valve has moderate Calcification  LDL 68  HgbA1c 5.9  VTE prophylaxis - none able to move     Diet   Diet Heart Room service appropriate? Yes; Fluid consistency: Thin     Eliquis (apixaban) daily prior to admission, now on Eliquis (apixaban) daily.   Therapy recommendations:  No PT/OT/SLP follow up recommendations  Disposition:  Discharge  Home  Hypertension  Home meds:  None  Stable . Permissive hypertension (OK if < 220/120) but gradually normalize in 5-7 days . 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 Agree with note above.  Patient has atrial fibrillation and is on long-term Eliquis and has been compliant but presents with small cardiac lacunar infarct from small vessel disease.  CT angiogram suggest 60s to 70% calcific right carotid stenosis with ultrasound shows only 1-39% stenosis.  Recommend continue Eliquis and aggressive risk factor modification.  Long discussion with patient and wife and answered questions.  Discussed with Dr.Gherghe.  Greater than 50% time during this 35-minute visit was spent in counseling and coordination of care about lacunar stroke and answering questions.  Follow-up as an outpatient stroke clinic in 6 weeks.  Antony Contras, MD Medical Director [facility] Pager: [phone] 12/19/2020 3:31 PM    To contact Stroke Continuity provider, please refer to [email or web address] After hours, contact General Neurology

## 2020-12-19 NOTE — Progress Notes (Signed)
Carotid duplex bilateral study completed.   Please see CV Proc for preliminary results.   Peder Allums, RDMS, RVT  

## 2020-12-19 NOTE — Progress Notes (Signed)
  Echocardiogram 2D Echocardiogram has been performed.  Jennette Dubin 12/19/2020, 9:56 AM

## 2020-12-19 NOTE — Plan of Care (Signed)
  Problem: Education: Goal: Knowledge of General Education information will improve Description: Including pain rating scale, medication(s)/side effects and non-pharmacologic comfort measures 12/19/2020 1202 by Shelton Silvas, RN Outcome: Adequate for Discharge 12/19/2020 0926 by Shelton Silvas, RN Outcome: Progressing   Problem: Health Behavior/Discharge Planning: Goal: Ability to manage health-related needs will improve 12/19/2020 1202 by Shelton Silvas, RN Outcome: Adequate for Discharge 12/19/2020 0926 by Shelton Silvas, RN Outcome: Progressing   Problem: Clinical Measurements: Goal: Ability to maintain clinical measurements within normal limits will improve 12/19/2020 1202 by Shelton Silvas, RN Outcome: Adequate for Discharge 12/19/2020 0926 by Shelton Silvas, RN Outcome: Progressing Goal: Will remain free from infection 12/19/2020 1202 by Shelton Silvas, RN Outcome: Adequate for Discharge 12/19/2020 0926 by Shelton Silvas, RN Outcome: Progressing Goal: Diagnostic test results will improve 12/19/2020 1202 by Shelton Silvas, RN Outcome: Adequate for Discharge 12/19/2020 0926 by Shelton Silvas, RN Outcome: Progressing Goal: Respiratory complications will improve 12/19/2020 1202 by Shelton Silvas, RN Outcome: Adequate for Discharge 12/19/2020 0926 by Shelton Silvas, RN Outcome: Progressing Goal: Cardiovascular complication will be avoided 12/19/2020 1202 by Shelton Silvas, RN Outcome: Adequate for Discharge 12/19/2020 0926 by Shelton Silvas, RN Outcome: Progressing   Problem: Activity: Goal: Risk for activity intolerance will decrease 12/19/2020 1202 by Shelton Silvas, RN Outcome: Adequate for Discharge 12/19/2020 0926 by Shelton Silvas, RN Outcome: Progressing   Problem: Nutrition: Goal: Adequate nutrition will be maintained 12/19/2020 1202 by Shelton Silvas, RN Outcome: Adequate for Discharge 12/19/2020 0926 by Shelton Silvas, RN Outcome: Progressing   Problem:  Coping: Goal: Level of anxiety will decrease 12/19/2020 1202 by Shelton Silvas, RN Outcome: Adequate for Discharge 12/19/2020 0926 by Shelton Silvas, RN Outcome: Progressing   Problem: Elimination: Goal: Will not experience complications related to bowel motility 12/19/2020 1202 by Shelton Silvas, RN Outcome: Adequate for Discharge 12/19/2020 0926 by Shelton Silvas, RN Outcome: Progressing Goal: Will not experience complications related to urinary retention 12/19/2020 1202 by Shelton Silvas, RN Outcome: Adequate for Discharge 12/19/2020 0926 by Shelton Silvas, RN Outcome: Progressing   Problem: Pain Managment: Goal: General experience of comfort will improve 12/19/2020 1202 by Shelton Silvas, RN Outcome: Adequate for Discharge 12/19/2020 0926 by Shelton Silvas, RN Outcome: Progressing   Problem: Safety: Goal: Ability to remain free from injury will improve 12/19/2020 1202 by Shelton Silvas, RN Outcome: Adequate for Discharge 12/19/2020 0926 by Shelton Silvas, RN Outcome: Progressing   Problem: Skin Integrity: Goal: Risk for impaired skin integrity will decrease 12/19/2020 1202 by Shelton Silvas, RN Outcome: Adequate for Discharge 12/19/2020 0926 by Shelton Silvas, RN Outcome: Progressing   Problem: Education: Goal: Knowledge of disease or condition will improve 12/19/2020 1202 by Shelton Silvas, RN Outcome: Adequate for Discharge 12/19/2020 0926 by Shelton Silvas, RN Outcome: Progressing Goal: Knowledge of secondary prevention will improve 12/19/2020 1202 by Shelton Silvas, RN Outcome: Adequate for Discharge 12/19/2020 2395 by Shelton Silvas, RN Outcome: Progressing Goal: Knowledge of patient specific risk factors addressed and post discharge goals established will improve 12/19/2020 1202 by Shelton Silvas, RN Outcome: Adequate for Discharge 12/19/2020 0926 by Shelton Silvas, RN Outcome: Progressing

## 2020-12-19 NOTE — TOC Transition Note (Signed)
Transition of Care [facility]) - CM/SW Discharge Note   Patient Details  Name: Fernando Walker MRN: [id number] Date of Birth: [birth year]  [facility] CM/SW Contact:  Pollie Friar, RN Phone Number: 12/19/2020, 12:08 PM   Clinical Narrative:    Pt discharging home with self care. No needs per TOC.   Final next level of care: Home/Self Care Barriers to Discharge: No Barriers Identified   Patient Goals and CMS Choice        Discharge Placement                       Discharge Plan and Services                                     Social Determinants of Health (SDOH) Interventions     Readmission Risk Interventions No flowsheet data found.

## 2020-12-25 ENCOUNTER — Telehealth: Payer: Self-pay | Admitting: *Deleted

## 2020-12-25 NOTE — Telephone Encounter (Addendum)
Called pt in response to MyChart message to Dr. Angelena Form.  He was dc'd from [facility] on 12/19/20 with CVA.  Pt states he didn't realize it at the time but had same symptoms last month at home.  Got over it after about 5 minutes.  (gait/balance off, dizziness, nausea, dry heaves)  He does not have a neurology or PCP appointment yet.  I asked him to call PCP office today and gave him the number to [facility] Neuro office to schedule with neurology as well.   Dr. Angelena Form aware.  Moved up next f/u in cardiology.

## 2020-12-28 ENCOUNTER — Other Ambulatory Visit: Payer: Self-pay | Admitting: Cardiovascular Disease

## 2021-01-15 ENCOUNTER — Ambulatory Visit (INDEPENDENT_AMBULATORY_CARE_PROVIDER_SITE_OTHER): Payer: Medicare Other | Admitting: Cardiovascular Disease

## 2021-01-15 ENCOUNTER — Encounter: Payer: Self-pay | Admitting: Cardiovascular Disease

## 2021-01-15 ENCOUNTER — Other Ambulatory Visit: Payer: Self-pay

## 2021-01-15 VITALS — BP 146/86 | HR 66 | Ht 75.0 in | Wt 208.0 lb

## 2021-01-15 DIAGNOSIS — I2510 Atherosclerotic heart disease of native coronary artery without angina pectoris: Secondary | ICD-10-CM

## 2021-01-15 DIAGNOSIS — I350 Nonrheumatic aortic (valve) stenosis: Secondary | ICD-10-CM

## 2021-01-15 DIAGNOSIS — I10 Essential (primary) hypertension: Secondary | ICD-10-CM | POA: Diagnosis not present

## 2021-01-15 DIAGNOSIS — I480 Paroxysmal atrial fibrillation: Secondary | ICD-10-CM

## 2021-01-15 DIAGNOSIS — E785 Hyperlipidemia, unspecified: Secondary | ICD-10-CM

## 2021-01-15 NOTE — Patient Instructions (Signed)

## 2021-01-15 NOTE — Progress Notes (Signed)
Chief Complaint  Patient presents with  . Follow-up    CAD   History of Present Illness: 87 yo male with history of CAD, atrial fibrillation, mild aortic stenosis, HTN, stroke March 2022 and HLD here today for cardiac follow up. He underwent 7V CABG in 1995. Carotid artery dopplers November 2017 with mild bilateral disease. Echo January 2019 with normal LV function, LVEF=65-70%,  mild AS (mean gradient 13 mmHg). He was seen in our office September 2020 by Ermalinda Barrios, PA and c/o chest tightness. Cardiac cath 10/720 with 6/7 patent bypass grafts. The distal limb of the SVG to the RV marginal was chronically occluded. Echo 07/20/19 with normal LVEF and mild AS. Cardiac monitor October 2020 and showed atrial fibrillation with longest episode 1 hour. Stroke beginning of March 2022. MRI with 5 mm caudate infarct. Echo 12/19/20 with LVEF=60-65%. Trivial MR. Mild to moderate AS. Carotid artery dopplers with mild bilateral carotid artery disease.   He is here today for follow up. The patient denies any chest pain, dyspnea, palpitations, lower extremity edema, orthopnea, PND, dizziness, near syncope or syncope.    Primary Care Physician: Binnie Rail, MD  Past Medical History:  Diagnosis Date  . CAD (coronary artery disease) of artery bypass graft   . Carotid arterial disease (HCC)    Nonobstructive  . Cataract   . Dyslipidemia   . GERD (gastroesophageal reflux disease)   . 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 Hyperlipidemia   . Hypertension   . Hypertrophy of prostate with urinary obstruction and other lower urinary tract symptoms (LUTS)   . Pneumonia    As a child  . Pneumonia    as child    Past Surgical History:  Procedure Laterality Date  . cataracts     both eyes  . COLONOSCOPY    . colonoscopy with polypectomy  2004  . CORONARY ARTERY BYPASS GRAFT  1995   X 7  . FASCIECTOMY Left 11/29/2017   Procedure: SEGMENTAL FASCIECTOMY LEFT SMALL FINGER;  Surgeon: Daryll Brod, MD;  Location:  [place];  Service: Orthopedics;  Laterality: Left;  block and MAC  . LEFT HEART CATH AND CORS/GRAFTS ANGIOGRAPHY N/A 07/25/2019   Procedure: LEFT HEART CATH AND CORS/GRAFTS ANGIOGRAPHY;  Surgeon: Burnell Blanks, MD;  Location: [place] [facility];  Service: Cardiovascular;  Laterality: N/A;  . POLYPECTOMY      Current Outpatient Medications  Medication Sig Dispense Refill  . albuterol (VENTOLIN HFA) 108 (90 Base) MCG/ACT inhaler Inhale 2 puffs into the lungs every 6 (six) hours as needed for wheezing or shortness of breath. 18 g 6  . ELIQUIS 5 MG TABS tablet TAKE 1 TABLET BY MOUTH  TWICE DAILY 180 tablet 1  . latanoprost (XALATAN) 0.005 % ophthalmic solution Place 1 drop into both eyes at bedtime.    Marland Kitchen levocetirizine (XYZAL) 5 MG tablet Take 1 tablet (5 mg total) by mouth every evening. 30 tablet 2  . simvastatin (ZOCOR) 20 MG tablet TAKE 1 TABLET BY MOUTH AT  BEDTIME 90 tablet 3  . Tiotropium Bromide Monohydrate (SPIRIVA RESPIMAT) 2.5 MCG/ACT AERS Inhale 2 puffs into the lungs daily. 12 g 2  . triamcinolone ointment (KENALOG) 0.1 % Apply 1 application topically 2 (two) times daily as needed (rash).    . vitamin B-12 (CYANOCOBALAMIN) 1000 MCG tablet Take 1,000 mcg by mouth every evening.     No current facility-administered medications for this visit.    Allergies  Allergen Reactions  . Sulfonamide  Derivatives Rash    Social History   Socioeconomic History  . Marital status: Widowed    Spouse name: Not on file  . Number of children: 2  . Years of education: 16  . Highest education level: Bachelor's degree (e.g., BA, AB, BS)  Occupational History  . Occupation: Retired  Tobacco Use  . Smoking status: Former Smoker    Packs/day: 4.00    Years: 30.00    Pack years: 120.00    Quit date: 10/18/1984    Years since quitting: 36.2  . Smokeless tobacco: Former Network engineer  . Vaping Use: Never used  Substance and Sexual Activity  . Alcohol use: Yes     Alcohol/week: 12.0 standard drinks    Types: 7 Glasses of wine, 5 Cans of beer per week  . Drug use: No  . Sexual activity: Not Currently  Other Topics Concern  . Not on file  Social History Narrative  . Not on file   Social Determinants of Health   Financial Resource Strain: Medium Risk  . Difficulty of Paying Living Expenses: Somewhat hard  Food Insecurity: No Food Insecurity  . Worried About Charity fundraiser in the Last Year: Never true  . Ran Out of Food in the Last Year: Never true  Transportation Needs: No Transportation Needs  . Lack of Transportation (Medical): No  . Lack of Transportation (Non-Medical): No  Physical Activity: Not on file  Stress: No Stress Concern Present  . Feeling of Stress : Not at all  Social Connections: Moderately Integrated  . Frequency of Communication with Friends and Family: More than three times a week  . Frequency of Social Gatherings with Friends and Family: More than three times a week  . Attends Religious Services: More than 4 times per year  . Active Member of Clubs or Organizations: Yes  . Attends Archivist Meetings: More than 4 times per year  . Marital Status: Widowed  Intimate Partner Violence: Unknown  . Fear of Current or Ex-Partner: Patient refused  . Emotionally Abused: Patient refused  . Physically Abused: Patient refused  . Sexually Abused: Patient refused    Family History  Problem Relation Age of Onset  . Stroke Father 69  . Diabetes Neg Hx   . Cancer Neg Hx   . GER disease Neg Hx   . Heart attack Neg Hx   . Colon cancer Neg Hx   . Esophageal cancer Neg Hx   . Stomach cancer Neg Hx   . Heart disease Neg Hx     Review of Systems:  As stated in the HPI and otherwise negative.   BP (!) 146/86   Pulse 66   Ht _0  (1.905 m)   Wt 208 lb (94.3 kg)   SpO2 98%   BMI 26.00 kg/m   Physical Examination: General: Well developed, well nourished, NAD  HEENT: OP clear, mucus membranes moist  SKIN:  warm, dry. No rashes. Neuro: No focal deficits  Musculoskeletal: Muscle strength 5/5 all ext  Psychiatric: Mood and affect normal  Neck: No JVD, no carotid bruits, no thyromegaly, no lymphadenopathy.  Lungs:Clear bilaterally, no wheezes, rhonci, crackles Cardiovascular: Regular rate and rhythm. Systolic murmur.  Abdomen:Soft. Bowel sounds present. Non-tender.  Extremities: No lower extremity edema. Pulses are 2 + in the bilateral DP/PT.  Echo March 2022: 1. Left ventricular ejection fraction, by estimation, is 60 to 65%. The  left ventricle has normal function. The left ventricle has no regional  wall motion abnormalities. Left ventricular diastolic parameters are  consistent with Grade I diastolic  dysfunction (impaired relaxation).  2. Right ventricular systolic function is low normal. The right  ventricular size is normal. Tricuspid regurgitation signal is inadequate  for assessing PA pressure.  3. The mitral valve is abnormal. Trivial mitral valve regurgitation.  4. The aortic valve is calcified. There is moderate calcification of the  aortic valve. There is mild thickening of the aortic valve. Aortic valve  regurgitation is not visualized. Moderate aortic valve stenosis. Aortic  valve area, by VTI measures 1.34  cm. Aortic valve mean gradient measures 18.0 mmHg. Aortic valve Vmax  measures 2.87 m/s.  5. The inferior vena cava is normal in size with <50% respiratory  variability, suggesting right atrial pressure of 8 mmHg.   Cardiac cath   1. Severe triple vessel CAD with chronic occlusion of the distal left main artery and the proximal RCA s/p 7V CABG with 6/7 patent bypass grafts 2. Patent LIMA to LAD 3. Patent sequential SVG to Diagonal 1 and Diagonal 2.  4. Patent sequential SVG to obtuse marginal 1 and obtuse marginal 2 5. Patent SVG to RV marginal branch with chronic occlusion of the distal sequential limb of the graft to the PDA.   Diagnostic Dominance:  Right  Intervention    EKG:  EKG is not ordered today. The ekg ordered today demonstrates   Recent Labs: 12/18/2020: ALT 20; BUN 13; Creatinine, Ser 1.21; Potassium 4.1; Sodium 138 12/19/2020: Hemoglobin 13.4; Platelets 244   Lipid Panel    Component Value Date/Time   CHOL 148 12/18/2020 1839   CHOL 142 01/22/2019 0801   TRIG 60 12/18/2020 1839   HDL 68 12/18/2020 1839   HDL 64 01/22/2019 0801   CHOLHDL 2.2 12/18/2020 1839   VLDL 12 12/18/2020 1839   LDLCALC 68 12/18/2020 1839   LDLCALC 67 01/22/2019 0801     Wt Readings from Last 3 Encounters:  01/15/21 208 lb (94.3 kg)  06/26/20 196 lb (88.9 kg)  05/01/20 193 lb 3.2 oz (87.6 kg)     Assessment and Plan:   1. CAD s/p CABG without angina: No chest pain. Cardiac cath October 2020 with stable CAD. Continue ASA and statin.      2. HTN: BP is controlled. No changes in therapy  3. Hyperlipidemia: LDL at goal in March 2022. Will continue statin.   4. Carotid artery disease: He has mild bilateral carotid artery disease by dopplers in November 2017 and then again March 2022. No plans to repeat given age and stability of disease over time.   5. Aortic stenosis: Moderate AS by echo March 2022.   6. Atrial fibrillation, paroxysmal:  He is in sinus today. CHADS VASC score is 4. He was seen by EP in January 2021. Plan to continue Eliquis. He stopped his metoprolol due to low blood pressures.   Current medicines are reviewed at length with the patient today.  The patient does not have concerns regarding medicines.  The following changes have been made:  no change  Labs/ tests ordered today include:   No orders of the defined types were placed in this encounter.   Disposition: Follow up with me in 12 months.   Signed, Lauree Chandler, MD 01/15/2021 2:50 PM    [facility] [address], [place], [place]  [address] Phone: [phone]; Fax: [phone]

## 2021-01-20 DIAGNOSIS — H401131 Primary open-angle glaucoma, bilateral, mild stage: Secondary | ICD-10-CM | POA: Diagnosis not present

## 2021-02-20 ENCOUNTER — Ambulatory Visit: Payer: Medicare Other | Admitting: Cardiovascular Disease

## 2021-02-26 NOTE — Progress Notes (Signed)
Subjective:    Patient ID: Fernando Walker, male    DOB: 1933-05-17, 87 y.o.   MRN: [id number]  HPI The patient is here for an acute visit.    It feels like he is walking on water balloons - his feet feel squishy.  He noticed this just after he started using the spiriva.  His feet are always cold.     Having sore throat, dysphonia from spiriva.  No change in stamina or shortness of breath.  He still has some sob - no change.  He denies any coughing or wheezing.  He does state some decrease sensation in his feet since he had his vein harvested in both legs for his bypass.  This has been there since then and has not changed.  He denies any numbness or tingling in his feet.    Medications and allergies reviewed with patient and updated if appropriate.  Patient Active Problem List   Diagnosis Date Noted  . Stroke (cerebrum) ([place]) 12/18/2020  . COPD (chronic obstructive pulmonary disease) ([place]) 05/01/2020  . Aortic atherosclerosis ([place]) 03/27/2020  . Cough 03/26/2020  . Paroxysmal atrial fibrillation ([place]) 11/01/2019  . Tachycardia-bradycardia syndrome ([place]) 11/01/2019  . Dizziness 11/01/2019  . Aortic stenosis 07/31/2019  . Paroxysmal tachycardia, unspecified ([place]) 07/31/2019  . Coronary artery disease of native artery of native heart with stable angina pectoris ([place])   . GERD (gastroesophageal reflux disease)   . Dyslipidemia   . Carotid arterial disease ([place])   . CAD (coronary artery disease) of artery bypass graft   . Dupuytren's contracture of left hand 07/20/2017  . 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 Hx of adenomatous colonic polyps 07/27/2011  . Carotid bruit 06/02/2011  . FASTING HYPERGLYCEMIA 11/10/2009  . CAD, ARTERY BYPASS GRAFT 04/29/2009  . MYOCARDIAL PERFUSION SCAN, WITH STRESS TEST, ABNORMAL 04/29/2009  . Hyperlipidemia 04/24/2009  . HYPERTROPHY PROSTATE W/UR OBST & OTH LUTS 04/22/2009    Current  Outpatient Medications on File Prior to Visit  Medication Sig Dispense Refill  . ELIQUIS 5 MG TABS tablet TAKE 1 TABLET BY MOUTH  TWICE DAILY 180 tablet 1  . latanoprost (XALATAN) 0.005 % ophthalmic solution Place 1 drop into both eyes at bedtime.    . simvastatin (ZOCOR) 20 MG tablet TAKE 1 TABLET BY MOUTH AT  BEDTIME 90 tablet 3  . Tiotropium Bromide Monohydrate (SPIRIVA RESPIMAT) 2.5 MCG/ACT AERS Inhale 2 puffs into the lungs daily. 12 g 2  . triamcinolone ointment (KENALOG) 0.1 % Apply 1 application topically 2 (two) times daily as needed (rash).    . vitamin B-12 (CYANOCOBALAMIN) 1000 MCG tablet Take 1,000 mcg by mouth every evening.     No current facility-administered medications on file prior to visit.    Past Medical History:  Diagnosis Date  . CAD (coronary artery disease) of artery bypass graft   . Carotid arterial disease (HCC)    Nonobstructive  . Cataract   . Dyslipidemia   . GERD (gastroesophageal reflux disease)   . 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 Hyperlipidemia   . Hypertension   . Hypertrophy of prostate with urinary obstruction and other lower urinary tract symptoms (LUTS)   . Pneumonia    As a child  . Pneumonia    as child    Past Surgical History:  Procedure Laterality Date  . cataracts     both eyes  . COLONOSCOPY    . colonoscopy with polypectomy  2004  .  CORONARY ARTERY BYPASS GRAFT  1995   X 7  . FASCIECTOMY Left 11/29/2017   Procedure: SEGMENTAL FASCIECTOMY LEFT SMALL FINGER;  Surgeon: Daryll Brod, MD;  Location: [facility];  Service: Orthopedics;  Laterality: Left;  block and MAC  . LEFT HEART CATH AND CORS/GRAFTS ANGIOGRAPHY N/A 07/25/2019   Procedure: LEFT HEART CATH AND CORS/GRAFTS ANGIOGRAPHY;  Surgeon: Burnell Blanks, MD;  Location: [facility];  Service: Cardiovascular;  Laterality: N/A;  . POLYPECTOMY      Social History   Socioeconomic History  . Marital status: Widowed    Spouse name: Not on file  .  Number of children: 2  . Years of education: 16  . Highest education level: Bachelor's degree (e.g., BA, AB, BS)  Occupational History  . Occupation: Retired  Tobacco Use  . Smoking status: Former Smoker    Packs/day: 4.00    Years: 30.00    Pack years: 120.00    Quit date: 10/18/1984    Years since quitting: 36.3  . Smokeless tobacco: Former Network engineer  . Vaping Use: Never used  Substance and Sexual Activity  . Alcohol use: Yes    Alcohol/week: 12.0 standard drinks    Types: 7 Glasses of wine, 5 Cans of beer per week  . Drug use: No  . Sexual activity: Not Currently  Other Topics Concern  . Not on file  Social History Narrative  . Not on file   Social Determinants of Health   Financial Resource Strain: Medium Risk  . Difficulty of Paying Living Expenses: Somewhat hard  Food Insecurity: No Food Insecurity  . Worried About Charity fundraiser in the Last Year: Never true  . Ran Out of Food in the Last Year: Never true  Transportation Needs: No Transportation Needs  . Lack of Transportation (Medical): No  . Lack of Transportation (Non-Medical): No  Physical Activity: Not on file  Stress: No Stress Concern Present  . Feeling of Stress : Not at all  Social Connections: Moderately Integrated  . Frequency of Communication with Friends and Family: More than three times a week  . Frequency of Social Gatherings with Friends and Family: More than three times a week  . Attends Religious Services: More than 4 times per year  . Active Member of Clubs or Organizations: Yes  . Attends Archivist Meetings: More than 4 times per year  . Marital Status: Widowed    Family History  Problem Relation Age of Onset  . Stroke Father 28  . Diabetes Neg Hx   . Cancer Neg Hx   . GER disease Neg Hx   . Heart attack Neg Hx   . Colon cancer Neg Hx   . Esophageal cancer Neg Hx   . Stomach cancer Neg Hx   . Heart disease Neg Hx     Review of Systems  Constitutional:  Negative for chills and fever.  HENT: Positive for sneezing (occ).   Respiratory: Positive for shortness of breath (with activity). Negative for cough and wheezing.   Cardiovascular: Negative for chest pain, palpitations and leg swelling (no feet swelling).  Neurological: Positive for light-headedness (mild - chronic). Negative for numbness and headaches.       Objective:   Vitals:   02/27/21 1512  BP: (!) 146/68  Pulse: 79  Temp: 98.5 F (36.9 C)  SpO2: 97%   BP Readings from Last 3 Encounters:  02/27/21 (!) 146/68  01/15/21 (!) 146/86  12/19/20 (!) 156/66   Wt Readings from Last 3 Encounters:  02/27/21 209 lb 6.4 oz (95 kg)  01/15/21 208 lb (94.3 kg)  06/26/20 196 lb (88.9 kg)   Body mass index is 26.17 kg/m.   Physical Exam    Constitutional: Appears well-developed and well-nourished. No distress.  Head: Normocephalic and atraumatic.  Neck: Neck supple. No tracheal deviation present. No thyromegaly present.  No cervical lymphadenopathy Cardiovascular: Normal rate, regular rhythm and normal heart sounds.  2/6 systolic murmur heard. .  No edema Pulmonary/Chest: Effort normal and breath sounds normal. No respiratory distress. No has no wheezes. No rales.   Neurological: Slightly decreased sensation plantar surface of left foot, normal sensation plantar surface of right foot Skin: Skin is warm and dry. Not diaphoretic.  Psychiatric: Normal mood and affect. Behavior is normal.      Assessment & Plan:    See Problem List for Assessment and Plan of chronic medical problems.    This visit occurred during the SARS-CoV-2 public health emergency.  Safety protocols were in place, including screening questions prior to the visit, additional usage of staff PPE, and extensive cleaning of exam room while observing appropriate contact time as indicated for disinfecting solutions.

## 2021-02-27 ENCOUNTER — Ambulatory Visit (INDEPENDENT_AMBULATORY_CARE_PROVIDER_SITE_OTHER): Payer: Medicare Other | Admitting: Internal Medicine

## 2021-02-27 ENCOUNTER — Other Ambulatory Visit: Payer: Self-pay

## 2021-02-27 ENCOUNTER — Encounter: Payer: Self-pay | Admitting: Internal Medicine

## 2021-02-27 VITALS — BP 146/68 | HR 79 | Temp 98.5°F | Ht 75.0 in | Wt 209.4 lb

## 2021-02-27 DIAGNOSIS — I2510 Atherosclerotic heart disease of native coronary artery without angina pectoris: Secondary | ICD-10-CM

## 2021-02-27 DIAGNOSIS — R209 Unspecified disturbances of skin sensation: Secondary | ICD-10-CM | POA: Diagnosis not present

## 2021-02-27 DIAGNOSIS — R490 Dysphonia: Secondary | ICD-10-CM

## 2021-02-27 DIAGNOSIS — J029 Acute pharyngitis, unspecified: Secondary | ICD-10-CM | POA: Insufficient documentation

## 2021-02-27 NOTE — Assessment & Plan Note (Signed)
Acute Does have some decreased sensation in his left foot more than right since his veins were harvested for his cardiac bypass surgery-this is chronic and unchanged What he is new is the water balloon or squishy feeling in his feet that started just after starting the Spiriva ?  Side effect from Spiriva versus new onset peripheral neuropathy He has not had much benefit from Spiriva so he will go ahead and stop that and hopefully will go away.  If it does not he will contact me so that I can refer him for further evaluation

## 2021-02-27 NOTE — Patient Instructions (Addendum)
   Stop the spiriva for now.  Your throat and voice symptoms should go away.    If your feet symptoms do not improve let me know so we can evaluate further.     Once your symptoms go away consider contacting Dr Lamonte Sakai to talk about alternative inhalers.

## 2021-02-27 NOTE — Assessment & Plan Note (Signed)
New problem Secondary to Spiriva Advised him to stop the medication since he is not seeing benefit from it I expect the pharyngitis to resolve and he can contact pulmonary to discuss other options once his symptoms resolve 

## 2021-02-27 NOTE — Assessment & Plan Note (Signed)
New problem Secondary to Spiriva Advised him to stop the medication since he is not seeing benefit from it I expect the pharyngitis to resolve and he can contact pulmonary to discuss other options once his symptoms resolve

## 2021-03-04 ENCOUNTER — Telehealth: Payer: Self-pay | Admitting: Pharmacist

## 2021-03-04 NOTE — Progress Notes (Signed)
    Chronic Care Management Pharmacy Assistant   Name: Fernando Walker  MRN: [id number] DOB: [birth year]    Reason for Encounter: Disease State General Call    Recent office visits:  02/27/21 Dr. Quay Burow, stopped spirva  Recent consult visits:  01/15/21 Dr. Angelena Form, no med changes  Hospital visits:  Medication Reconciliation was completed by comparing discharge summary, patient's EMR and Pharmacy list, and upon discussion with patient.  Admitted to the hospital on 12/18/20 due to dizziness. Discharge date was 12/19/20. [facility] -started None ID  Medication Changes at Hospital Discharge: -Changed None ID  Medications Discontinued at Hospital Discharge: -Stopped None ID  Medications that remain the same after Hospital Discharge:??  -All other medications will remain the same.    Medications: Outpatient Encounter Medications as of 03/04/2021  Medication Sig  . ELIQUIS 5 MG TABS tablet TAKE 1 TABLET BY MOUTH  TWICE DAILY  . latanoprost (XALATAN) 0.005 % ophthalmic solution Place 1 drop into both eyes at bedtime.  . simvastatin (ZOCOR) 20 MG tablet TAKE 1 TABLET BY MOUTH AT  BEDTIME  . Tiotropium Bromide Monohydrate (SPIRIVA RESPIMAT) 2.5 MCG/ACT AERS Inhale 2 puffs into the lungs daily.  Marland Kitchen triamcinolone ointment (KENALOG) 0.1 % Apply 1 application topically 2 (two) times daily as needed (rash).  . vitamin B-12 (CYANOCOBALAMIN) 1000 MCG tablet Take 1,000 mcg by mouth every evening.   No facility-administered encounter medications on file as of 03/04/2021.   Have you had any problems recently with your health? Patient states that he was seen at Dr. Quay Burow office on 02/27/21. Patient states that he was having some issues with his feet. He also stated that he was using spriva but started having some side effects  Sore throat so decided to stop taking.  Have you had any problems with your pharmacy? Patient  states that is does not have any problems with getting his medications or the cost of medications from the pharmacy  What issues or side effects are you having with your medications? Patient states that he was having a sore throat from spriva but has since stopped taking, waiting to see if throat clears up  What would you like me to pass along to [facility] for them to help you with?  Patient states that he does not have any concerns at this time. He did state that he will be seeing Dr. Quay Burow if his throat does not clear up  What can we do to take care of you better? Patient states that if he has any changes he will call Dr. Quay Burow office  Star Rating Drugs: Simvastatin 01/26/21 90 ds  [place] Pharmacist Assistant [phone]  Time spent:33

## 2021-03-05 ENCOUNTER — Telehealth: Payer: Self-pay | Admitting: Pharmacist

## 2021-03-05 NOTE — Progress Notes (Signed)
error 

## 2021-03-24 ENCOUNTER — Ambulatory Visit: Payer: Medicare Other

## 2021-03-24 ENCOUNTER — Telehealth: Payer: Self-pay | Admitting: Pharmacist

## 2021-03-24 NOTE — Telephone Encounter (Signed)
  Chronic Care Management   Outreach Note  03/24/2021 Name: Fernando Walker MRN: [id number] DOB: 07-17-1933  Referred by: Binnie Rail, MD  Patient had a phone appointment scheduled with clinical pharmacist today.  An unsuccessful telephone outreach was attempted today. The patient was referred to the pharmacist for assistance with care management and care coordination.   If possible, a message was left to return call to: [phone] or to [facility]: Belva, PharmD, Para March, CPP Clinical Pharmacist [facility] [phone]

## 2021-03-24 NOTE — Progress Notes (Unsigned)
Chronic Care Management Pharmacy Note  03/24/2021 Name:  Fernando Walker MRN:  [id number] DOB:  [birth year]  Summary: ***  Recommendations/Changes made from today's visit: ***  Plan: ***   Subjective: Fernando Walker is an 87 y.o. year old male who is a primary patient of Burns, Claudina Lick, MD.  The CCM team was consulted for assistance with disease management and care coordination needs.    Engaged with patient by telephone for follow up visit in response to provider referral for pharmacy case management and/or care coordination services.   Consent to Services:  The patient was given information about Chronic Care Management services, agreed to services, and gave verbal consent prior to initiation of services.  Please see initial visit note for detailed documentation.   Patient Care Team: Binnie Rail, MD as PCP - General (Internal Medicine) Burnell Blanks, MD as PCP - Cardiology (Cardiology) Charlton Haws, [facility] as Pharmacist (Pharmacist)  Recent office visits: 02/27/21 Dr Quay Burow OV: acute visit for sore throat, dysphonia, "squishy" feet that started after Spiriva; may be new onset neuropathy, he will stop Spiriva and monitor symptoms; advised to contact pulmonary for other options  Recent consult visits: 01/15/21 Dr Angelena Form (cardiology): f/u CAD, carotid disease; no plans to repeat dopplers; no med changes.  Hospital visits: Medication Reconciliation was completed by comparing discharge summary, patient's EMR and Pharmacy list, and upon discussion with patient.  Admitted to the hospital on 12/18/20 due to acute CVA. Discharge date was 12/19/20. Discharged from [facility].   MRI brain shoed caudate infarct. Carotid US mild-moderate stenosis.   Medications that remain the same after Hospital Discharge:??  -All medications will remain the same.    Objective:  Lab Results  Component Value Date   CREATININE 1.21 12/18/2020   BUN 13 12/18/2020    GFR 76.32 11/16/2019   GFRNONAA 59 (L) 12/18/2020   GFRAA 73 10/03/2019   NA 138 12/18/2020   K 4.1 12/18/2020   CALCIUM 9.3 12/18/2020   CO2 23 12/18/2020   GLUCOSE 154 (H) 12/18/2020    Lab Results  Component Value Date/Time   HGBA1C 5.9 (H) 12/18/2020 06:39 PM   HGBA1C 5.9 11/16/2019 11:46 AM   GFR 76.32 11/16/2019 11:46 AM   GFR 68.91 08/27/2016 10:07 AM   MICROALBUR 0.2 06/26/2014 10:24 AM    Last diabetic Eye exam: No results found for: HMDIABEYEEXA  Last diabetic Foot exam: No results found for: HMDIABFOOTEX   Lab Results  Component Value Date   CHOL 148 12/18/2020   HDL 68 12/18/2020   LDLCALC 68 12/18/2020   TRIG 60 12/18/2020   CHOLHDL 2.2 12/18/2020    Hepatic Function Latest Ref Rng & Units 12/18/2020 11/16/2019 01/22/2019  Total Protein 6.5 - 8.1 g/dL 6.8 7.3 6.5  Albumin 3.5 - 5.0 g/dL 3.7 4.1 4.3  AST 15 - 41 U/L _0 ALT 0 - 44 U/L _1 Alk Phosphatase 38 - 126 U/L 37(L) 52 41  Total Bilirubin 0.3 - 1.2 mg/dL 1.8(H) 1.1 1.2  Bilirubin, Direct 0.00 - 0.40 mg/dL - - -    Lab Results  Component Value Date/Time   TSH 3.14 11/16/2019 11:46 AM   TSH 1.98 11/12/2013 11:55 AM    CBC Latest Ref Rng & Units 12/19/2020 11/16/2019 10/03/2019  WBC 4.0 - 10.5 K/uL 8.9 9.2 9.8  Hemoglobin 13.0 - 17.0 g/dL 13.4 14.7 14.9  Hematocrit 39.0 - 52.0 % 41.2 44.2 45.5  Platelets 150 -  400 K/uL 244 337.0 278    No results found for: VD25OH  Clinical ASCVD: Yes  The ASCVD Risk score Mikey Bussing DC Jr., et al., 2013) failed to calculate for the following reasons:   The 2013 ASCVD risk score is only valid for ages 56 to 29   The patient has a prior MI or stroke diagnosis    Depression screen [facility] 2/9 02/27/2021 08/06/2019 03/06/2019  Decreased Interest 0 3 0  Down, Depressed, Hopeless 0 2 2  PHQ - 2 Score 0 5 2  Altered sleeping - 1 0  Tired, decreased energy - 1 1  Change in appetite - 0 0  Feeling bad or failure about yourself  - 0 0  Trouble concentrating - 0 0   Moving slowly or fidgety/restless - 1 0  Suicidal thoughts - 1 0  PHQ-9 Score - 9 3  Difficult doing work/chores - - Not difficult at all     CHA2DS2-VASc Score = 6  The patient's score is based upon: CHF History: No HTN History: Yes Diabetes History: No Stroke History: Yes Vascular Disease History: Yes Age Score: 2 Gender Score: 0  {Click here to calculate score.  REFRESH note before signing.       :[id number]}    Social History   Tobacco Use  Smoking Status Former Smoker  . Packs/day: 4.00  . Years: 30.00  . Pack years: 120.00  . Quit date: 10/18/1984  . Years since quitting: 36.4  Smokeless Tobacco Former User   BP Readings from Last 3 Encounters:  02/27/21 (!) 146/68  01/15/21 (!) 146/86  12/19/20 (!) 156/66   Pulse Readings from Last 3 Encounters:  02/27/21 79  01/15/21 66  12/19/20 61   Wt Readings from Last 3 Encounters:  02/27/21 209 lb 6.4 oz (95 kg)  01/15/21 208 lb (94.3 kg)  06/26/20 196 lb (88.9 kg)   BMI Readings from Last 3 Encounters:  02/27/21 26.17 kg/m  01/15/21 26.00 kg/m  06/26/20 24.50 kg/m    Assessment/Interventions: Review of patient past medical history, allergies, medications, health status, including review of consultants reports, laboratory and other test data, was performed as part of comprehensive evaluation and provision of chronic care management services.   SDOH:  (Social Determinants of Health) assessments and interventions performed: Yes  SDOH Screenings   Alcohol Screen: Not on file  Depression (PHQ2-9): Low Risk   . PHQ-2 Score: 0  Financial Resource Strain: Not on file  Food Insecurity: Not on file  Housing: Not on file  Physical Activity: Not on file  Social Connections: Not on file  Stress: Not on file  Tobacco Use: Medium Risk  . Smoking Tobacco Use: Former Smoker  . Smokeless Tobacco Use: Former Soil scientist Needs: Not on file    [place]  Allergies  Allergen Reactions  . Sulfonamide  Derivatives Rash    Medications Reviewed Today    Reviewed by Charlton Haws, [facility] (Pharmacist) on 03/12/21 at [phone]  Med List Status: <None>  Medication Order Taking? Sig Documenting Provider Last Dose Status Informant  ELIQUIS 5 MG TABS tablet [id number]  TAKE 1 TABLET BY MOUTH  TWICE DAILY Burnell Blanks, MD  Active   latanoprost (XALATAN) 0.005 % ophthalmic solution [id number]  Place 1 drop into both eyes at bedtime. [provider]  Active Self  simvastatin (ZOCOR) 20 MG tablet [id number]  TAKE 1 TABLET BY MOUTH AT  BEDTIME Burnell Blanks, MD  Active   Tiotropium Bromide  Monohydrate (SPIRIVA RESPIMAT) 2.5 MCG/ACT AERS [id number] No Inhale 2 puffs into the lungs daily.  Patient not taking: Reported on 03/12/2021   Collene Gobble, MD Not Taking Active Self           Med Note Joan Mayans Jan 15, 2021  2:09 PM)    triamcinolone ointment (KENALOG) 0.1 % [id number]  Apply 1 application topically 2 (two) times daily as needed (rash). [provider]  Active Self  vitamin B-12 (CYANOCOBALAMIN) 1000 MCG tablet [id number]  Take 1,000 mcg by mouth every evening. [provider]  Active Self          Patient Active Problem List   Diagnosis Date Noted  . Pharyngitis 02/27/2021  . Hoarseness 02/27/2021  . Altered sensation of foot 02/27/2021  . Stroke (cerebrum) ([place]) 12/18/2020  . COPD (chronic obstructive pulmonary disease) ([place]) 05/01/2020  . Aortic atherosclerosis ([place]) 03/27/2020  . Cough 03/26/2020  . Paroxysmal atrial fibrillation ([place]) 11/01/2019  . Tachycardia-bradycardia syndrome ([place]) 11/01/2019  . Dizziness 11/01/2019  . Aortic stenosis 07/31/2019  . Paroxysmal tachycardia, unspecified ([place]) 07/31/2019  . Coronary artery disease of native artery of native heart with stable angina pectoris ([place])   . GERD (gastroesophageal reflux disease)   . Dyslipidemia   . Carotid arterial disease ([place])   . CAD (coronary artery disease)  of artery bypass graft   . Dupuytren's contracture of left hand 07/20/2017  . 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 Hx of adenomatous colonic polyps 07/27/2011  . Carotid bruit 06/02/2011  . FASTING HYPERGLYCEMIA 11/10/2009  . CAD, ARTERY BYPASS GRAFT 04/29/2009  . MYOCARDIAL PERFUSION SCAN, WITH STRESS TEST, ABNORMAL 04/29/2009  . Hyperlipidemia 04/24/2009  . HYPERTROPHY PROSTATE W/UR OBST & OTH LUTS 04/22/2009    Immunization History  Administered Date(s) Administered  . Fluad Quad(high Dose 65+) 07/10/2019, 08/01/2020  . Influenza Split 07/27/2011, 08/03/2012  . Influenza Whole 09/07/2007, 07/24/2008, 07/23/2009, 08/07/2010  . Influenza, High Dose Seasonal PF 08/01/2013, 07/20/2017, 07/04/2018  . Influenza,inj,Quad PF,6+ Mos 06/26/2014, 08/04/2015  . PFIZER(Purple Top)SARS-COV-2 Vaccination 11/09/2019, 12/09/2019, 08/30/2020  . Pneumococcal Conjugate-13 11/03/2015  . Pneumococcal Polysaccharide-23 09/13/2012  . Tdap 04/27/2019    Conditions to be addressed/monitored:  Hypertension, Hyperlipidemia, Atrial Fibrillation, Coronary Artery Disease, COPD and Depression  There are no care plans that you recently modified to display for this patient.    Medication Assistance: {MEDASSISTANCEINFO:25044}  Compliance/Adherence/Medication fill history: Care Gaps: Shingrix  Star-Rating Drugs: Simvastatin - LF 01/26/21 x 90 ds  Patient's preferred pharmacy is:  [id number] [address], [place] [address] [place] [address] Phone: [phone] Fax: [phone]  [facility]  ([place], [place], [address] [place], [address] [place] [address] Phone: [phone] Fax: [phone]  Uses pill box? {Yes or If no, why not?:20788} Pt endorses ***% compliance  We discussed: {Pharmacy  options:24294} Patient decided to: {US Pharmacy Plan:23885}  Care Plan and Follow Up Patient Decision:  {FOLLOWUP:24991}  Plan: {CM FOLLOW UP QZES:92330}  ***    Current Barriers:  . {pharmacybarriers:24917}  Pharmacist Clinical Goal(s):  Marland Kitchen Patient will {PHARMACYGOALCHOICES:24921} through collaboration with PharmD and provider.   Interventions: . 1:1 collaboration with Binnie Rail, MD regarding development and update of comprehensive plan of care as evidenced by provider attestation and co-signature . Inter-disciplinary care team  collaboration (see longitudinal plan of care) . Comprehensive medication review performed; medication list updated in electronic medical record  AFIB / Hypertension   Patient is currently rate controlled. Checks HR at home with pulse ox Patient checks BP at home infrequently Patient home BP readings are ranging: n/a  Patient has failed these meds in past: trandolapril Patient is currently controlled on the following medications:   Eliquis 5 mg BID  We discussed: Pt c/o occasional dizziness, much improved since ensuring adequate hydration and standing slowly to prevent orthostasis. He goes through 16 oz water bottle 3-4 times daily.   Plan Continue current medications  Ensure adequate fluid intake  Hyperlipidemia / CAD   LDL goal < 70 CAD - hx CABG 1995  Patient has failed these meds in past: n/a Patient is currently controlled on the following medications:   simvastatin 20 mg HS  We discussed:  Cholesterol goals; benefits of simvastatin for ASCVD risk reduction  Plan: Continue current medications    COPD   Last spirometry score: 06/26/20 - FEV1 62% predicted, FEV1/FVC 0.65 Gold Grade: Gold 2 (FEV1 50-79%) Current COPD Classification:  A (low sx, <2 exacerbations/yr)  Patient has failed these meds in past: Spiriva (dysphonia) Patient is currently {CHL Controlled/[phone]} on the following medications:   . None  Using maintenance inhaler regularly? {yes/no:20286} Frequency of rescue inhaler use:  {CHL HP Upstream Pharm Inhaler WVPX:1062694854}  We discussed:  {CHL HP Upstream Pharmacy [phone]}  Plan: Continue {CHL HP Upstream Pharmacy OEVOJ:5009381829}  Depression/anxiety   Patient has failed these meds in past: hydroxyzine, sertraline Patient is currently controlled on the following medications:   No medications  We discussed: Pt has stayed off of sertraline for last few months and denies issues.   Plan: Continue to monitor for depressive symptoms  Glaucoma   Managed per Dr Gershon Crane  Patient has failed these meds in past: Lumigan (cost) Patient is currently controlled on the following medications:   Latanoprost 0.005% eye drops - HS  We discussed: last visit coordinated with eye doctor to switch Lumigan to latanoprost for cost savings; pt still had 3 month supply of Lumigan but is now filling latanoprost for <$8/3 months.  Plan: Continue current medications  Patient Goals/Self-Care Activities . Patient will:  - {pharmacypatientgoals:24919}  Follow Up Plan: {CM FOLLOW UP HBZJ:69678}

## 2021-03-30 ENCOUNTER — Other Ambulatory Visit: Payer: Self-pay

## 2021-03-30 MED ORDER — ELIQUIS 5 MG PO TABS: 1.0000 | ORAL_TABLET | Freq: Two times a day (BID) | ORAL | 1 refills | Status: DC

## 2021-03-30 NOTE — Telephone Encounter (Signed)
Pt's age 87, wt 95 kg, SCr 1.21, CrCl 58.88, last ov w/ CM 01/15/21.

## 2021-04-22 ENCOUNTER — Telehealth: Payer: Self-pay | Admitting: Pharmacist

## 2021-04-25 DIAGNOSIS — U071 COVID-19: Secondary | ICD-10-CM | POA: Insufficient documentation

## 2021-04-25 NOTE — Progress Notes (Signed)
Virtual Visit via Video Note  I connected with Fernando Walker on 04/25/21 at  2:40 PM EDT by a video enabled telemedicine application and verified that I am speaking with the correct person using two identifiers.   I discussed the limitations of evaluation and management by telemedicine and the availability of in person appointments. The patient expressed understanding and agreed to proceed.  Present for the visit:  Myself, Dr Billey Gosling, [name].  The patient is currently at home and I am in the office.    No referring provider.    History of Present Illness: He is here for an acute visit for covid.   He did a test Friday and it was positive.  His symptoms started Tuesday - He just felt not great Tuesday but thought it was just a bad day.Wednesday - he felt terrible.   His symptoms progressed from there.  He has a cough, some sob, sore throat, nasal congestion and fatigue.    Thursday he took albuterol.  He got a little better.  Friday he was bad again. Today he feels 80% better.   He took tylenol one night.  He has not taken any other medications.   Review of Systems  Constitutional:  Positive for malaise/fatigue. Negative for fever.  HENT:  Positive for congestion and sore throat.   Respiratory:  Positive for cough and shortness of breath. Negative for wheezing.   Musculoskeletal:  Negative for myalgias.  Neurological:  Negative for headaches.     Social History   Socioeconomic History   Marital status: Widowed    Spouse name: Not on file   Number of children: 2   Years of education: 16   Highest education level: Bachelor's degree (e.g., BA, AB, BS)  Occupational History   Occupation: Retired  Tobacco Use   Smoking status: Former    Packs/day: 4.00    Years: 30.00    Pack years: 120.00    Types: Cigarettes    Quit date: 10/18/1984    Years since quitting: 36.5   Smokeless tobacco: Former  Scientific laboratory technician Use: Never used  Substance and Sexual  Activity   Alcohol use: Yes    Alcohol/week: 12.0 standard drinks    Types: 7 Glasses of wine, 5 Cans of beer per week   Drug use: No   Sexual activity: Not Currently  Other Topics Concern   Not on file  Social History Narrative   Not on file   Social Determinants of Health   Financial Resource Strain: Not on file  Food Insecurity: Not on file  Transportation Needs: Not on file  Physical Activity: Not on file  Stress: Not on file  Social Connections: Not on file     Observations/Objective: Appears well in NAD Breathing normally  Skin appears warm and dry  Assessment and Plan:  See Problem List for Assessment and Plan of chronic medical problems.   Follow Up Instructions:    I discussed the assessment and treatment plan with the patient. The patient was provided an opportunity to ask questions and all were answered. The patient agreed with the plan and demonstrated an understanding of the instructions.   The patient was advised to call back or seek an in-person evaluation if the symptoms worsen or if the condition fails to improve as anticipated.    Binnie Rail, MD

## 2021-04-27 ENCOUNTER — Telehealth (INDEPENDENT_AMBULATORY_CARE_PROVIDER_SITE_OTHER): Payer: Medicare Other | Admitting: Internal Medicine

## 2021-04-27 ENCOUNTER — Encounter: Payer: Self-pay | Admitting: Internal Medicine

## 2021-04-27 DIAGNOSIS — U071 COVID-19: Secondary | ICD-10-CM

## 2021-04-27 DIAGNOSIS — I2510 Atherosclerotic heart disease of native coronary artery without angina pectoris: Secondary | ICD-10-CM | POA: Diagnosis not present

## 2021-04-27 NOTE — Progress Notes (Signed)
    Chronic Care Management Pharmacy Assistant   Name: PEARSON PICOU  MRN: [id number] DOB: [birth year]   Reason for Encounter: Disease State   Conditions to be addressed/monitored: General   Recent office visits:  None ID  Recent consult visits:  None ID  Hospital visits:  None in previous 6 months  Medications: Outpatient Encounter Medications as of 04/22/2021  Medication Sig   apixaban (ELIQUIS) 5 MG TABS tablet Take 1 tablet (5 mg total) by mouth 2 (two) times daily.   latanoprost (XALATAN) 0.005 % ophthalmic solution Place 1 drop into both eyes at bedtime.   simvastatin (ZOCOR) 20 MG tablet TAKE 1 TABLET BY MOUTH AT  BEDTIME   Tiotropium Bromide Monohydrate (SPIRIVA RESPIMAT) 2.5 MCG/ACT AERS Inhale 2 puffs into the lungs daily. (Patient not taking: Reported on 03/12/2021)   triamcinolone ointment (KENALOG) 0.1 % Apply 1 application topically 2 (two) times daily as needed (rash).   vitamin B-12 (CYANOCOBALAMIN) 1000 MCG tablet Take 1,000 mcg by mouth every evening.   No facility-administered encounter medications on file as of 04/22/2021.    Pharmacist Review  Have you had any problems recently with your health? Patient stated that he is not having any new issues with his health at this time. He did state that he is just getting over Covid and has a lingering cough but other than that he feels fine  Have you had any problems with your pharmacy? Patient states that he has not had any issue with getting his medications from the pharmacy or the cost of medications  What issues or side effects are you having with your medications? Patient states that at one time he was having side effects from and inhaler spriva he was taking but no longer using it  What would you like me to pass along to [facility] for them to help you with?  Patient states that he is doing well and does not have any concerns at this time. He does states that he has a video appt with Dr. Quay Burow  this afternoon and will address any concerns with her at that time  What can we do to take care of you better?  Patient states that when calling if he does not answer leave a message with a time and he will call back   Star Rating Drugs: Simvastatin 01/26/21 90 ds  Ethelene Hal Clinical Pharmacist Assistant [phone]   Time spent:29

## 2021-04-27 NOTE — Assessment & Plan Note (Signed)
Acute Mild- moderate symptoms  Out of the window of anti-viral medications, but symptoms have improved and we both agree no further treatment is needed Continue symptomatic treatment - albuterol prn, tylenol prn Rest, fluids Reviewed quarantine recommendations Call with questions/concerns

## 2021-07-12 ENCOUNTER — Encounter: Payer: Self-pay | Admitting: Internal Medicine

## 2021-07-22 ENCOUNTER — Other Ambulatory Visit: Payer: Self-pay

## 2021-07-22 ENCOUNTER — Ambulatory Visit (INDEPENDENT_AMBULATORY_CARE_PROVIDER_SITE_OTHER): Payer: Medicare Other

## 2021-07-22 DIAGNOSIS — Z23 Encounter for immunization: Secondary | ICD-10-CM

## 2021-07-27 ENCOUNTER — Telehealth: Payer: Self-pay

## 2021-07-27 NOTE — Progress Notes (Signed)
    Chronic Care Management Pharmacy Assistant   Name: SAGAN MASELLI  MRN: [id number] DOB: 1933/09/11   Reason for Encounter: Disease State   Conditions to be addressed/monitored: General   Recent office visits:  04/27/21 Binnie Rail, MD-Video (Covid)  Recent consult visits:  None ID  Hospital visits:  None in previous 6 months  Medications: Outpatient Encounter Medications as of 07/27/2021  Medication Sig   apixaban (ELIQUIS) 5 MG TABS tablet Take 1 tablet (5 mg total) by mouth 2 (two) times daily.   latanoprost (XALATAN) 0.005 % ophthalmic solution Place 1 drop into both eyes at bedtime.   simvastatin (ZOCOR) 20 MG tablet TAKE 1 TABLET BY MOUTH AT  BEDTIME   Tiotropium Bromide Monohydrate (SPIRIVA RESPIMAT) 2.5 MCG/ACT AERS Inhale 2 puffs into the lungs daily. (Patient not taking: Reported on 03/12/2021)   triamcinolone ointment (KENALOG) 0.1 % Apply 1 application topically 2 (two) times daily as needed (rash).   vitamin B-12 (CYANOCOBALAMIN) 1000 MCG tablet Take 1,000 mcg by mouth every evening.   No facility-administered encounter medications on file as of 07/27/2021.   Have you had any problems recently with your health? Patient reports that he is having some issues with the bottom of his feet. He reports that it feels like he is walking on bubble wrap and he does not know what is causing it. He also reports that he has a growth on the right side of his head just above the ear that he is concerned about  Have you had any problems with your pharmacy?Patient reports that he does not have any problems with getting his medication or the cost of his medications from the pharmacy  What issues or side effects are you having with your medications? Patient reports that he was having an allergic reaction to Spiriva so he no longer takes it  What would you like me to pass along to Rodena Goldmann for them to help you with? Patient states that he is doing well but will be  making an appointment soon to see Dr. Quay Burow  What can we do to take care of you better? Nothing at this time   [place] Pharmacist Assistant [phone]

## 2021-07-31 ENCOUNTER — Encounter: Payer: Self-pay | Admitting: Internal Medicine

## 2021-07-31 ENCOUNTER — Ambulatory Visit (INDEPENDENT_AMBULATORY_CARE_PROVIDER_SITE_OTHER): Payer: Medicare Other | Admitting: Internal Medicine

## 2021-07-31 ENCOUNTER — Other Ambulatory Visit: Payer: Self-pay

## 2021-07-31 DIAGNOSIS — I2510 Atherosclerotic heart disease of native coronary artery without angina pectoris: Secondary | ICD-10-CM | POA: Diagnosis not present

## 2021-07-31 DIAGNOSIS — R209 Unspecified disturbances of skin sensation: Secondary | ICD-10-CM

## 2021-07-31 DIAGNOSIS — D229 Melanocytic nevi, unspecified: Secondary | ICD-10-CM | POA: Diagnosis not present

## 2021-07-31 NOTE — Progress Notes (Signed)
Subjective:    Patient ID: Fernando Walker, male    DOB: [birth year], 87 y.o.   MRN: [id number]  This visit occurred during the SARS-CoV-2 public health emergency.  Safety protocols were in place, including screening questions prior to the visit, additional usage of staff PPE, and extensive cleaning of exam room while observing appropriate contact time as indicated for disinfecting solutions.    HPI The patient is here for an acute visit.  Mole on the side of his head - present for three months.  It has gotten larger.  He has picked it off and has continued to grow.   Balls of feet with pressure causes numbness.  Has not felt his toes in years.  No pain.   It has a water balloon feeling.  Wearing shoes helps and typically he always has something on his feet.  Medications and allergies reviewed with patient and updated if appropriate.  Patient Active Problem List   Diagnosis Date Noted   COVID 04/25/2021   Pharyngitis 02/27/2021   Hoarseness 02/27/2021   Altered sensation of foot 02/27/2021   Stroke (cerebrum) ([place]) 12/18/2020   COPD (chronic obstructive pulmonary disease) ([place]) 05/01/2020   Aortic atherosclerosis ([place]) 03/27/2020   Cough 03/26/2020   Paroxysmal atrial fibrillation ([place]) 11/01/2019   Tachycardia-bradycardia syndrome ([place]) 11/01/2019   Dizziness 11/01/2019   Aortic stenosis 07/31/2019   Paroxysmal tachycardia, unspecified ([place]) 07/31/2019   Coronary artery disease of native artery of native heart with stable angina pectoris (HCC)    GERD (gastroesophageal reflux disease)    Dyslipidemia    Carotid arterial disease (HCC)    CAD (coronary artery disease) of artery bypass graft    Dupuytren's contracture of left hand 07/20/2017   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 180 tablet 1   latanoprost (XALATAN) 0.005 % ophthalmic solution Place 1 drop into both eyes at bedtime.     simvastatin (ZOCOR) 20 MG tablet TAKE 1 TABLET BY MOUTH AT  BEDTIME 90 tablet 3   triamcinolone ointment (KENALOG) 0.1 % Apply 1 application topically 2 (two) times daily as needed (rash).     vitamin B-12 (CYANOCOBALAMIN) 1000 MCG tablet Take 1,000 mcg by mouth every evening.     Tiotropium Bromide Monohydrate (SPIRIVA RESPIMAT) 2.5 MCG/ACT AERS Inhale 2 puffs into the lungs daily. (Patient not taking: No sig reported) 12 g 2   No current facility-administered medications on file prior to visit.    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   Hyperlipidemia    Hypertension    Hypertrophy of prostate with urinary obstruction and other lower urinary tract symptoms (LUTS)    Pneumonia    As a child   Pneumonia    as child  Past Surgical History:  Procedure Laterality Date   cataracts     both eyes   COLONOSCOPY     colonoscopy with polypectomy  2004   CORONARY ARTERY BYPASS GRAFT  1995   X 7   FASCIECTOMY Left 11/29/2017   Procedure: SEGMENTAL FASCIECTOMY LEFT SMALL FINGER;  Surgeon: Daryll Brod, MD;  Location: [facility];  Service: Orthopedics;  Laterality: Left;  block and MAC   LEFT HEART CATH AND CORS/GRAFTS ANGIOGRAPHY N/A 07/25/2019   Procedure: LEFT HEART CATH AND CORS/GRAFTS ANGIOGRAPHY;  Surgeon:  Burnell Blanks, MD;  Location: [facility];  Service: Cardiovascular;  Laterality: N/A;   POLYPECTOMY      Social History   Socioeconomic History   Marital status: Widowed    Spouse name: Not on file   Number of children: 2   Years of education: 16   Highest education level: Bachelor's degree (e.g., BA, AB, BS)  Occupational History   Occupation: Retired  Tobacco Use   Smoking status: Former    Packs/day: 4.00    Years: 30.00    Pack years: 120.00    Types: Cigarettes    Quit date: 10/18/1984    Years since quitting: 36.8   Smokeless tobacco: Former  Scientific laboratory technician Use: Never used  Substance and Sexual Activity   Alcohol use: Yes    Alcohol/week: 12.0 standard drinks    Types: 7 Glasses of wine, 5 Cans of beer per week   Drug use: No   Sexual activity: Not Currently  Other Topics Concern   Not on file  Social History Narrative   Not on file   Social Determinants of Health   Financial Resource Strain: Not on file  Food Insecurity: Not on file  Transportation Needs: Not on file  Physical Activity: Not on file  Stress: Not on file  Social Connections: Not on file    Family History  Problem Relation Age of Onset   Stroke Father 60   Diabetes Neg Hx    Cancer Neg Hx    GER disease Neg Hx    Heart attack Neg Hx    Colon cancer Neg Hx    Esophageal cancer Neg Hx    Stomach cancer Neg Hx    Heart disease Neg Hx     Review of Systems     Objective:   Vitals:   07/31/21 1530  BP: 124/76  Pulse: 78  Temp: 98.1 F (36.7 C)  SpO2: 95%   BP Readings from Last 3 Encounters:  07/31/21 124/76  02/27/21 (!) 146/68  01/15/21 (!) 146/86   Wt Readings from Last 3 Encounters:  07/31/21 205 lb (93 kg)  02/27/21 209 lb 6.4 oz (95 kg)  01/15/21 208 lb (94.3 kg)   Body mass index is 25.62 kg/m.   Physical Exam Constitutional:      General: He is not in acute distress.    Appearance: Normal appearance. He is not ill-appearing.   Musculoskeletal:     Right lower leg: No edema.     Left lower leg: No edema.  Skin:    General: Skin is warm and dry.     Findings: Lesion (Pea-sized lesion on right side of head that is dry and crusty-possible keratosis versus squamous cell) present.  Neurological:     Mental Status: He is alert.     Sensory: Sensory deficit (Slight decrease sensation left plantar surface of foot-ball of foot) present.  Assessment & Plan:    See Problem List for Assessment and Plan of chronic medical problems.

## 2021-07-31 NOTE — Assessment & Plan Note (Signed)
Acute Concerning for skin cancer Will see his dermatologist

## 2021-07-31 NOTE — Assessment & Plan Note (Signed)
Acute on chronic He does have some decreased sensation in his toes that has been present for years and has not changed Now he is stating a decrease sensation and ballooning like feeling when he walks in the ball of his foot-initially he thought this was related to Spiriva, but after discontinuing that the symptoms have persisted Much better with wearing shoes, which he does 99% of the time No pain Discussed further evaluation by neurology-EMG for podiatry.  At this point he will continue doing what he is doing and monitor and if this gets worse he will let me know so I can refer him to podiatry first and then we can go from there

## 2021-07-31 NOTE — Patient Instructions (Addendum)
     Medications changes include :   none   If your foot symptoms persist or worsen and you want to see a podiatrist let me know.    Follow with Dr Ronnald Ramp for your mole.

## 2021-08-26 ENCOUNTER — Emergency Department (HOSPITAL_COMMUNITY)
Admission: EM | Admit: 2021-08-26 | Discharge: 2021-08-26 | Disposition: A | Discharge status: Home / Self Care | Payer: Medicare Other | Attending: Emergency Medicine | Admitting: Emergency Medicine

## 2021-08-26 ENCOUNTER — Emergency Department (HOSPITAL_COMMUNITY): Payer: Medicare Other

## 2021-08-26 DIAGNOSIS — R059 Cough, unspecified: Secondary | ICD-10-CM | POA: Insufficient documentation

## 2021-08-26 DIAGNOSIS — Z7901 Long term (current) use of anticoagulants: Secondary | ICD-10-CM | POA: Diagnosis not present

## 2021-08-26 DIAGNOSIS — Z8616 Personal history of COVID-19: Secondary | ICD-10-CM | POA: Diagnosis not present

## 2021-08-26 DIAGNOSIS — Z87891 Personal history of nicotine dependence: Secondary | ICD-10-CM | POA: Diagnosis not present

## 2021-08-26 DIAGNOSIS — R000 Tachycardia, unspecified: Secondary | ICD-10-CM | POA: Diagnosis not present

## 2021-08-26 DIAGNOSIS — I10 Essential (primary) hypertension: Secondary | ICD-10-CM | POA: Insufficient documentation

## 2021-08-26 DIAGNOSIS — Z79899 Other long term (current) drug therapy: Secondary | ICD-10-CM | POA: Diagnosis not present

## 2021-08-26 DIAGNOSIS — R002 Palpitations: Secondary | ICD-10-CM | POA: Diagnosis not present

## 2021-08-26 DIAGNOSIS — I4891 Unspecified atrial fibrillation: Secondary | ICD-10-CM | POA: Insufficient documentation

## 2021-08-26 DIAGNOSIS — I2510 Atherosclerotic heart disease of native coronary artery without angina pectoris: Secondary | ICD-10-CM | POA: Insufficient documentation

## 2021-08-26 DIAGNOSIS — Z951 Presence of aortocoronary bypass graft: Secondary | ICD-10-CM | POA: Insufficient documentation

## 2021-08-26 DIAGNOSIS — J449 Chronic obstructive pulmonary disease, unspecified: Secondary | ICD-10-CM | POA: Insufficient documentation

## 2021-08-26 DIAGNOSIS — R079 Chest pain, unspecified: Secondary | ICD-10-CM | POA: Diagnosis not present

## 2021-08-26 DIAGNOSIS — I499 Cardiac arrhythmia, unspecified: Secondary | ICD-10-CM | POA: Diagnosis not present

## 2021-08-26 DIAGNOSIS — R0789 Other chest pain: Secondary | ICD-10-CM | POA: Diagnosis not present

## 2021-08-26 LAB — BASIC METABOLIC PANEL
Anion gap: 9 (ref 5–15)
BUN: 11 mg/dL (ref 8–23)
CO2: 28 mmol/L (ref 22–32)
Calcium: 8.9 mg/dL (ref 8.9–10.3)
Chloride: 101 mmol/L (ref 98–111)
Creatinine, Ser: 1.17 mg/dL (ref 0.61–1.24)
GFR, Estimated: >60 mL/min (ref >60)
Glucose, Bld: 124 mg/dL — ABNORMAL HIGH (ref 70–99)
Potassium: 3.8 mmol/L (ref 3.5–5.1)
Sodium: 138 mmol/L (ref 135–145)

## 2021-08-26 LAB — CBC
HCT: 45.3 % (ref 39.0–52.0)
Hemoglobin: 14.9 g/dL (ref 13.0–17.0)
MCH: 30.2 pg (ref 26.0–34.0)
MCHC: 32.9 g/dL (ref 30.0–36.0)
MCV: 91.7 fL (ref 80.0–100.0)
Platelets: 340 10*3/uL (ref 150–400)
RBC: 4.94 MIL/uL (ref 4.22–5.81)
RDW: 13 % (ref 11.5–15.5)
WBC: 11.4 10*3/uL — ABNORMAL HIGH (ref 4.0–10.5)
nRBC: 0 % (ref 0.0–0.2)

## 2021-08-26 MED ORDER — LEVALBUTEROL TARTRATE 45 MCG/ACT IN AERO
2.0000 | INHALATION_SPRAY | Freq: Once | RESPIRATORY_TRACT | Status: AC
  Administered 2021-08-26: 2 via RESPIRATORY_TRACT
  Filled 2021-08-26: qty 15

## 2021-08-26 NOTE — ED Notes (Signed)
Discharge instructions reviewed with patient . Patient verbalized understanding of instructions. Follow-up care was reviewed. Patient ambulatory with steady gait. VSS upon discharge.

## 2021-08-26 NOTE — ED Provider Notes (Signed)
  Physical Exam  BP 139/71   Pulse 91   Temp 98 F (36.7 C) (Oral)   Resp 19   SpO2 92%   Physical Exam  ED Course/Procedures     Procedures  MDM  Received patient in signout.  Had been in A. fib with RVR for EMS but resolved prehospital.  Remains sinus rhythm.  However has had some shortness of breath and coughing.  Has had for a while now.  Albuterol had not helped at home and states his throat will tighten up when he takes it.  Xopenex inhaler given and felt better.  Not hypoxic.  States he has not felt this good in a while.  Discharge home with outpatient follow-up as needed       Davonna Belling, MD 08/26/21 [phone]

## 2021-08-26 NOTE — ED Triage Notes (Signed)
BIBEMS from home, heart palpations began 11/8 around 1600. Went to bed, woke up and still felt heart was racing. EMS found patient to be in Afib RVR 150's Significant cardiac hx, including Afib. Patient also presents with cough.  18mg  Adenosine with EMS, no improvement 10 mg dilt and converted with EMS  EMS Vitals: BP:120/72 HR:80's SPO2: 98% RA

## 2021-08-26 NOTE — ED Provider Notes (Signed)
[facility] EMERGENCY DEPARTMENT Provider Note  CSN: [id number] Arrival date & time: 08/26/21 0524  Chief Complaint(s) Palpitations  HPI Fernando Walker is a 87 y.o. male with a past medical history listed below including paroxysmal A. fib on Eliquis who presents to the emergency department for palpitations that began yesterday at 4 PM.  Palpitations did not resolve and got worse tonight causing him to feel short of breath and prompting the call to EMS.  When they arrived they noted patient was in A. fib or flutter RVR with rates in the 160s.  They attempted to give 18 mg of adenosine without improvement.  They gave a bolus of diltiazem which after 5 minutes controlled his rate and patient converted to normal sinus rhythm.  Patient currently now with heart rates in the 90s.  Patient denies any associated chest pain.  He denies recent fevers.  He does report 3 weeks of dry cough.  No nausea or vomiting.  No abdominal pain.  No diarrhea.  No change in medications.  No other physical complaints.  Patient denies using albuterol in over a year.  The history is provided by the patient.   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   Hyperlipidemia    Hypertension    Hypertrophy of prostate with urinary obstruction and other lower urinary tract symptoms (LUTS)    Pneumonia    As a child   Pneumonia    as child   Patient Active Problem List   Diagnosis Date Noted   Atypical mole 07/31/2021   COVID 04/25/2021   Pharyngitis 02/27/2021   Hoarseness 02/27/2021   Altered sensation of foot 02/27/2021   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/2020   COPD (chronic obstructive pulmonary disease) ([place]) 05/01/2020   Aortic atherosclerosis ([place]) 03/27/2020    Cough 03/26/2020   Paroxysmal atrial fibrillation ([place]) 11/01/2019   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   Dizziness 11/01/2019   Aortic stenosis 07/31/2019   Paroxysmal tachycardia, unspecified ([place]) 07/31/2019   Coronary artery disease of native artery of native heart with stable angina pectoris (HCC)    GERD (gastroesophageal reflux disease)    Dyslipidemia    Carotid arterial disease ([place])    CAD (coronary artery disease) of artery bypass graft    Dupuytren's contracture of left hand 07/20/2017   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 Authorizing Provider  apixaban (ELIQUIS) 5 MG TABS tablet Take 1 tablet (5 mg total) by mouth 2 (two) times daily. 03/30/21   Burnell Blanks, MD  latanoprost (XALATAN) 0.005 % ophthalmic solution Place 1 drop into both eyes at bedtime. 03/18/20   [provider]  simvastatin (ZOCOR) 20 MG tablet TAKE 1 TABLET BY MOUTH AT  BEDTIME 12/29/20   Burnell Blanks, MD  Tiotropium Bromide Monohydrate (SPIRIVA RESPIMAT) 2.5 MCG/ACT AERS Inhale 2 puffs into  the lungs daily. Patient not taking: No sig reported 12/16/20   Collene Gobble, MD  triamcinolone ointment (KENALOG) 0.1 % Apply 1 application topically 2 (two) times daily as needed (rash).    [provider]  vitamin B-12 (CYANOCOBALAMIN) 1000 MCG tablet Take 1,000 mcg by mouth every evening.    [provider]                                                                                                                                     Past Surgical History Past Surgical History:  Procedure Laterality Date   cataracts     both eyes   COLONOSCOPY     colonoscopy with polypectomy  2004   CORONARY ARTERY BYPASS GRAFT  1995   X 7   FASCIECTOMY Left 11/29/2017   Procedure: SEGMENTAL FASCIECTOMY LEFT SMALL FINGER;  Surgeon: Daryll Brod, MD;  Location: [facility];  Service: Orthopedics;  Laterality: Left;  block and MAC   LEFT HEART CATH AND CORS/GRAFTS ANGIOGRAPHY N/A 07/25/2019   Procedure: LEFT HEART CATH AND CORS/GRAFTS ANGIOGRAPHY;  Surgeon: Burnell Blanks, MD;  Location: [facility];  Service: Cardiovascular;  Laterality: N/A;   POLYPECTOMY     Family History Family History  Problem Relation Age of Onset   Stroke Father 40   Diabetes Neg Hx    Cancer Neg Hx    GER disease Neg Hx    Heart attack Neg Hx    Colon cancer Neg Hx    Esophageal cancer Neg Hx    Stomach cancer Neg Hx    Heart disease Neg Hx     Social History Social History   Tobacco Use   Smoking status: Former    Packs/day: 4.00    Years: 30.00    Pack years: 120.00    Types: Cigarettes    Quit date: 10/18/1984    Years since quitting: 36.8   Smokeless tobacco: Former  Scientific laboratory technician Use: Never used  Substance Use Topics   Alcohol use: Yes    Alcohol/week: 12.0 standard drinks    Types: 7 Glasses of wine, 5 Cans of beer per week   Drug use: No   Allergies Sulfonamide derivatives  Review of Systems Review of Systems All other systems are reviewed and are negative for acute change except as noted in the HPI  Physical Exam Vital Signs  I have reviewed the triage vital signs BP 128/72   Pulse 93   Temp 98 F (36.7 C) (Oral)   Resp (!) 22   SpO2 98%   Physical Exam Vitals reviewed.  Constitutional:      General: He is not in acute distress.    Appearance: He is well-developed. He is not diaphoretic.  HENT:     Head: Normocephalic and atraumatic.     Nose: Nose normal.   Eyes:  General: No scleral icterus.       Right eye: No discharge.        Left eye: No discharge.     Conjunctiva/sclera: Conjunctivae normal.     Pupils: Pupils are equal, round, and reactive to light.  Cardiovascular:     Rate and Rhythm: Regular rhythm.     Heart sounds: No murmur heard.   No friction rub. No gallop.  Pulmonary:     Effort: Pulmonary effort is normal. Prolonged expiration present. No respiratory distress.     Breath sounds: No stridor. Wheezing (exp) present. No rales.  Abdominal:     General: There is no distension.     Palpations: Abdomen is soft.     Tenderness: There is no abdominal tenderness.  Musculoskeletal:        General: No tenderness.     Cervical back: Normal range of motion and neck supple.  Skin:    General: Skin is warm and dry.     Findings: No erythema or rash.  Neurological:     Mental Status: He is alert and oriented to person, place, and time.    ED Results and Treatments Labs (all labs ordered are listed, but only abnormal results are displayed) Labs Reviewed  CBC - Abnormal; Notable for the following components:      Result Value   WBC 11.4 (*)    All other components within normal limits  BASIC METABOLIC PANEL - Abnormal; Notable for the following components:   Glucose, Bld 124 (*)    All other components within normal limits                                                                                                                         EKG  EKG Interpretation  Date/Time:  Wednesday August 26 2021 05:31:10 EST Ventricular Rate:  85 PR Interval:  156 QRS Duration: 92 QT Interval:  359 QTC Calculation: 427 R Axis:   62 Text Interpretation: Sinus rhythm Borderline repolarization abnormality Artifact in lead(s) I II aVR aVL aVF Otherwise no significant change Confirmed by Addison Lank [phone]) on 08/26/2021 5:45:40 AM       Radiology DG Chest Port 1 View  Result Date: 08/26/2021 CLINICAL DATA:   Palpitations. EXAM: PORTABLE CHEST 1 VIEW COMPARISON:  Chest x-ray 12/18/2020 FINDINGS: The cardiac silhouette, mediastinal and hilar contours are within normal limits. Stable surgical changes from bypass surgery. The lungs are clear of an acute process. No infiltrates, edema or effusions. IMPRESSION: No acute cardiopulmonary findings. Electronically Signed   By: Marijo Sanes M.D.   On: 08/26/2021 05:55    Pertinent labs & imaging results that were available during my care of the patient were reviewed by me and considered in my medical decision making (see MDM for details).  Medications Ordered in ED Medications  levalbuterol (XOPENEX HFA) inhaler 2 puff (has no administration in time range)  Procedures .1-3 Lead EKG Interpretation Performed by: Fatima Blank, MD Authorized by: Fatima Blank, MD     Interpretation: normal     ECG rate:  96   ECG rate assessment: normal     Rhythm: sinus rhythm     Ectopy: none     Conduction: normal    (including critical care time)  Medical Decision Making / ED Course I have reviewed the nursing notes for this encounter and the patient's prior records (if available in EHR or on provided paperwork).  Fernando Walker was evaluated in Emergency Department on 08/26/2021 for the symptoms described in the history of present illness. He was evaluated in the context of the global COVID-19 pandemic, which necessitated consideration that the patient might be at risk for infection with the SARS-CoV-2 virus that causes COVID-19. Institutional protocols and algorithms that pertain to the evaluation of patients at risk for COVID-19 are in a state of rapid change based on information released by regulatory bodies including the CDC and federal and state organizations. These policies and algorithms were followed during the  patient's care in the ED.     AF RVR on EMS tracings. Improved with Dilt bolus. NSR currently. Labs reassuring w/o anemia or electrolyte derangements.  No complaints other than 3 weeks of dry cough. Exp wheezing throughout. CXR w/o PNA. Will try Xopenex.  Can be DC'd if he tolerates it.  Pertinent labs & imaging results that were available during my care of the patient were reviewed by me and considered in my medical decision making:  Patient care turned over to oncoming provider. Patient case and results discussed in detail; please see their note for further ED managment.     Final Clinical Impression(s) / ED Diagnoses Final diagnoses:  Cough  Atrial fibrillation with RVR ([place])     This chart was dictated using voice recognition software.  Despite best efforts to proofread,  errors can occur which can change the documentation meaning.    Fatima Blank, MD 08/26/21 [phone]

## 2021-09-02 NOTE — Progress Notes (Signed)
Subjective:    Patient ID: Fernando Walker, male    DOB: Sep 23, 1933, 87 y.o.   MRN: [id number]  This visit occurred during the SARS-CoV-2 public health emergency.  Safety protocols were in place, including screening questions prior to the visit, additional usage of staff PPE, and extensive cleaning of exam room while observing appropriate contact time as indicated for disinfecting solutions.     HPI The patient is here for follow up from the ED.  11/9 - ED for palpitations  He has a history of paroxysmal atrial fibrillation on Eliquis who went to the emergency room 11/9 for palpitations that began in the evening before.  He thinks he may have been experiencing some intermittent atrial fibrillation-he could experience some shortness of breath and fatigue and would rest if he was doing something physical and it would improve.  He knew he was in A. fib because his daughter had gotten him a fit bit and he saw his heart rate was in the 160s at times.  Palpitations did not resolve and got worse that night causing him to feel short of breath.  He called EMS and they took him to the ED.  He was in A. fib or flutter with RVR at a rate of 160s.  He received 18 mg of adenosine without improvement.  He had a bolus of diltiazem which after 5 minutes controlled his rate and he converted to normal sinus rhythm.  Heart rate after that was in the 90s.  He had no associated chest pain.  He was experiencing a dry cough for about 3 weeks.  He did have some expiratory wheeze on exam.  Albuterol at home has not helped and his throat would tighten up when he used it.    Chest x-ray was normal.  Labs were reassuring without anemia or electrolyte derangements.  He received Xopenex.  He was discharged home.   He has been using his Xopenex 2-3 times a day and it has been helping.  He has not tolerated albuterol or Spiriva.  His cough was initially productive and he was experiencing discolored mucus, but now it is  clear and his cough is improving.  His wheezing is also improving.  He denies any shortness of breath or fevers.  Medications and allergies reviewed with patient and updated if appropriate.  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   COPD (chronic obstructive pulmonary disease) ([place]) 05/01/2020   Aortic atherosclerosis ([place]) 03/27/2020   Cough 03/26/2020   Paroxysmal atrial fibrillation ([place]) 11/01/2019   Tachycardia-bradycardia syndrome ([place]) 11/01/2019   Dizziness 11/01/2019   Aortic stenosis 07/31/2019   Paroxysmal tachycardia, unspecified ([place]) 07/31/2019   Coronary artery disease of native artery of native heart with stable angina pectoris (HCC)    GERD (gastroesophageal reflux disease)    Dyslipidemia    Carotid arterial disease (HCC)    CAD (coronary artery disease) of artery bypass graft    Dupuytren's contracture of left hand 07/20/2017   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 180 tablet 1   latanoprost (XALATAN) 0.005 % ophthalmic solution Place 1 drop into both eyes at bedtime.     levalbuterol (XOPENEX HFA) 45 MCG/ACT inhaler Inhale into the lungs every 4 (four) hours as needed for wheezing. Patients takes 2 puffs in the morning and 2 puffs at night     simvastatin  (ZOCOR) 20 MG tablet TAKE 1 TABLET BY MOUTH AT  BEDTIME 90 tablet 3   triamcinolone ointment (KENALOG) 0.1 % Apply 1 application topically 2 (two) times daily as needed (rash).     vitamin B-12 (CYANOCOBALAMIN) 1000 MCG tablet Take 1,000 mcg by mouth every evening.     No current facility-administered medications on file prior to visit.    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   Hyperlipidemia    Hypertension    Hypertrophy of prostate with urinary obstruction and other lower urinary tract symptoms (LUTS)    Pneumonia    As a child   Pneumonia    as child    Past Surgical History:  Procedure Laterality Date   cataracts     both eyes   COLONOSCOPY     colonoscopy with polypectomy  2004   [place]   X 7   FASCIECTOMY Left 11/29/2017   Procedure: SEGMENTAL FASCIECTOMY LEFT SMALL FINGER;  Surgeon: Daryll Brod, MD;  Location: [facility];  Service: Orthopedics;  Laterality: Left;  block and MAC   LEFT HEART CATH AND CORS/GRAFTS ANGIOGRAPHY N/A 07/25/2019   Procedure: LEFT HEART CATH AND CORS/GRAFTS ANGIOGRAPHY;  Surgeon: Burnell Blanks, MD;  Location: [facility];  Service: Cardiovascular;  Laterality: N/A;   POLYPECTOMY      Social History   Socioeconomic History   Marital status: Widowed    Spouse name: Not on file   Number of children: 2   Years of education: 16   Highest education level: Bachelor's degree (e.g., BA, AB, BS)  Occupational History   Occupation: Retired  Tobacco Use   Smoking status: Former    Packs/day: 4.00    Years: 30.00    Pack years: 120.00    Types: Cigarettes    Quit date: 10/18/1984    Years since quitting: 36.9   Smokeless tobacco: Former  Scientific laboratory technician Use: Never used   Substance and Sexual Activity   Alcohol use: Yes    Alcohol/week: 12.0 standard drinks    Types: 7 Glasses of wine, 5 Cans of beer per week   Drug use: No   Sexual activity: Not Currently  Other Topics Concern   Not on file  Social History Narrative   Not on file   Social Determinants of Health   Financial Resource Strain: Not on file  Food Insecurity: Not on file  Transportation Needs: Not on file  Physical Activity: Not on file  Stress: Not on file  Social Connections: Not on file    Family History  Problem Relation Age of Onset  Stroke Father 21   Diabetes Neg Hx    Cancer Neg Hx    GER disease Neg Hx    Heart attack Neg Hx    Colon cancer Neg Hx    Esophageal cancer Neg Hx    Stomach cancer Neg Hx    Heart disease Neg Hx     Review of Systems  Constitutional:  Negative for chills and fever.  Respiratory:  Positive for cough (productive - was greenish -  now clear) and wheezing (intermittent). Negative for shortness of breath.   Cardiovascular:  Negative for chest pain, palpitations and leg swelling.  Neurological:  Negative for dizziness, light-headedness and headaches.      Objective:   Vitals:   09/03/21 1038  BP: 124/70  Pulse: (!) 58  Temp: 98 F (36.7 C)  SpO2: 97%   BP Readings from Last 3 Encounters:  09/03/21 124/70  08/26/21 139/71  07/31/21 124/76   Wt Readings from Last 3 Encounters:  09/03/21 204 lb (92.5 kg)  07/31/21 205 lb (93 kg)  02/27/21 209 lb 6.4 oz (95 kg)   Body mass index is 25.5 kg/m.   Physical Exam    Constitutional: Appears well-developed and well-nourished. No distress.  HENT:  Head: Normocephalic and atraumatic.  Neck: Neck supple. No tracheal deviation present. No thyromegaly present.  No cervical lymphadenopathy Cardiovascular: Normal rate, regular rhythm and normal heart sounds.   No murmur heard. No carotid bruit .  No edema Pulmonary/Chest: Effort normal and breath sounds normal. No respiratory distress.  Occ, mild wheeze. No rales.  Skin: Skin is warm and dry. Not diaphoretic.  Psychiatric: Normal mood and affect. Behavior is normal.      Assessment & Plan:    See Problem List for Assessment and Plan of chronic medical problems.

## 2021-09-03 ENCOUNTER — Other Ambulatory Visit: Payer: Self-pay

## 2021-09-03 ENCOUNTER — Encounter: Payer: Self-pay | Admitting: Internal Medicine

## 2021-09-03 ENCOUNTER — Ambulatory Visit (INDEPENDENT_AMBULATORY_CARE_PROVIDER_SITE_OTHER): Payer: Medicare Other | Admitting: Internal Medicine

## 2021-09-03 ENCOUNTER — Telehealth: Payer: Self-pay | Admitting: Internal Medicine

## 2021-09-03 VITALS — BP 124/70 | HR 58 | Temp 98.0°F | Ht 75.0 in | Wt 204.0 lb

## 2021-09-03 DIAGNOSIS — I2510 Atherosclerotic heart disease of native coronary artery without angina pectoris: Secondary | ICD-10-CM

## 2021-09-03 DIAGNOSIS — I480 Paroxysmal atrial fibrillation: Secondary | ICD-10-CM

## 2021-09-03 DIAGNOSIS — J449 Chronic obstructive pulmonary disease, unspecified: Secondary | ICD-10-CM

## 2021-09-03 DIAGNOSIS — N529 Male erectile dysfunction, unspecified: Secondary | ICD-10-CM | POA: Diagnosis not present

## 2021-09-03 MED ORDER — SILDENAFIL CITRATE 100 MG PO TABS: 50.0000 mg | ORAL_TABLET | Freq: Every day | ORAL | 11 refills | Status: DC | PRN

## 2021-09-03 NOTE — Patient Instructions (Addendum)
    Medications changes include :   [facility]il [facility]

## 2021-09-03 NOTE — Assessment & Plan Note (Signed)
New He is interested in trying medication-has never been on medication for this in the past Will try sildenafil 50-100 mg daily as needed Discussed possible side effects

## 2021-09-03 NOTE — Assessment & Plan Note (Signed)
Chronic Paroxysmal Currently blood pressure and heart rate well controlled so does not need additional medication He will monitor his heart rate with his Fitbit Continue Eliquis 5 mg twice daily Will follow-up with cardiology

## 2021-09-03 NOTE — Telephone Encounter (Signed)
Faxed in today. 

## 2021-09-03 NOTE — Telephone Encounter (Signed)
Patient calling in  Patient requesting new rx sildenafil (VIAGRA) 100 MG tablet provider [facility] be sent to different pharmacy  Please send to   [id number] - [place], [place] - [place]  Phone:  [phone] Fax:  [phone]

## 2021-09-03 NOTE — Assessment & Plan Note (Signed)
Chronic Recent flare that is improving without antibiotics or steroids Continue Xopenex inhaler-he will let me know when he needs a refill-we will use this as needed for now and expect that he will not need this on a regular basis-if he does discussed that we may need to try maintenance inhaler

## 2021-09-08 ENCOUNTER — Ambulatory Visit (INDEPENDENT_AMBULATORY_CARE_PROVIDER_SITE_OTHER): Payer: Medicare Other | Admitting: Physician Assistant

## 2021-09-08 ENCOUNTER — Encounter: Payer: Self-pay | Admitting: Physician Assistant

## 2021-09-08 ENCOUNTER — Other Ambulatory Visit: Payer: Self-pay

## 2021-09-08 VITALS — BP 160/58 | HR 71 | Ht 74.0 in | Wt 205.4 lb

## 2021-09-08 DIAGNOSIS — I10 Essential (primary) hypertension: Secondary | ICD-10-CM

## 2021-09-08 DIAGNOSIS — I779 Disorder of arteries and arterioles, unspecified: Secondary | ICD-10-CM | POA: Diagnosis not present

## 2021-09-08 DIAGNOSIS — E785 Hyperlipidemia, unspecified: Secondary | ICD-10-CM | POA: Diagnosis not present

## 2021-09-08 DIAGNOSIS — I480 Paroxysmal atrial fibrillation: Secondary | ICD-10-CM

## 2021-09-08 DIAGNOSIS — I350 Nonrheumatic aortic (valve) stenosis: Secondary | ICD-10-CM

## 2021-09-08 DIAGNOSIS — I2510 Atherosclerotic heart disease of native coronary artery without angina pectoris: Secondary | ICD-10-CM | POA: Diagnosis not present

## 2021-09-08 MED ORDER — BISOPROLOL FUMARATE 5 MG PO TABS: 2.5000 mg | ORAL_TABLET | Freq: Every day | ORAL | 3 refills | Status: DC

## 2021-09-08 NOTE — Patient Instructions (Addendum)
Medication Instructions:   START Bisoprolol one half  (1/2)  tablet by mouth ( 2.5 mg) daily.   *If you need a refill on your cardiac medications before your next appointment, please call your pharmacy*   Lab Work:  Your physician recommends that you return for a FASTING lipid profile/lft on Wednesday, May 3. You can come in on the day of  your appointment between 7:30-4:30 fasting from midnight the night before.    If you have labs (blood work) drawn today and your tests are completely normal, you will receive your results only by: [place] (if you have MyChart) OR A paper copy in the mail If you have any lab test that is abnormal or we need to change your treatment, we will call you to review the results.   Testing/Procedures:  -NONE    Follow-Up: At [facility], you and your health needs are our priority.  As part of our continuing mission to provide you with exceptional heart care, we have created designated Provider Care Teams.  These Care Teams include your primary Cardiologist (physician) and Advanced Practice Providers (APPs -  Physician Assistants and Nurse Practitioners) who all work together to provide you with the care you need, when you need it.  We recommend signing up for the patient portal called "MyChart".  Sign up information is provided on this After Visit Summary.  MyChart is used to connect with patients for Virtual Visits (Telemedicine).  Patients are able to view lab/test results, encounter notes, upcoming appointments, etc.  Non-urgent messages can be sent to your provider as well.   To learn more about what you can do with MyChart, go to [email or web address].    Your next appointment:   6 month(s)  The format for your next appointment:   In Person  Provider:   Lauree Chandler, MD     Other Instructions  Your physician wants you to follow-up in: 6 months with Dr. Angelena Form.  You will receive a reminder letter in the mail two months in  advance. If you don't receive a letter, please call our office to schedule the follow-up appointment.  Heart-Healthy Eating Plan Many factors influence your heart (coronary) health, including eating and exercise habits. Coronary risk increases with abnormal blood fat (lipid) levels. Heart-healthy meal planning includes limiting unhealthy fats, increasing healthy fats, and making other diet and lifestyle changes. What is my plan? Your health care provider may recommend that you: Limit your fat intake to _________% or less of your total calories each day. Limit your saturated fat intake to _________% or less of your total calories each day. Limit the amount of cholesterol in your diet to less than ____2000_____ mg per day. What are tips for following this plan? Cooking Cook foods using methods other than frying. Baking, boiling, grilling, and broiling are all good options. Other ways to reduce fat include: Removing the skin from poultry. Removing all visible fats from meats. Steaming vegetables in water or broth. Meal planning  At meals, imagine dividing your plate into fourths: Fill one-half of your plate with vegetables and green salads. Fill one-fourth of your plate with whole grains. Fill one-fourth of your plate with lean protein foods. Eat 4-5 servings of vegetables per day. One serving equals 1 cup raw or cooked vegetable, or 2 cups raw leafy greens. Eat 4-5 servings of fruit per day. One serving equals 1 medium whole fruit,  cup dried fruit,  cup fresh, frozen, or canned fruit, or  cup 100% fruit  juice. Eat more foods that contain soluble fiber. Examples include apples, broccoli, carrots, beans, peas, and barley. Aim to get 25-30 g of fiber per day. Increase your consumption of legumes, nuts, and seeds to 4-5 servings per week. One serving of dried beans or legumes equals  cup cooked, 1 serving of nuts is  cup, and 1 serving of seeds equals 1 tablespoon. Fats Choose healthy fats  more often. Choose monounsaturated and polyunsaturated fats, such as olive and canola oils, flaxseeds, walnuts, almonds, and seeds. Eat more omega-3 fats. Choose salmon, mackerel, sardines, tuna, flaxseed oil, and ground flaxseeds. Aim to eat fish at least 2 times each week. Check food labels carefully to identify foods with trans fats or high amounts of saturated fat. Limit saturated fats. These are found in animal products, such as meats, butter, and cream. Plant sources of saturated fats include palm oil, palm kernel oil, and coconut oil. Avoid foods with partially hydrogenated oils in them. These contain trans fats. Examples are stick margarine, some tub margarines, cookies, crackers, and other baked goods. Avoid fried foods. General information Eat more home-cooked food and less restaurant, buffet, and fast food. Limit or avoid alcohol. Limit foods that are high in starch and sugar. Lose weight if you are overweight. Losing just 5-10% of your body weight can help your overall health and prevent diseases such as diabetes and heart disease. Monitor your salt (sodium) intake, especially if you have high blood pressure. Talk with your health care provider about your sodium intake. Try to incorporate more vegetarian meals weekly. What foods can I eat? Fruits All fresh, canned (in natural juice), or frozen fruits. Vegetables Fresh or frozen vegetables (raw, steamed, roasted, or grilled). Green salads. Grains Most grains. Choose whole wheat and whole grains most of the time. Rice and pasta, including brown rice and pastas made with whole wheat. Meats and other proteins Lean, well-trimmed beef, veal, pork, and lamb. Chicken and [place] without skin. All fish and shellfish. Wild duck, rabbit, pheasant, and venison. Egg whites or low-cholesterol egg substitutes. Dried beans, peas, lentils, and tofu. Seeds and most nuts. Dairy Low-fat or nonfat cheeses, including ricotta and mozzarella. Skim or 1%  milk (liquid, powdered, or evaporated). Buttermilk made with low-fat milk. Nonfat or low-fat yogurt. Fats and oils Non-hydrogenated (trans-free) margarines. Vegetable oils, including soybean, sesame, sunflower, olive, peanut, safflower, corn, canola, and cottonseed. Salad dressings or mayonnaise made with a vegetable oil. Beverages Water (mineral or sparkling). Coffee and tea. Diet carbonated beverages. Sweets and desserts Sherbet, gelatin, and fruit ice. Small amounts of dark chocolate. Limit all sweets and desserts. Seasonings and condiments All seasonings and condiments. The items listed above may not be a complete list of foods and beverages you can eat. Contact a dietitian for more options. What foods are not recommended? Fruits Canned fruit in heavy syrup. Fruit in cream or butter sauce. Fried fruit. Limit coconut. Vegetables Vegetables cooked in cheese, cream, or butter sauce. Fried vegetables. Grains Breads made with saturated or trans fats, oils, or whole milk. Croissants. Sweet rolls. Donuts. High-fat crackers, such as cheese crackers. Meats and other proteins Fatty meats, such as hot dogs, ribs, sausage, bacon, rib-eye roast or steak. High-fat deli meats, such as salami and bologna. Caviar. Domestic duck and goose. Organ meats, such as liver. Dairy Cream, sour cream, cream cheese, and creamed cottage cheese. Whole-milk cheeses. Whole or 2% milk (liquid, evaporated, or condensed). Whole buttermilk. Cream sauce or high-fat cheese sauce. Whole-milk yogurt. Fats and oils Meat fat, or  shortening. Cocoa butter, hydrogenated oils, palm oil, coconut oil, palm kernel oil. Solid fats and shortenings, including bacon fat, salt pork, lard, and butter. Nondairy cream substitutes. Salad dressings with cheese or sour cream. Beverages Regular sodas and any drinks with added sugar. Sweets and desserts Frosting. Pudding. Cookies. Cakes. Pies. Milk chocolate or white chocolate. Buttered syrups.  Full-fat ice cream or ice cream drinks. The items listed above may not be a complete list of foods and beverages to avoid. Contact a dietitian for more information. Summary Heart-healthy meal planning includes limiting unhealthy fats, increasing healthy fats, and making other diet and lifestyle changes. Lose weight if you are overweight. Losing just 5-10% of your body weight can help your overall health and prevent diseases such as diabetes and heart disease. Focus on eating a balance of foods, including fruits and vegetables, low-fat or nonfat dairy, lean protein, nuts and legumes, whole grains, and heart-healthy oils and fats. This information is not intended to replace advice given to you by your health care provider. Make sure you discuss any questions you have with your health care provider. Document Revised: 02/12/2021 Document Reviewed: 02/12/2021 Elsevier Patient Education  [place].  Please take your blood pressure at home and write down. Bring to your appointment with Dr. Angelena Form.

## 2021-09-08 NOTE — Progress Notes (Signed)
Office Visit    Patient Name: DAILY CRATE Date of Encounter: 09/08/2021  PCP:  Binnie Rail, MD   [place]  Cardiologist:  Lauree Chandler, MD  Advanced Practice Provider:  No care team member to display Electrophysiologist:  None    {  Chief Complaint    NYCHOLAS RAYNER is a 87 y.o. male with a hx of CAD, atrial fibrillation, mild aortic stenosis, hypertension, previous stroke in March 2022, and hyperlipidemia presents today for  cardiac follow-up.   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   Hyperlipidemia    Hypertension    Hypertrophy of prostate with urinary obstruction and other lower urinary tract symptoms (LUTS)    Pneumonia    As a child   Pneumonia    as child   Past Surgical History:  Procedure Laterality Date   cataracts     both eyes   COLONOSCOPY     colonoscopy with polypectomy  2004   [place]   X 7   FASCIECTOMY Left 11/29/2017   Procedure: SEGMENTAL FASCIECTOMY LEFT SMALL FINGER;  Surgeon: Daryll Brod, MD;  Location: [facility];  Service: Orthopedics;  Laterality: Left;  block and MAC   LEFT HEART CATH AND CORS/GRAFTS ANGIOGRAPHY N/A 07/25/2019   Procedure: LEFT HEART CATH AND CORS/GRAFTS ANGIOGRAPHY;  Surgeon: Burnell Blanks, MD;  Location: [facility];  Service: Cardiovascular;  Laterality: N/A;   POLYPECTOMY      Allergies  Allergies  Allergen Reactions   Sulfonamide Derivatives Rash   Spiriva Respimat [Tiotropium Bromide Monohydrate] Other (See Comments)    pharyngitis    History of Present Illness    JAQUELL SEDDON is a 87 y.o. male with a hx of hx of CAD, atrial fibrillation, mild aortic stenosis,  hypertension, previous stroke in March 2022, and hyperlipidemia presents today for  cardiac follow-up last seen 01/15/2021 by Dr. Angelena Form.  He underwent 7V CABG in 1995. Carotid artery dopplers November 2017 with mild bilateral disease. Echo January 2019 with normal LV function, LVEF=65-70%,  mild AS (mean gradient 13 mmHg). He was seen in our office September 2020 by Ermalinda Barrios, PA and c/o chest tightness. Cardiac cath 10/720 with 6/7 patent bypass grafts. The distal limb of the SVG to the RV marginal was chronically occluded. Echo 07/20/19 with normal LVEF and mild AS. Cardiac monitor October 2020 and showed atrial fibrillation with longest episode 1 hour. Stroke beginning of March 2022. MRI with 5 mm caudate infarct. Echo 12/19/20 with LVEF=60-65%. Trivial MR. Mild to moderate AS. Carotid artery dopplers with mild bilateral carotid artery disease.   Patient was recently in the ED on 08/26/2021 for A. fib with RVR.  This resolved prehospital.  He remained in normal sinus rhythm did have some shortness of breath and coughing.  He was given a Xopenex inhaler and felt better.  He was not hypoxic.  He was then discharged home with outpatient f/u as needed.   Today he, has been doing pretty well.  He was recently in the ED due to a run of atrial fibrillation with RVR.  Per the patient his rate was in the 160s.  He  was asymptomatic at the time and only knew that his heart rate was fast due to his smart watch.  He converted to normal sinus rhythm by the time he got to the hospital.  He is tolerating his Eliquis without any bleeding.  His blood pressure was a little bit elevated in the clinic today.  It was 160/58.  He endorses that at home his systolic blood pressure is usually in the 140s.  He is compliant with his simvastatin however he expressed interest to switch it to a different statin sometime in the future when he sees Dr. Angelena Form.  He thinks that the simvastatin may be contributing to some fatigue.  His  EKG today was stable.  He was in normal sinus rhythm in the 70s.  He has had no chest pain or shortness of breath.  He has not experienced any swelling in his lower legs.  He continues to be active in the yard and gets around okay at home.  His daughter checks in on him from time to time.  Reports no shortness of breath nor dyspnea on exertion. Reports no chest pain, pressure, or tightness. No edema, orthopnea, PND. Reports no palpitations.       EKGs/Labs/Other Studies Reviewed:   The following studies were reviewed today:  Echocardiogram 12/19/2020  IMPRESSIONS     1. Left ventricular ejection fraction, by estimation, is 60 to 65%. The  left ventricle has normal function. The left ventricle has no regional  wall motion abnormalities. Left ventricular diastolic parameters are  consistent with Grade I diastolic  dysfunction (impaired relaxation).   2. Right ventricular systolic function is low normal. The right  ventricular size is normal. Tricuspid regurgitation signal is inadequate  for assessing PA pressure.   3. The mitral valve is abnormal. Trivial mitral valve regurgitation.   4. The aortic valve is calcified. There is moderate calcification of the  aortic valve. There is mild thickening of the aortic valve. Aortic valve  regurgitation is not visualized. Moderate aortic valve stenosis. Aortic  valve area, by VTI measures 1.34  cm. Aortic valve mean gradient measures 18.0 mmHg. Aortic valve Vmax  measures 2.87 m/s.   5. The inferior vena cava is normal in size with <50% respiratory  variability, suggesting right atrial pressure of 8 mmHg.   Comparison(s): Changes from prior study are noted. 07/20/2019: LVEF 60-65%,  mild LVH, mild aortic stenosis - mean gradient 13.4 mmHg.   Cardiac Cath 07/2019  Cardiac cath   1. Severe triple vessel CAD with chronic occlusion of the distal left main artery and the proximal RCA s/p 7V CABG with 6/7 patent bypass grafts 2. Patent LIMA to  LAD 3. Patent sequential SVG to Diagonal 1 and Diagonal 2.  4. Patent sequential SVG to obtuse marginal 1 and obtuse marginal 2 5. Patent SVG to RV marginal branch with chronic occlusion of the distal sequential limb of the graft to the PDA.    Diagnostic Dominance: Right Intervention     Recommendations: Continue medical management of CAD  EKG:  EKG is  ordered today.  The ekg ordered today demonstrates NSR in the 70s.   Recent Labs: 12/18/2020: ALT 20 08/26/2021: BUN 11; Creatinine, Ser 1.17; Hemoglobin 14.9; Platelets 340; Potassium 3.8; Sodium 138  Recent Lipid Panel    Component Value Date/Time   CHOL 148 12/18/2020 1839   CHOL 142 01/22/2019 0801   TRIG 60 12/18/2020 1839   HDL 68 12/18/2020 1839   HDL 64 01/22/2019 0801  CHOLHDL 2.2 12/18/2020 1839   VLDL 12 12/18/2020 1839   LDLCALC 68 12/18/2020 1839   LDLCALC 67 01/22/2019 0801    Risk Assessment/Calculations:   CHA2DS2-VASc Score = 6   This indicates a 9.7% annual risk of stroke. The patient's score is based upon: CHF History: 0 HTN History: 1 Diabetes History: 0 Stroke History: 2 Vascular Disease History: 1 Age Score: 2 Gender Score: 0    Home Medications   Current Meds  Medication Sig   apixaban (ELIQUIS) 5 MG TABS tablet Take 1 tablet (5 mg total) by mouth 2 (two) times daily.   bisoprolol (ZEBETA) 5 MG tablet Take 0.5 tablets (2.5 mg total) by mouth daily.   latanoprost (XALATAN) 0.005 % ophthalmic solution Place 1 drop into both eyes at bedtime.   levalbuterol (XOPENEX HFA) 45 MCG/ACT inhaler Inhale into the lungs every 4 (four) hours as needed for wheezing. Patients takes 2 puffs in the morning and 2 puffs at night   sildenafil (VIAGRA) 100 MG tablet Take 0.5-1 tablets (50-100 mg total) by mouth daily as needed for erectile dysfunction.   simvastatin (ZOCOR) 20 MG tablet TAKE 1 TABLET BY MOUTH AT  BEDTIME   triamcinolone ointment (KENALOG) 0.1 % Apply 1 application topically 2 (two) times daily as  needed (rash).   vitamin B-12 (CYANOCOBALAMIN) 1000 MCG tablet Take 1,000 mcg by mouth every evening.     Review of Systems      All other systems reviewed and are otherwise negative except as noted above.  Physical Exam    VS:  BP (!) 160/58   Pulse 71   Ht _0  (1.88 m)   Wt 205 lb 6.4 oz (93.2 kg)   SpO2 98%   BMI 26.37 kg/m  , BMI Body mass index is 26.37 kg/m.  Wt Readings from Last 3 Encounters:  09/08/21 205 lb 6.4 oz (93.2 kg)  09/03/21 204 lb (92.5 kg)  07/31/21 205 lb (93 kg)     GEN: Well nourished, well developed, in no acute distress. HEENT: normal. Neck: Supple, no JVD, carotid bruits, or masses. Cardiac: RRR, no murmurs, rubs, or gallops. No clubbing, cyanosis, edema.  Radials/PT 2+ and equal bilaterally.  Respiratory:  Respirations regular and unlabored, clear to auscultation bilaterally. GI: Soft, nontender, nondistended. MS: No deformity or atrophy. Skin: Warm and dry, no rash. Neuro:  Strength and sensation are intact. Psych: Normal affect.  Assessment & Plan    1. CAD s/p CABG (1995) without angina: No chest pain. Cardiac cath October 2020 with stable CAD. GDMT:  ASA and statin. Will start low-dose Bisoprolol.    2. HTN: BP is controlled. No changes in therapy. Continue low sodium diet. SBP normally runs 140s at home.  He is encouraged to continue to take his BP at home daily.     3. Hyperlipidemia: LDL at goal in March 2022. Will continue statin. Expressed interest in switching statins when he sees Dr. Angelena Form in the spring.    4. Carotid artery disease: He has mild bilateral carotid artery disease by dopplers in November 2017 and then again March 2022. No plans to repeat given age and stability of disease over time.    5. Aortic stenosis: Moderate AS by echo March 2022. Would plan to order echocardiogram at next appointment.    6. Atrial fibrillation, paroxysmal:  He is in sinus today. CHADS VASC score is 4. He was seen by EP in January 2021.  Plan to continue Eliquis.  Recently in the  ER with Afib with RVR, spontaneously resolved. We will start low-dose Bisoprolol 2.69m daily for better HR control. His BP can tolerate it.     Disposition: Follow up in 6 month(s) with CLauree Chandler MD or APP.  Signed, TElgie Collard PA-C 09/08/2021, 3:43 PM [facility]

## 2021-09-23 DIAGNOSIS — D485 Neoplasm of uncertain behavior of skin: Secondary | ICD-10-CM | POA: Diagnosis not present

## 2021-09-23 DIAGNOSIS — L820 Inflamed seborrheic keratosis: Secondary | ICD-10-CM | POA: Diagnosis not present

## 2021-10-16 ENCOUNTER — Telehealth: Payer: Medicare Other

## 2021-10-17 ENCOUNTER — Other Ambulatory Visit: Payer: Self-pay | Admitting: Cardiovascular Disease

## 2021-10-20 NOTE — Telephone Encounter (Signed)
Pt last saw Nicholes Rough, [place] on 09/08/21, last labs 08/26/21 Creat 1.17, age 88, weight 93.2kg, based on specified criteria pt is on appropriate dosage of Eliquis 5mg  BID for afib.  Will refill rx.

## 2021-10-23 ENCOUNTER — Other Ambulatory Visit: Payer: Self-pay

## 2021-10-23 ENCOUNTER — Emergency Department (HOSPITAL_COMMUNITY): Payer: Medicare Other

## 2021-10-23 ENCOUNTER — Emergency Department (HOSPITAL_COMMUNITY)
Admission: EM | Admit: 2021-10-23 | Discharge: 2021-10-23 | Disposition: A | Discharge status: Home / Self Care | Payer: Medicare Other | Attending: Emergency Medicine | Admitting: Emergency Medicine

## 2021-10-23 DIAGNOSIS — R112 Nausea with vomiting, unspecified: Secondary | ICD-10-CM | POA: Insufficient documentation

## 2021-10-23 DIAGNOSIS — Z5321 Procedure and treatment not carried out due to patient leaving prior to being seen by health care provider: Secondary | ICD-10-CM | POA: Diagnosis not present

## 2021-10-23 DIAGNOSIS — I672 Cerebral atherosclerosis: Secondary | ICD-10-CM | POA: Diagnosis not present

## 2021-10-23 DIAGNOSIS — R42 Dizziness and giddiness: Secondary | ICD-10-CM | POA: Insufficient documentation

## 2021-10-23 DIAGNOSIS — I499 Cardiac arrhythmia, unspecified: Secondary | ICD-10-CM | POA: Diagnosis not present

## 2021-10-23 DIAGNOSIS — Z743 Need for continuous supervision: Secondary | ICD-10-CM | POA: Diagnosis not present

## 2021-10-23 DIAGNOSIS — R29818 Other symptoms and signs involving the nervous system: Secondary | ICD-10-CM | POA: Diagnosis not present

## 2021-10-23 DIAGNOSIS — I10 Essential (primary) hypertension: Secondary | ICD-10-CM | POA: Diagnosis not present

## 2021-10-23 DIAGNOSIS — G459 Transient cerebral ischemic attack, unspecified: Secondary | ICD-10-CM | POA: Diagnosis not present

## 2021-10-23 LAB — CBC
HCT: 46.3 % (ref 39.0–52.0)
Hemoglobin: 14.4 g/dL (ref 13.0–17.0)
MCH: 29.6 pg (ref 26.0–34.0)
MCHC: 31.1 g/dL (ref 30.0–36.0)
MCV: 95.1 fL (ref 80.0–100.0)
Platelets: 263 10*3/uL (ref 150–400)
RBC: 4.87 MIL/uL (ref 4.22–5.81)
RDW: 13.6 % (ref 11.5–15.5)
WBC: 10.7 10*3/uL — ABNORMAL HIGH (ref 4.0–10.5)
nRBC: 0 % (ref 0.0–0.2)

## 2021-10-23 LAB — BASIC METABOLIC PANEL
Anion gap: 6 (ref 5–15)
BUN: 10 mg/dL (ref 8–23)
CO2: 29 mmol/L (ref 22–32)
Calcium: 8.8 mg/dL — ABNORMAL LOW (ref 8.9–10.3)
Chloride: 103 mmol/L (ref 98–111)
Creatinine, Ser: 1.1 mg/dL (ref 0.61–1.24)
GFR, Estimated: >60 mL/min (ref >60)
Glucose, Bld: 119 mg/dL — ABNORMAL HIGH (ref 70–99)
Potassium: 3.9 mmol/L (ref 3.5–5.1)
Sodium: 138 mmol/L (ref 135–145)

## 2021-10-23 MED ORDER — MECLIZINE HCL 25 MG PO TABS
12.5000 mg | ORAL_TABLET | Freq: Once | ORAL | Status: AC
  Administered 2021-10-23: 12.5 mg via ORAL
  Filled 2021-10-23: qty 1

## 2021-10-23 MED ORDER — ONDANSETRON 4 MG PO TBDP
4.0000 mg | ORAL_TABLET | Freq: Once | ORAL | Status: AC
  Administered 2021-10-23: 4 mg via ORAL
  Filled 2021-10-23: qty 1

## 2021-10-23 NOTE — ED Notes (Signed)
Meclizine order from pharmacy

## 2021-10-23 NOTE — ED Triage Notes (Signed)
18 g in left AC Given Zofran 4mg  IV at 740

## 2021-10-23 NOTE — ED Notes (Signed)
Patient called x3 with no answer, dragging OTF.

## 2021-10-23 NOTE — ED Triage Notes (Signed)
EMS stated, he has had dizziness with N/V and he had the same symptoms when he had a TIA in June 2022.

## 2021-10-23 NOTE — ED Provider Triage Note (Signed)
Emergency Medicine Provider Triage Evaluation Note  Fernando Walker , a 88 y.o. male  was evaluated in triage.  Pt complains of dizziness and nausea.   LKW: 11:30 pm woke up w vertigo "the bed was spinning" states he's had sim sx with strokes before. Hx of afib on DOAC.   Sx improving but still present  Review of Systems  Positive: Vertigo, nausea Negative: Fever   Physical Exam  BP (!) 142/72 (BP Location: Right Arm)    Pulse 65    Temp 98.5 F (36.9 C) (Oral)    Resp 16    SpO2 99%  Gen:   Awake, no distress   Resp:  Normal effort  MSK:   Moves extremities without difficulty  Other:    Alert and oriented to self, place, time and event.   Speech is fluent, clear without dysarthria or dysphasia.   Strength 5/5 in upper/lower extremities   Sensation intact in upper/lower extremities   Normal gait.  Normal finger-to-nose and feet tapping.  CN I not tested  CN II grossly intact visual fields bilaterally. Did not visualize posterior eye.  CN III, IV, VI PERRLA and EOMs intact bilaterally  CN V Intact sensation to sharp and light touch to the face  CN VII facial movements symmetric  CN VIII not tested  CN IX, X no uvula deviation, symmetric rise of soft palate  CN XI 5/5 SCM and trapezius strength bilaterally  CN XII Midline tongue protrusion, symmetric L/R movements    Medical Decision Making  Medically screening exam initiated at 8:50 AM.  Appropriate orders placed.  Fernando Walker was informed that the remainder of the evaluation will be completed by another provider, this initial triage assessment does not replace that evaluation, and the importance of remaining in the ED until their evaluation is complete.  DOAC use makes pt not a candidate for tPA. MRI ordered. Not a code stroke. Pt sx improving and neurologically without abn. Walks well.      Fernando Walker, [place] 10/23/21 [phone]

## 2021-10-27 ENCOUNTER — Encounter: Payer: Self-pay | Admitting: Internal Medicine

## 2021-10-27 ENCOUNTER — Other Ambulatory Visit: Payer: Self-pay

## 2021-10-27 MED ORDER — LEVALBUTEROL TARTRATE 45 MCG/ACT IN AERO: 2.0000 | INHALATION_SPRAY | RESPIRATORY_TRACT | 2 refills | Status: DC | PRN

## 2021-10-28 ENCOUNTER — Encounter: Payer: Self-pay | Admitting: Internal Medicine

## 2021-10-29 ENCOUNTER — Other Ambulatory Visit: Payer: Self-pay

## 2021-10-29 MED ORDER — LEVALBUTEROL TARTRATE 45 MCG/ACT IN AERO: INHALATION_SPRAY | RESPIRATORY_TRACT | 2 refills | Status: DC

## 2021-10-29 MED ORDER — LEVALBUTEROL TARTRATE 45 MCG/ACT IN AERO: 2.0000 | INHALATION_SPRAY | RESPIRATORY_TRACT | 2 refills | Status: DC | PRN

## 2021-11-01 ENCOUNTER — Encounter: Payer: Self-pay | Admitting: Internal Medicine

## 2021-11-01 NOTE — Patient Instructions (Addendum)
° ° °  Medications changes include :   breztri 2 puffs twice a day.     Use your other inhaler as needed only.     Rinse your mouth after using the new inhaler

## 2021-11-01 NOTE — Progress Notes (Addendum)
Subjective:    Patient ID: FRANKI STEMEN, male    DOB: 09/28/1933, 88 y.o.   MRN: [id number]  This visit occurred during the SARS-CoV-2 public health emergency.  Safety protocols were in place, including screening questions prior to the visit, additional usage of staff PPE, and extensive cleaning of exam room while observing appropriate contact time as indicated for disinfecting solutions.     HPI The patient is here for follow up from the ED.   ED 10/23/21 for dizziness.  He had dizziness and nausea.   He was not seen by a provider but did have several tests down.  EKG sinus arrhythmia at 64 bpm, nonspecific T wave abn.  This was similar to previous EKG 08/2021.  Bmp, cbc unremarkable.  CT head and MRI w/o acute abn - chronic small vessel dz, atrophy, h/o cerebellar infarct.  He received zofran and meclizine.  He eloped prior to being seen.   Last June he vertigo and this was similar to that episode.  No dizziness since being in the ED.    He is using his Xopenex twice daily-2 puffs each time.  He feels this does help.  He feels his shortness of breath may have gotten slightly worse.  He may have an occasional wheeze.   Medications and allergies reviewed with patient and updated if appropriate.  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   Dizziness 11/01/2019   Aortic stenosis 07/31/2019   Paroxysmal tachycardia, unspecified ([place]) 07/31/2019   Coronary artery disease of native artery of native heart with stable angina pectoris (HCC)     GERD (gastroesophageal reflux disease)    Dyslipidemia    Carotid arterial disease (HCC)    CAD (coronary artery disease) of artery bypass graft    Dupuytren's contracture of left hand 07/20/2017   Grover's disease 11/14/2014   Superficial and deep perivascular dermatitis 10/10/2014   Hx of adenomatous colonic polyps 07/27/2011   Carotid bruit 06/02/2011   FASTING HYPERGLYCEMIA 11/10/2009   CAD, ARTERY BYPASS GRAFT 04/29/2009   MYOCARDIAL PERFUSION SCAN, WITH STRESS TEST, ABNORMAL 04/29/2009   Hyperlipidemia 04/24/2009   HYPERTROPHY PROSTATE W/UR OBST & OTH LUTS 04/22/2009    Current Outpatient Medications on File Prior to Visit  Medication Sig Dispense Refill   apixaban (ELIQUIS) 5 MG TABS tablet TAKE 1 TABLET BY MOUTH  TWICE DAILY 180 tablet 2   bisoprolol (ZEBETA) 5 MG tablet Take 0.5 tablets (2.5 mg total) by mouth daily. 45 tablet 3   latanoprost (XALATAN) 0.005 % ophthalmic solution Place 1 drop into both eyes at bedtime.     levalbuterol (XOPENEX HFA) 45 MCG/ACT inhaler Take 2 puffs in the morning and 2 puffs at night as needed for wheezing 1 each 2   sildenafil (VIAGRA) 100 MG tablet Take 0.5-1 tablets (50-100 mg total) by mouth daily as needed for erectile dysfunction. 5 tablet 11   simvastatin (ZOCOR) 20 MG tablet TAKE 1 TABLET BY MOUTH AT  BEDTIME 90 tablet 3   triamcinolone ointment (KENALOG) 0.1 % Apply 1 application topically 2 (two) times daily  as needed (rash).     vitamin B-12 (CYANOCOBALAMIN) 1000 MCG tablet Take 1,000 mcg by mouth every evening.     No current facility-administered medications on file prior to visit.    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   Hyperlipidemia    Hypertension    Hypertrophy of prostate with urinary obstruction and other  lower urinary tract symptoms (LUTS)    Pneumonia    As a child   Pneumonia    as child    Past Surgical History:  Procedure Laterality Date   cataracts     both eyes   COLONOSCOPY     colonoscopy with polypectomy  2004   [place]   X 7   FASCIECTOMY Left 11/29/2017   Procedure: SEGMENTAL FASCIECTOMY LEFT SMALL FINGER;  Surgeon: Daryll Brod, MD;  Location: [facility];  Service: Orthopedics;  Laterality: Left;  block and MAC   LEFT HEART CATH AND CORS/GRAFTS ANGIOGRAPHY N/A 07/25/2019   Procedure: LEFT HEART CATH AND CORS/GRAFTS ANGIOGRAPHY;  Surgeon: Burnell Blanks, MD;  Location: [facility];  Service: Cardiovascular;  Laterality: N/A;   POLYPECTOMY      Social History   Socioeconomic History   Marital status: Widowed    Spouse name: Not on file   Number of children: 2   Years of education: 16   Highest education level: Bachelor's degree (e.g., BA, AB, BS)  Occupational History   Occupation: Retired  Tobacco Use   Smoking status: Former    Packs/day: 4.00    Years: 30.00    Pack years: 120.00    Types: Cigarettes    Quit date: 10/18/1984    Years since quitting: 37.0   Smokeless tobacco: Former  Scientific laboratory technician Use: Never used  Substance and Sexual Activity   Alcohol use: Yes    Alcohol/week: 12.0 standard drinks    Types: 7 Glasses of wine, 5 Cans of beer per week   Drug use: No   Sexual activity: Not Currently  Other Topics Concern   Not on file  Social History Narrative   Not on file   Social Determinants of Health   Financial Resource Strain: Not on file  Food Insecurity: Not on file  Transportation Needs: Not on file  Physical Activity: Not on file  Stress: Not on file  Social Connections: Not on file    Family History  Problem Relation Age of Onset   Stroke Father 6   Diabetes Neg Hx    Cancer Neg Hx    GER disease Neg Hx    Heart attack Neg Hx    Colon cancer Neg Hx    Esophageal  cancer Neg Hx    Stomach cancer Neg Hx    Heart disease Neg Hx     Review of Systems  Constitutional:  Negative for fever.  Eyes:  Negative for visual disturbance.  Respiratory:  Positive for shortness of breath (chronic - no change) and wheezing. Negative for cough.   Cardiovascular:  Negative for chest pain, palpitations and leg swelling.  Neurological:  Positive for dizziness. Negative for weakness, numbness and headaches.      Objective:   Vitals:   11/02/21 1309  BP: 130/78  Pulse: (!) 59  Temp: 98 F (36.7 C)  SpO2: 96%   BP Readings from Last 3 Encounters:  11/02/21 130/78  10/23/21 (!) 119/53  09/08/21 (!) 160/58   Wt Readings from Last 3 Encounters:  11/02/21 202 lb 9.6 oz (91.9 kg)  09/08/21 205 lb 6.4 oz (93.2 kg)  09/03/21 204 lb (92.5 kg)   Body mass index is 26.01 kg/m.   Physical Exam    Constitutional: Appears well-developed and well-nourished. No distress.  HENT:  Head: Normocephalic and atraumatic.  Neck: Neck supple. No tracheal deviation present. No thyromegaly present.  No cervical lymphadenopathy Cardiovascular: Normal rate, regular rhythm and normal heart sounds.   No murmur heard. No carotid bruit .  No edema Pulmonary/Chest: Effort normal and breath sounds normal. No respiratory distress.  End expiratory wheeze bilateral lower lungs-mild. No rales.  Skin: Skin is warm and dry. Not diaphoretic.  Psychiatric: Normal mood and affect. Behavior is normal.      Assessment & Plan:    See Problem List for Assessment and Plan of chronic medical problems.

## 2021-11-02 ENCOUNTER — Other Ambulatory Visit: Payer: Self-pay

## 2021-11-02 ENCOUNTER — Ambulatory Visit (INDEPENDENT_AMBULATORY_CARE_PROVIDER_SITE_OTHER): Payer: Medicare Other | Admitting: Internal Medicine

## 2021-11-02 VITALS — BP 130/78 | HR 59 | Temp 98.0°F | Ht 74.0 in | Wt 202.6 lb

## 2021-11-02 DIAGNOSIS — R42 Dizziness and giddiness: Secondary | ICD-10-CM

## 2021-11-02 DIAGNOSIS — J449 Chronic obstructive pulmonary disease, unspecified: Secondary | ICD-10-CM | POA: Diagnosis not present

## 2021-11-02 DIAGNOSIS — I480 Paroxysmal atrial fibrillation: Secondary | ICD-10-CM | POA: Diagnosis not present

## 2021-11-02 MED ORDER — BREZTRI AEROSPHERE 160-9-4.8 MCG/ACT IN AERO: 2.0000 | INHALATION_SPRAY | Freq: Two times a day (BID) | RESPIRATORY_TRACT | 11 refills | Status: DC

## 2021-11-02 NOTE — Assessment & Plan Note (Signed)
Chronic Intermittent-infrequent Recent episode of dizziness was classic for vertigo he had in the past and has resolved-no vertigo since then Work-up in the emergency room was reassuring-no acute or concerning findings Discussed meclizine as needed-he deferred at this time since his episodes are so infrequent

## 2021-11-02 NOTE — Assessment & Plan Note (Signed)
Chronic-paroxysmal Not taking bisoprolol Blood pressure has been well controlled at home and he does wear a Fitbit and monitors his heart rate-has not been elevated Will discuss bisoprolol with cardiology at his follow-up

## 2021-11-02 NOTE — Assessment & Plan Note (Addendum)
Chronic Not ideally controlled Experiencing shortness of breath and occasional wheeze, which is not new Currently using Xopenex 2 puffs twice daily and that definitely helps, but he still has some shortness of breath and wheezing on occasion Would benefit from maintenance inhaler Trial of Breztri 2 puffs twice daily Rinse mouth after use of above inhaler Can continue Xopenex 2 puffs twice daily as needed

## 2021-12-27 ENCOUNTER — Other Ambulatory Visit: Payer: Self-pay | Admitting: Cardiovascular Disease

## 2021-12-30 ENCOUNTER — Other Ambulatory Visit: Payer: Self-pay | Admitting: Internal Medicine

## 2021-12-31 DIAGNOSIS — Z961 Presence of intraocular lens: Secondary | ICD-10-CM | POA: Diagnosis not present

## 2021-12-31 DIAGNOSIS — H401131 Primary open-angle glaucoma, bilateral, mild stage: Secondary | ICD-10-CM | POA: Diagnosis not present

## 2022-02-10 ENCOUNTER — Telehealth: Payer: Self-pay

## 2022-02-10 NOTE — Telephone Encounter (Signed)
Pt is calling with concerns taking: ?Budeson-Glycopyrrol-Formoterol (BREZTRI AEROSPHERE) 160-9-4.8 MCG/ACT AERO ? ?About the side effects about not rinsing the mouth like thrush, Fungal infection.  ? ?Pt states he would like to keep taking levalbuterol (XOPENEX HFA) 45 MCG/ACT inhaler and not try the sample of Budeson-Glycopyrrol-Formoterol (BREZTRI AEROSPHERE) 160-9-4.8 MCG/ACT AERO. ? ?Please advise. ?

## 2022-02-10 NOTE — Telephone Encounter (Signed)
That is okay he can do that.  And may not get him as good of control because the Xopenex is shorter acting. ?

## 2022-02-10 NOTE — Telephone Encounter (Signed)
Spoke with patient today and info given. 

## 2022-02-17 ENCOUNTER — Other Ambulatory Visit: Payer: Medicare Other

## 2022-03-03 ENCOUNTER — Other Ambulatory Visit: Payer: Medicare Other

## 2022-03-03 DIAGNOSIS — I2510 Atherosclerotic heart disease of native coronary artery without angina pectoris: Secondary | ICD-10-CM

## 2022-03-03 DIAGNOSIS — I10 Essential (primary) hypertension: Secondary | ICD-10-CM | POA: Diagnosis not present

## 2022-03-03 DIAGNOSIS — I480 Paroxysmal atrial fibrillation: Secondary | ICD-10-CM | POA: Diagnosis not present

## 2022-03-03 DIAGNOSIS — E785 Hyperlipidemia, unspecified: Secondary | ICD-10-CM | POA: Diagnosis not present

## 2022-03-03 DIAGNOSIS — I350 Nonrheumatic aortic (valve) stenosis: Secondary | ICD-10-CM | POA: Diagnosis not present

## 2022-03-03 LAB — LIPID PANEL
Chol/HDL Ratio: 1.9 ratio (ref 0.0–5.0)
Cholesterol, Total: 135 mg/dL (ref 100–199)
HDL: 71 mg/dL (ref >39)
LDL Chol Calc (NIH): 53 mg/dL (ref 0–99)
Triglycerides: 51 mg/dL (ref 0–149)
VLDL Cholesterol Cal: 11 mg/dL (ref 5–40)

## 2022-03-03 LAB — HEPATIC FUNCTION PANEL
ALT: 17 IU/L (ref 0–44)
AST: 21 IU/L (ref 0–40)
Albumin: 4.2 g/dL (ref 3.6–4.6)
Alkaline Phosphatase: 46 IU/L (ref 44–121)
Bilirubin Total: 1 mg/dL (ref 0.0–1.2)
Bilirubin, Direct: 0.28 mg/dL (ref 0.00–0.40)
Total Protein: 7 g/dL (ref 6.0–8.5)

## 2022-03-08 ENCOUNTER — Ambulatory Visit (INDEPENDENT_AMBULATORY_CARE_PROVIDER_SITE_OTHER): Payer: Medicare Other | Admitting: Cardiovascular Disease

## 2022-03-08 ENCOUNTER — Encounter: Payer: Self-pay | Admitting: Cardiovascular Disease

## 2022-03-08 VITALS — BP 100/60 | HR 69 | Ht 74.0 in | Wt 198.6 lb

## 2022-03-08 DIAGNOSIS — I10 Essential (primary) hypertension: Secondary | ICD-10-CM | POA: Diagnosis not present

## 2022-03-08 DIAGNOSIS — I2510 Atherosclerotic heart disease of native coronary artery without angina pectoris: Secondary | ICD-10-CM | POA: Diagnosis not present

## 2022-03-08 DIAGNOSIS — E785 Hyperlipidemia, unspecified: Secondary | ICD-10-CM

## 2022-03-08 DIAGNOSIS — I480 Paroxysmal atrial fibrillation: Secondary | ICD-10-CM | POA: Diagnosis not present

## 2022-03-08 DIAGNOSIS — I350 Nonrheumatic aortic (valve) stenosis: Secondary | ICD-10-CM | POA: Diagnosis not present

## 2022-03-08 NOTE — Patient Instructions (Signed)
Medication Instructions:  No changes *If you need a refill on your cardiac medications before your next appointment, please call your pharmacy*   Lab Work: none If you have labs (blood work) drawn today and your tests are completely normal, you will receive your results only by: MyChart Message (if you have MyChart) OR A paper copy in the mail If you have any lab test that is abnormal or we need to change your treatment, we will call you to review the results.   Testing/Procedures: Your physician has requested that you have an echocardiogram. Echocardiography is a painless test that uses sound waves to create images of your heart. It provides your doctor with information about the size and shape of your heart and how well your heart's chambers and valves are working. This procedure takes approximately one hour. There are no restrictions for this procedure.    Follow-Up: At [facility], you and your health needs are our priority.  As part of our continuing mission to provide you with exceptional heart care, we have created designated Provider Care Teams.  These Care Teams include your primary Cardiologist (physician) and Advanced Practice Providers (APPs -  Physician Assistants and Nurse Practitioners) who all work together to provide you with the care you need, when you need it.   Your next appointment:   12 month(s)  The format for your next appointment:   In Person  Provider:   Christopher McAlhany, MD     Other Instructions   Important Information About Sugar       

## 2022-03-08 NOTE — Progress Notes (Signed)
Chief Complaint  Patient presents with   Follow-up    CAD   History of Present Illness: 88yo male with history of CAD, atrial fibrillation, mild aortic stenosis, HTN, stroke March 2022 and HLD here today for cardiac follow up. He underwent 7V CABG in 1995. Carotid artery dopplers November 2017 with mild bilateral disease. Echo January 2019 with normal LV function, LVEF=65-70%,  mild AS (mean gradient 13 mmHg). He was seen in our office September 2020 by Ermalinda Barrios, PA and c/o chest tightness. Cardiac cath 10/720 with 6/7 patent bypass grafts. The distal limb of the SVG to the RV marginal was chronically occluded. Echo 07/20/19 with normal LVEF and mild AS. Cardiac monitor October 2020 and showed atrial fibrillation with longest episode 1 hour. Stroke beginning of March 2022. MRI with 5 mm caudate infarct. Echo 12/19/20 with LVEF=60-65%. Trivial MR. Mild to moderate AS. Carotid artery dopplers March 2022 with mild bilateral carotid artery disease. He was seen in the ED 08/26/21 with atrial fib with RVR and had spontaneous conversion to sinus rhythm.   He is here today for follow up. The patient denies any chest pain, dyspnea, palpitations, lower extremity edema, orthopnea, PND, dizziness, near syncope or syncope.    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   Hyperlipidemia    Hypertension    Hypertrophy of prostate with urinary obstruction and other lower urinary tract symptoms (LUTS)    Pneumonia    As a child   Pneumonia    as child    Past Surgical History:  Procedure Laterality Date   cataracts     both eyes   COLONOSCOPY     colonoscopy with polypectomy  2004   [place]   X 7   FASCIECTOMY Left 11/29/2017    Procedure: SEGMENTAL FASCIECTOMY LEFT SMALL FINGER;  Surgeon: Daryll Brod, MD;  Location: [place];  Service: Orthopedics;  Laterality: Left;  block and MAC   LEFT HEART CATH AND CORS/GRAFTS ANGIOGRAPHY N/A 07/25/2019   Procedure: LEFT HEART CATH AND CORS/GRAFTS ANGIOGRAPHY;  Surgeon: Burnell Blanks, MD;  Location: [facility];  Service: Cardiovascular;  Laterality: N/A;   POLYPECTOMY      Current Outpatient Medications  Medication Sig Dispense Refill   apixaban (ELIQUIS) 5 MG TABS tablet TAKE 1 TABLET BY MOUTH  TWICE DAILY 180 tablet 2   latanoprost (XALATAN) 0.005 % ophthalmic solution Place 1 drop into both eyes at bedtime.     levalbuterol (XOPENEX HFA) 45 MCG/ACT inhaler USE 2 INHALATIONS BY MOUTH IN  THE MORNING AND 2 INHALATIONS BY MOUTH AT NIGHT AS NEEDED FOR  WHEEZING 30 g 3   simvastatin (ZOCOR) 20 MG tablet TAKE 1 TABLET BY MOUTH AT  BEDTIME 90 tablet 2   triamcinolone ointment (KENALOG) 0.1 % Apply 1 application topically 2 (two) times daily as needed (rash).     vitamin B-12 (CYANOCOBALAMIN) 1000 MCG tablet Take 1,000 mcg by mouth every evening.     sildenafil (VIAGRA) 100 MG tablet Take 0.5-1 tablets (50-100 mg total) by mouth daily as needed for erectile dysfunction. (Patient not taking: Reported on 03/08/2022) 5 tablet 11   No current facility-administered medications for  this visit.    Allergies  Allergen Reactions   Sulfonamide Derivatives Rash   Spiriva Respimat [Tiotropium Bromide Monohydrate] Other (See Comments)    pharyngitis    Social History   Socioeconomic History   Marital status: Widowed    Spouse name: Not on file   Number of children: 2   Years of education: 16   Highest education level: Bachelor's degree (e.g., BA, AB, BS)  Occupational History   Occupation: Retired  Tobacco Use   Smoking status: Former    Packs/day: 4.00    Years: 30.00    Pack years: 120.00    Types: Cigarettes    Quit date: 10/18/1984     Years since quitting: 37.4   Smokeless tobacco: Former  Scientific laboratory technician Use: Never used  Substance and Sexual Activity   Alcohol use: Yes    Alcohol/week: 12.0 standard drinks    Types: 7 Glasses of wine, 5 Cans of beer per week   Drug use: No   Sexual activity: Not Currently  Other Topics Concern   Not on file  Social History Narrative   Not on file   Social Determinants of Health   Financial Resource Strain: Not on file  Food Insecurity: Not on file  Transportation Needs: Not on file  Physical Activity: Not on file  Stress: Not on file  Social Connections: Not on file  Intimate Partner Violence: Not on file    Family History  Problem Relation Age of Onset   Stroke Father 50   Diabetes Neg Hx    Cancer Neg Hx    GER disease Neg Hx    Heart attack Neg Hx    Colon cancer Neg Hx    Esophageal cancer Neg Hx    Stomach cancer Neg Hx    Heart disease Neg Hx     Review of Systems:  As stated in the HPI and otherwise negative.   BP 100/60   Pulse 69   Ht _0  (1.88 m)   Wt 198 lb 9.6 oz (90.1 kg)   SpO2 98%   BMI 25.50 kg/m   Physical Examination: General: Well developed, well nourished, NAD  HEENT: OP clear, mucus membranes moist  SKIN: warm, dry. No rashes. Neuro: No focal deficits  Musculoskeletal: Muscle strength 5/5 all ext  Psychiatric: Mood and affect normal  Neck: No JVD, no carotid bruits, no thyromegaly, no lymphadenopathy.  Lungs:Clear bilaterally, no wheezes, rhonci, crackles Cardiovascular: Regular rate and rhythm. No murmurs, gallops or rubs. Abdomen:Soft. Bowel sounds present. Non-tender.  Extremities: No lower extremity edema. Pulses are 2 + in the bilateral DP/PT.  Echo March 2022:  1. Left ventricular ejection fraction, by estimation, is 60 to 65%. The  left ventricle has normal function. The left ventricle has no regional  wall motion abnormalities. Left ventricular diastolic parameters are  consistent with Grade I diastolic   dysfunction (impaired relaxation).   2. Right ventricular systolic function is low normal. The right  ventricular size is normal. Tricuspid regurgitation signal is inadequate  for assessing PA pressure.   3. The mitral valve is abnormal. Trivial mitral valve regurgitation.   4. The aortic valve is calcified. There is moderate calcification of the  aortic valve. There is mild thickening of the aortic valve. Aortic valve  regurgitation is not visualized. Moderate aortic valve stenosis. Aortic  valve area, by VTI measures 1.34  cm. Aortic valve mean gradient measures 18.0 mmHg. Aortic valve Vmax  measures 2.87 m/s.  5. The inferior vena cava is normal in size with <50% respiratory  variability, suggesting right atrial pressure of 8 mmHg.   Cardiac cath   1. Severe triple vessel CAD with chronic occlusion of the distal left main artery and the proximal RCA s/p 7V CABG with 6/7 patent bypass grafts 2. Patent LIMA to LAD 3. Patent sequential SVG to Diagonal 1 and Diagonal 2.  4. Patent sequential SVG to obtuse marginal 1 and obtuse marginal 2 5. Patent SVG to RV marginal branch with chronic occlusion of the distal sequential limb of the graft to the PDA.   Diagnostic Dominance: Right  Intervention    EKG:  EKG is not ordered today. The ekg ordered today demonstrates   Recent Labs: 10/23/2021: BUN 10; Creatinine, Ser 1.10; Hemoglobin 14.4; Platelets 263; Potassium 3.9; Sodium 138 03/03/2022: ALT 17   Lipid Panel    Component Value Date/Time   CHOL 135 03/03/2022 0927   TRIG 51 03/03/2022 0927   HDL 71 03/03/2022 0927   CHOLHDL 1.9 03/03/2022 0927   CHOLHDL 2.2 12/18/2020 1839   VLDL 12 12/18/2020 1839   LDLCALC 53 03/03/2022 0927     Wt Readings from Last 3 Encounters:  03/08/22 198 lb 9.6 oz (90.1 kg)  11/02/21 202 lb 9.6 oz (91.9 kg)  09/08/21 205 lb 6.4 oz (93.2 kg)     Assessment and Plan:   1. CAD s/p CABG without angina: He has no chest pain. Cardiac cath  October 2020 with stable CAD. Will continue ASA, beta blocker. See below regarding his statin.   2. HTN: BP is well controlled. No changes today. He did not start bisoprolol.   3. Hyperlipidemia: LDL at goal in May 2023. He would like to hold his statin for a few months to see if his energy improves.   4. Carotid artery disease: He has mild bilateral carotid artery disease by dopplers in November 2017 and then again March 2022. No plans to repeat given age and stability of disease over time.   5. Aortic stenosis: Moderate AS by echo March 2022. Repeat echo now  6. Atrial fibrillation, paroxysmal:  He is in sinus today. CHADS VASC score is 4. He was seen by EP in January 2021. Plan to continue Eliquis.   Current medicines are reviewed at length with the patient today.  The patient does not have concerns regarding medicines.  The following changes have been made:  no change  Labs/ tests ordered today include:   Orders Placed This Encounter  Procedures   ECHOCARDIOGRAM COMPLETE    Disposition: Follow up with me in 12 months.   Signed, Lauree Chandler, MD 03/08/2022 4:20 PM    [facility] [address], [place], [place]  [address] Phone: [phone]; Fax: [phone]

## 2022-03-30 ENCOUNTER — Ambulatory Visit (HOSPITAL_COMMUNITY): Payer: Medicare Other | Attending: Cardiology

## 2022-03-30 DIAGNOSIS — I2510 Atherosclerotic heart disease of native coronary artery without angina pectoris: Secondary | ICD-10-CM

## 2022-03-30 DIAGNOSIS — I350 Nonrheumatic aortic (valve) stenosis: Secondary | ICD-10-CM

## 2022-03-31 LAB — ECHOCARDIOGRAM COMPLETE
AR max vel: 1.67 cm2
AV Area VTI: 1.61 cm2
AV Area mean vel: 1.63 cm2
AV Mean grad: 18.8 mmHg
AV Peak grad: 33.4 mmHg
Ao pk vel: 2.89 m/s
Area-P 1/2: 2.73 cm2
S' Lateral: 3 cm

## 2022-04-13 ENCOUNTER — Encounter: Payer: Self-pay | Admitting: Internal Medicine

## 2022-04-13 DIAGNOSIS — R209 Unspecified disturbances of skin sensation: Secondary | ICD-10-CM

## 2022-04-23 ENCOUNTER — Encounter: Payer: Self-pay | Admitting: Cardiovascular Disease

## 2022-04-23 DIAGNOSIS — E785 Hyperlipidemia, unspecified: Secondary | ICD-10-CM

## 2022-05-06 DIAGNOSIS — H401131 Primary open-angle glaucoma, bilateral, mild stage: Secondary | ICD-10-CM | POA: Diagnosis not present

## 2022-05-13 ENCOUNTER — Other Ambulatory Visit: Payer: Medicare Other

## 2022-05-13 DIAGNOSIS — E785 Hyperlipidemia, unspecified: Secondary | ICD-10-CM | POA: Diagnosis not present

## 2022-05-13 LAB — LIPID PANEL
Chol/HDL Ratio: 2.7 ratio (ref 0.0–5.0)
Cholesterol, Total: 192 mg/dL (ref 100–199)
HDL: 72 mg/dL (ref >39)
LDL Chol Calc (NIH): 108 mg/dL — ABNORMAL HIGH (ref 0–99)
Triglycerides: 65 mg/dL (ref 0–149)
VLDL Cholesterol Cal: 12 mg/dL (ref 5–40)

## 2022-05-14 ENCOUNTER — Encounter: Payer: Self-pay | Admitting: Cardiovascular Disease

## 2022-05-14 DIAGNOSIS — E785 Hyperlipidemia, unspecified: Secondary | ICD-10-CM

## 2022-05-14 MED ORDER — ROSUVASTATIN CALCIUM 5 MG PO TABS: 5.0000 mg | ORAL_TABLET | Freq: Every day | ORAL | 3 refills | Status: DC

## 2022-05-21 ENCOUNTER — Ambulatory Visit (INDEPENDENT_AMBULATORY_CARE_PROVIDER_SITE_OTHER): Payer: Medicare Other | Admitting: Diagnostic Neuroimaging

## 2022-05-21 ENCOUNTER — Encounter: Payer: Self-pay | Admitting: Diagnostic Neuroimaging

## 2022-05-21 VITALS — BP 167/78 | HR 59 | Ht 74.0 in | Wt 201.0 lb

## 2022-05-21 DIAGNOSIS — R200 Anesthesia of skin: Secondary | ICD-10-CM

## 2022-05-21 DIAGNOSIS — I2510 Atherosclerotic heart disease of native coronary artery without angina pectoris: Secondary | ICD-10-CM

## 2022-05-21 NOTE — Patient Instructions (Addendum)
-   check B12 and lower ext ultrasound

## 2022-05-21 NOTE — Progress Notes (Signed)
[facility]  PATIENT: Fernando Walker DOB: May 30, 1933  REFERRING CLINICIAN: Binnie Rail, MD HISTORY FROM: patient  REASON FOR VISIT: new consult   HISTORICAL  CHIEF COMPLAINT:  Chief Complaint  Patient presents with   New Patient (Initial Visit)    Rm 7, alone. Pt referred for altered sensation of foot. Pt reports feeling like he's walking on marshmallows. Pt has had 7 heart bypass and veins from both legs were used. Having more leg pn then heart pn.     HISTORY OF PRESENT ILLNESS:   88 year old male here for evaluation of abnormal sensation in feet.  Symptoms of been present for at least 1 to 2 years.  No major issues with pain, gait difficulty.  No problems with fingers or hands.  Also had a small stroke in March 2022, with mild left-sided weakness.  Has made good recovery.  Tolerating medications.   REVIEW OF SYSTEMS: Full 14 system review of systems performed and negative with exception of:   ALLERGIES: Allergies  Allergen Reactions   Sulfonamide Derivatives Rash   Spiriva Respimat [Tiotropium Bromide Monohydrate] Other (See Comments)    pharyngitis    HOME MEDICATIONS: Outpatient Medications Prior to Visit  Medication Sig Dispense Refill   apixaban (ELIQUIS) 5 MG TABS tablet TAKE 1 TABLET BY MOUTH  TWICE DAILY 180 tablet 2   latanoprost (XALATAN) 0.005 % ophthalmic solution Place 1 drop into both eyes at bedtime.     levalbuterol (XOPENEX HFA) 45 MCG/ACT inhaler USE 2 INHALATIONS BY MOUTH IN  THE MORNING AND 2 INHALATIONS BY MOUTH AT NIGHT AS NEEDED FOR  WHEEZING 30 g 3   rosuvastatin (CRESTOR) 5 MG tablet Take 1 tablet (5 mg total) by mouth daily. 90 tablet 3   sildenafil (VIAGRA) 100 MG tablet Take 0.5-1 tablets (50-100 mg total) by mouth daily as needed for erectile dysfunction. 5 tablet 11   triamcinolone ointment (KENALOG) 0.1 % Apply 1 application topically 2 (two) times daily as needed (rash).     vitamin B-12 (CYANOCOBALAMIN) 1000  MCG tablet Take 1,000 mcg by mouth every evening.     No facility-administered medications prior to visit.    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   Hyperlipidemia    Hypertension    Hypertrophy of prostate with urinary obstruction and other lower urinary tract symptoms (LUTS)    Pneumonia    As a child   Pneumonia    as child    PAST SURGICAL HISTORY: Past Surgical History:  Procedure Laterality Date   cataracts     both eyes   COLONOSCOPY     colonoscopy with polypectomy  2004   [place]   X 7   FASCIECTOMY Left 11/29/2017   Procedure: SEGMENTAL FASCIECTOMY LEFT SMALL FINGER;  Surgeon: Daryll Brod, MD;  Location: [facility];  Service: Orthopedics;  Laterality: Left;  block and MAC   LEFT HEART CATH AND CORS/GRAFTS ANGIOGRAPHY N/A 07/25/2019   Procedure: LEFT HEART CATH AND CORS/GRAFTS ANGIOGRAPHY;  Surgeon: Burnell Blanks, MD;  Location: [place] [facility];  Service: Cardiovascular;  Laterality: N/A;   POLYPECTOMY      FAMILY HISTORY: Family History  Problem Relation Age of Onset  Stroke Father 33   Diabetes Neg Hx    Cancer Neg Hx    GER disease Neg Hx    Heart attack Neg Hx    Colon cancer Neg Hx    Esophageal cancer Neg Hx    Stomach cancer Neg Hx    Heart disease Neg Hx     SOCIAL HISTORY: Social History   Socioeconomic History   Marital status: Widowed    Spouse name: Not on file   Number of children: 2   Years of education: 16   Highest education level: Bachelor's degree (e.g., BA, AB, BS)  Occupational History   Occupation: Retired  Tobacco Use   Smoking status: Former    Packs/day: 4.00    Years: 30.00    Total pack years: 120.00    Types: Cigarettes    Quit  date: 10/18/1984    Years since quitting: 37.6   Smokeless tobacco: Former  Scientific laboratory technician Use: Never used  Substance and Sexual Activity   Alcohol use: Yes    Alcohol/week: 12.0 standard drinks of alcohol    Types: 7 Glasses of wine, 5 Cans of beer per week   Drug use: No   Sexual activity: Not Currently  Other Topics Concern   Not on file  Social History Narrative   Lives alone   R handed   Caffeine: 2 C of coffee AM   Social Determinants of Health   Financial Resource Strain: Medium Risk (03/18/2020)   Overall Financial Resource Strain (CARDIA)    Difficulty of Paying Living Expenses: Somewhat hard  Food Insecurity: No Food Insecurity (03/10/2020)   Hunger Vital Sign    Worried About Running Out of Food in the Last Year: Never true    Ran Out of Food in the Last Year: Never true  Transportation Needs: No Transportation Needs (03/10/2020)   PRAPARE - Hydrologist (Medical): No    Lack of Transportation (Non-Medical): No  Physical Activity: Unknown (03/06/2019)   Exercise Vital Sign    Days of Exercise per Week: 4 days    Minutes of Exercise per Session: Not on file  Stress: No Stress Concern Present (03/10/2020)   [place]    Feeling of Stress : Not at all  Social Connections: Moderately Integrated (03/10/2020)   Social Connection and Isolation Panel [NHANES]    Frequency of Communication with Friends and Family: More than three times a week    Frequency of Social Gatherings with Friends and Family: More than three times a week    Attends Religious Services: More than 4 times per year    Active Member of [employer] or Organizations: Yes    Attends Archivist Meetings: More than 4 times per year    Marital Status: Widowed  Intimate Partner Violence: Unknown (03/10/2020)   Humiliation, Afraid, Rape, and Kick questionnaire    Fear of Current or Ex-Partner: Patient refused     Emotionally Abused: Patient refused    Physically Abused: Patient refused    Sexually Abused: Patient refused     PHYSICAL EXAM  GENERAL EXAM/CONSTITUTIONAL: Vitals:  Vitals:   05/21/22 1018  BP: (!) 167/78  Pulse: (!) 59  Weight: 201 lb (91.2 kg)  Height: _0  (1.88 m)   Body mass index is 25.81 kg/m. Wt Readings from Last 3 Encounters:  05/21/22 201 lb (91.2 kg)  03/08/22 198 lb 9.6 oz (90.1 kg)  11/02/21 202 lb 9.6 oz (91.9 kg)   Patient is in no distress; well developed, nourished and groomed; neck is supple  CARDIOVASCULAR: Examination of carotid arteries is normal; no carotid bruits Regular rate and rhythm, no murmurs Examination of peripheral vascular system by observation and palpation is normal  EYES: Ophthalmoscopic exam of optic discs and posterior segments is normal; no papilledema or hemorrhages No results found.  MUSCULOSKELETAL: Gait, strength, tone, movements noted in Neurologic exam below  NEUROLOGIC: MENTAL STATUS:     02/24/2018    1:43 PM  MMSE - Mini Mental State Exam  Orientation to time 5  Orientation to Place 5  Registration 3  Attention/ Calculation 5  Recall 1  Language- name 2 objects 2  Language- repeat 1  Language- follow 3 step command 3  Language- read & follow direction 1  Write a sentence 1  Copy design 1  Total score 28   awake, alert, oriented to person, place and time recent and remote memory intact normal attention and concentration language fluent, comprehension intact, naming intact fund of knowledge appropriate  CRANIAL NERVE:  2nd - no papilledema on fundoscopic exam 2nd, 3rd, 4th, 6th - pupils equal and reactive to light, visual fields full to confrontation, extraocular muscles intact, no nystagmus 5th - facial sensation symmetric 7th - facial strength symmetric 8th - hearing intact 9th - palate elevates symmetrically, uvula midline 11th - shoulder shrug symmetric 12th - tongue protrusion  midline  MOTOR:  normal bulk and tone, full strength in the BUE, BLE  SENSORY:  normal and symmetric to light touch; DECR IN LEFT LEG / FOOT TO VIB AND TEMP; DECR VIB IN BILATERAL TOES  COORDINATION:  finger-nose-finger, fine finger movements normal  REFLEXES:  deep tendon reflexes trace and symmetric; ABSENT IN BLE  GAIT/STATION:  narrow based gait; romberg is positive (1st trial); negative on 2nd trial     DIAGNOSTIC DATA (LABS, IMAGING, TESTING) - I reviewed patient records, labs, notes, testing and imaging myself where available.  Lab Results  Component Value Date   WBC 10.7 (H) 10/23/2021   HGB 14.4 10/23/2021   HCT 46.3 10/23/2021   MCV 95.1 10/23/2021   PLT 263 10/23/2021      Component Value Date/Time   NA 138 10/23/2021 0837   NA 142 10/03/2019 1212   K 3.9 10/23/2021 0837   CL 103 10/23/2021 0837   CO2 29 10/23/2021 0837   GLUCOSE 119 (H) 10/23/2021 0837   BUN 10 10/23/2021 0837   BUN 13 10/03/2019 1212   CREATININE 1.10 10/23/2021 0837   CALCIUM 8.8 (L) 10/23/2021 0837   PROT 7.0 03/03/2022 0927   ALBUMIN 4.2 03/03/2022 0927   AST 21 03/03/2022 0927   ALT 17 03/03/2022 0927   ALKPHOS 46 03/03/2022 0927   BILITOT 1.0 03/03/2022 0927   GFRNONAA >60 10/23/2021 0837   GFRAA 73 10/03/2019 1212   Lab Results  Component Value Date   CHOL 192 05/13/2022   HDL 72 05/13/2022   LDLCALC 108 (H) 05/13/2022   TRIG 65 05/13/2022   CHOLHDL 2.7 05/13/2022   Lab Results  Component Value Date   HGBA1C 5.9 (H) 12/18/2020   No results found for: "VITAMINB12" Lab Results  Component Value Date   TSH 3.14 11/16/2019       ASSESSMENT AND PLAN  88 y.o. year old male here with:   Dx:  1. Numbness in feet     PLAN:  NUMBNESS / COLDNESS IN FEET (1-2 years;  mild, relatively stable symptoms; could be age related idiopathic neuropathy) - check B12 and ABI u/s of BLE - monitor foot hygiene / care  STROKE PREVENTION (right caudate infarct march  2022) - continue eliquis, statin  Orders Placed This Encounter  Procedures   Vitamin B12   VAS [place] ABI WITH/WO TBI   Return for pending if symptoms worsen or fail to improve, pending test results.    Penni Bombard, MD 04/25/7281, 06:01 AM Certified in Neurology, Neurophysiology and Neuroimaging  [facility] [address], [place], [place] [address] [phone]

## 2022-05-22 LAB — VITAMIN B12: Vitamin B-12: 946 pg/mL (ref 232–1245)

## 2022-05-25 ENCOUNTER — Encounter: Payer: Self-pay | Admitting: *Deleted

## 2022-05-27 ENCOUNTER — Ambulatory Visit (HOSPITAL_COMMUNITY)
Admission: RE | Admit: 2022-05-27 | Discharge: 2022-05-27 | Disposition: A | Discharge status: Home / Self Care | Payer: Medicare Other | Source: Ambulatory Visit | Attending: Cardiology | Admitting: Cardiology

## 2022-05-27 DIAGNOSIS — R200 Anesthesia of skin: Secondary | ICD-10-CM | POA: Diagnosis not present

## 2022-05-31 ENCOUNTER — Telehealth: Payer: Self-pay | Admitting: Cardiovascular Disease

## 2022-05-31 ENCOUNTER — Other Ambulatory Visit: Payer: Self-pay

## 2022-05-31 MED ORDER — APIXABAN 5 MG PO TABS: 5.0000 mg | ORAL_TABLET | Freq: Two times a day (BID) | ORAL | 0 refills | Status: DC

## 2022-05-31 NOTE — Telephone Encounter (Signed)
*  STAT* If patient is at the pharmacy, call can be transferred to refill team.   1. Which medications need to be refilled? (please list name of each medication and dose if known) apixaban (ELIQUIS) 5 MG TABS tablet  2. Which pharmacy/location (including street and city if local pharmacy) is medication to be sent to?[id number] - [place], [place] [address]  3. Do they need a 30 day or 90 day supply? 21  Pt states he is completely out and cannot wait for mail service, requesting it be sent to [facility].

## 2022-06-07 ENCOUNTER — Encounter: Payer: Self-pay | Admitting: *Deleted

## 2022-06-24 ENCOUNTER — Ambulatory Visit (INDEPENDENT_AMBULATORY_CARE_PROVIDER_SITE_OTHER): Payer: Medicare Other

## 2022-06-24 VITALS — BP 122/80 | HR 73 | Temp 97.7°F | Ht 74.0 in | Wt 200.6 lb

## 2022-06-24 DIAGNOSIS — Z0000 Encounter for general adult medical examination without abnormal findings: Secondary | ICD-10-CM | POA: Diagnosis not present

## 2022-06-24 NOTE — Progress Notes (Signed)
Subjective:   Fernando Walker is a 88 y.o. male who presents for Medicare Annual/Subsequent preventive examination.  Review of Systems     Cardiac Risk Factors include: advanced age (>29mn, >>90women);dyslipidemia;family history of premature cardiovascular disease;hypertension;male gender     Objective:    Today's Vitals   06/24/22 1534  BP: 122/80  Pulse: 73  Temp: 97.7 F (36.5 C)  SpO2: 97%  Weight: 200 lb 9.6 oz (91 kg)  Height: _0  (1.88 m)  PainSc: 0-No pain   Body mass index is 25.76 kg/m.     06/24/2022    3:49 PM 12/19/2020   12:02 PM 03/10/2020   11:55 AM 07/25/2019    9:47 AM 03/06/2019    1:41 PM 02/24/2018    1:29 PM 11/29/2017   10:09 AM  Advanced Directives  Does Patient Have a Medical Advance Directive? Yes No _1   Type of AParamed[place]of [place]Qu[place]Living will   [place]Living will [place]ber[place] Proving Ground[place] will HSherwood ShoresLiving will Living will;Healthcare Power of Attorney  Does patient want to make changes to medical advance directive?   No - Patient declined No - Patient declined     Copy of [place] Chart? No - copy requested   No - copy requested No - copy requested No - copy requested No - copy requested  Would patient like information on creating a medical advance directive?  No - Patient declined         Current Medications (verified) Outpatient Encounter Medications as of 06/24/2022  Medication Sig   apixaban (ELIQUIS) 5 MG TABS tablet Take 1 tablet (5 mg total) by mouth 2 (two) times daily.   latanoprost (XALATAN) 0.005 % ophthalmic solution Place 1 drop into both eyes at bedtime.   levalbuterol (XOPENEX HFA) 45 MCG/ACT inhaler USE 2 INHALATIONS BY MOUTH IN  THE MORNING AND 2 INHALATIONS BY MOUTH AT NIGHT AS NEEDED FOR  WHEEZING   rosuvastatin (CRESTOR) 5 MG tablet Take 1 tablet (5 mg total) by mouth daily.   sildenafil (VIAGRA) 100 MG  tablet Take 0.5-1 tablets (50-100 mg total) by mouth daily as needed for erectile dysfunction.   triamcinolone ointment (KENALOG) 0.1 % Apply 1 application topically 2 (two) times daily as needed (rash).   vitamin B-12 (CYANOCOBALAMIN) 1000 MCG tablet Take 1,000 mcg by mouth every evening.   No facility-administered encounter medications on file as of 06/24/2022.    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   Hyperlipidemia    Hypertension    Hypertrophy of prostate with urinary obstruction and other lower urinary tract symptoms (LUTS)    Pneumonia    As a child   Pneumonia    as child   Past Surgical History:  Procedure Laterality Date   cataracts     both eyes   [place]ONOSCOPY     [place]   FASCIECTOMY Left 11/29/2017   Procedure: SEGMENTAL FASCIECTOMY LEFT SMALL FINGER;  Surgeon: KDaryll Brod MD;  [facility]  Service:  Orthopedics;  Laterality: Left;  block and MAC   LEFT HEART CATH AND CORS/GRAFTS ANGIOGRAPHY N/A 07/25/2019   Procedure: LEFT HEART CATH AND CORS/GRAFTS ANGIOGRAPHY;  Surgeon: Burnell Blanks, MD;  Location: [facility];  Service: Cardiovascular;  Laterality: N/A;   POLYPECTOMY     Family History  Problem Relation Age of Onset   Stroke Father 29   Diabetes Neg Hx    Cancer Neg Hx    GER disease Neg Hx    Heart attack Neg Hx    Colon cancer Neg Hx    Esophageal cancer Neg Hx    Stomach cancer Neg Hx    Heart disease Neg Hx    Social History   Socioeconomic History   Marital status: Widowed    Spouse name: Not on file   Number of  children: 2   Years of education: 16   Highest education level: Bachelor's degree (e.g., BA, AB, BS)  Occupational History   Occupation: Retired  Tobacco Use   Smoking status: Former    Packs/day: 4.00    Years: 30.00    Total pack years: 120.00    Types: Cigarettes    Quit date: 10/18/1984    Years since quitting: 37.7   Smokeless tobacco: Former  Scientific laboratory technician Use: Never used  Substance and Sexual Activity   Alcohol use: Yes    Alcohol/week: 12.0 standard drinks of alcohol    Types: 7 Glasses of wine, 5 Cans of beer per week   Drug use: No   Sexual activity: Not Currently  Other Topics Concern   Not on file  Social History Narrative   Lives alone   R handed   Caffeine: 2 C of coffee AM   Social Determinants of Health   Financial Resource Strain: Low Risk  (06/24/2022)   Overall Financial Resource Strain (CARDIA)    Difficulty of Paying Living Expenses: Not hard at all  Food Insecurity: No Food Insecurity (06/24/2022)   Hunger Vital Sign    Worried About Running Out of Food in the Last Year: Never true    Ran Out of Food in the Last Year: Never true  Transportation Needs: No Transportation Needs (06/24/2022)   PRAPARE - Hydrologist (Medical): No    Lack of Transportation (Non-Medical): No  Physical Activity: Sufficiently Active (06/24/2022)   Exercise Vital Sign    Days of Exercise per Week: 5 days    Minutes of Exercise per Session: 30 min  Stress: No Stress Concern Present (06/24/2022)   [place]    Feeling of Stress : Not at all  Social Connections: Moderately Integrated (06/24/2022)   Social Connection and Isolation Panel [NHANES]    Frequency of Communication with Friends and Family: More than three times a week    Frequency of Social Gatherings with Friends and Family: More than three times a week    Attends Religious Services: More than 4 times per year    Active  Member of [employer] or Organizations: Yes    Attends Archivist Meetings: More than 4 times per year    Marital Status: Widowed    Tobacco Counseling Counseling given: Not Answered   Clinical Intake:  Pre-visit preparation completed: Yes  Pain : No/denies pain Pain Score: 0-No pain     Nutritional Risks: None Diabetes: No  How often do you need to have someone help you when  you read instructions, pamphlets, or other written materials from your doctor or pharmacy?: 1 - Never What is the last grade level you completed in school?: HSG  Diabetic? no  Interpreter Needed?: No  Information entered by :: Lisette Abu, LPN.   Activities of Daily Living    06/24/2022    4:22 PM 07/31/2021    3:35 PM  In your present state of health, do you have any difficulty performing the following activities:  Hearing? 1 0  Comment wears hearing aids   Vision? 0 0  Difficulty concentrating or making decisions? 0 0  Walking or climbing stairs? 0 0  Dressing or bathing? 0 0  Doing errands, shopping? 0 0  Preparing Food and eating ? N   Using the Toilet? N   In the past six months, have you accidently leaked urine? N   Do you have problems with loss of bowel control? N   Managing your Medications? N   Managing your Finances? N   Housekeeping or managing your Housekeeping? N     Patient Care Team: Binnie Rail, MD as PCP - General (Internal Medicine) Burnell Blanks, MD as PCP - Cardiology (Cardiology) Rutherford Guys, MD as Consulting Physician (Ophthalmology)  Indicate any recent Medical Services you may have received from other than [facility] providers in the past year (date may be approximate).     Assessment:   This is a routine wellness examination for Rayshawn.  Hearing/Vision screen Hearing Screening - Comments:: Patient wears hearing aids. Vision Screening - Comments:: Patient wears eyeglasses.  Eye exam done by: Rutherford Guys, MD.  Dietary issues and exercise  activities discussed: Current Exercise Habits: Home exercise routine, Type of exercise: walking, Time (Minutes): 30, Frequency (Times/Week): 5, Weekly Exercise (Minutes/Week): 150, Intensity: Moderate, Exercise limited by: respiratory conditions(s)   Goals Addressed             This Visit's Progress    To maintain my current health status by continuing to eat healthy, stay physically active and socially active.        Depression Screen    06/24/2022    3:36 PM 02/27/2021    3:17 PM 03/10/2020   11:56 AM 01/15/2020    3:18 PM 08/06/2019   12:08 PM 03/06/2019    1:42 PM 02/24/2018    1:39 PM  PHQ 2/9 Scores  PHQ - 2 Score 0 0   5 2 0  PHQ- 9 Score     9 3 0  Exception Documentation   Other- indicate reason in comment box Other- indicate reason in comment box     Not completed   Patient is still grieving his wife; he is on treatment. pt just lost his wife a few days ago, he is grieving       Fall Risk    06/24/2022    3:51 PM 06/24/2022    3:37 PM 07/31/2021    3:31 PM 03/10/2020   11:56 AM 03/06/2019    1:42 PM  [place] in the past year? 0 0 0 0 0  Number falls in past yr: 0 0 0 0 0  Injury with Fall? 0 0 0 0   Risk for fall due to : No Fall Risks No Fall Risks No Fall Risks No Fall Risks   Follow up Falls prevention discussed Falls prevention discussed Falls evaluation completed Falls evaluation completed;Education provided;Falls prevention discussed     FALL RISK PREVENTION PERTAINING TO THE HOME:  Any stairs in or around the home? Yes  If so, are there any without handrails? No  Home free of loose throw rugs in walkways, pet beds, electrical cords, etc? Yes  Adequate lighting in your home to reduce risk of falls? Yes   ASSISTIVE DEVICES UTILIZED TO PREVENT FALLS:  Life alert? No  Use of a cane, walker or w/c? No  Grab bars in the bathroom? Yes  Shower chair or bench in shower? Yes  Elevated toilet seat or a handicapped toilet? Yes   TIMED UP AND GO:  Was  the test performed? Yes .  Length of time to ambulate 10 feet: 6 sec.   Gait steady and fast without use of assistive device  Cognitive Function:    02/24/2018    1:43 PM  MMSE - Mini Mental State Exam  Orientation to time 5  Orientation to Place 5  Registration 3  Attention/ Calculation 5  Recall 1  Language- name 2 objects 2  Language- repeat 1  Language- follow 3 step command 3  Language- read & follow direction 1  Write a sentence 1  Copy design 1  Total score 28        06/24/2022    3:37 PM  6CIT Screen  What Year? 0 points  What month? 0 points  What time? 0 points  Count back from 20 0 points  Months in reverse 0 points  Repeat phrase 0 points  Total Score 0 points    Immunizations Immunization History  Administered Date(s) Administered   Fluad Quad(high Dose 65+) 07/10/2019, 08/01/2020, 07/22/2021   Influenza Split 07/27/2011, 08/03/2012   Influenza Whole 09/07/2007, 07/24/2008, 07/23/2009, 08/07/2010   Influenza, High Dose Seasonal PF 08/01/2013, 07/20/2017, 07/04/2018   Influenza,inj,Quad PF,6+ Mos 06/26/2014, 08/04/2015   PFIZER(Purple Top)SARS-COV-2 Vaccination 11/09/2019, 12/09/2019, 08/30/2020   Pneumococcal Conjugate-13 11/03/2015   Pneumococcal Polysaccharide-23 09/13/2012   Tdap 04/27/2019    TDAP status: Up to date  Flu Vaccine status: Due, Education has been provided regarding the importance of this vaccine. Advised may receive this vaccine at local pharmacy or Health Dept. Aware to provide a copy of the vaccination record if obtained from local pharmacy or Health Dept. Verbalized acceptance and understanding.  Pneumococcal vaccine status: Up to date  Covid-19 vaccine status: Completed vaccines  Qualifies for Shingles Vaccine? Yes   Zostavax completed No   Shingrix Completed?: No.    Education has been provided regarding the importance of this vaccine. Patient has been advised to call insurance company to determine out of pocket expense if  they have not yet received this vaccine. Advised may also receive vaccine at local pharmacy or Health Dept. Verbalized acceptance and understanding.  Screening Tests Health Maintenance  Topic Date Due   Zoster Vaccines- Shingrix (1 of 2) Never done   COVID-19 Vaccine (4 - Pfizer series) 10/25/2020   INFLUENZA VACCINE  05/18/2022   TETANUS/TDAP  04/26/2029   Pneumonia Vaccine 44+ Years old  Completed   HPV VACCINES  Aged Out    Health Maintenance  Health Maintenance Due  Topic Date Due   Zoster Vaccines- Shingrix (1 of 2) Never done   COVID-19 Vaccine (4 - Pfizer series) 10/25/2020   INFLUENZA VACCINE  05/18/2022    Colorectal cancer screening: No longer required.   Lung Cancer Screening: (Low Dose CT Chest recommended if Age 29-80 years, 30 pack-year currently smoking OR have quit w/in 15years.) does not qualify.   Lung Cancer Screening Referral: no  Additional Screening:  Hepatitis C Screening: does not qualify; Completed no  Vision Screening: Recommended annual ophthalmology exams for early detection of glaucoma and other disorders of the eye. Is the patient up to date with their annual eye exam?  Yes  Who is the provider or what is the name of the office in which the patient attends annual eye exams? Rutherford Guys, MD. If pt is not established with a provider, would they like to be referred to a provider to establish care? No .   Dental Screening: Recommended annual dental exams for proper oral hygiene  Community Resource Referral / Chronic Care Management: CRR required this visit?  No   CCM required this visit?  No      Plan:     I have personally reviewed and noted the following in the patient's chart:   Medical and social history Use of alcohol, tobacco or illicit drugs  Current medications and supplements including opioid prescriptions. Patient is not currently taking opioid prescriptions. Functional ability and status Nutritional status Physical  activity Advanced directives List of other physicians Hospitalizations, surgeries, and ER visits in previous 12 months Vitals Screenings to include cognitive, depression, and falls Referrals and appointments  In addition, I have reviewed and discussed with patient certain preventive protocols, quality metrics, and best practice recommendations. A written personalized care plan for preventive services as well as general preventive health recommendations were provided to patient.     Sheral Flow, LPN   10/21/1028   Nurse Notes:  Normal cognitive status assessed by direct observation by this Nurse Health Advisor. No abnormalities found.

## 2022-06-24 NOTE — Patient Instructions (Signed)
Fernando Walker , Thank you for taking time to come for your Medicare Wellness Visit. I appreciate your ongoing commitment to your health goals. Please review the following plan we discussed and let me know if I can assist you in the future.   Screening recommendations/referrals: Colonoscopy: No longer recommended due to age Recommended yearly ophthalmology/optometry visit for glaucoma screening and checkup Recommended yearly dental visit for hygiene and checkup  Vaccinations: Influenza vaccine: due Fall Season 2023 Pneumococcal vaccine: 09/13/2012, 11/03/2015 Tdap vaccine: 04/27/2019; due every 10 years Shingles vaccine: never done   Covid-19: 11/09/2019, 12/09/2019, 08/30/2020  Advanced directives: Yes  Conditions/risks identified: Yes  Next appointment: Follow up in one year for your annual wellness visit.   Preventive Care 29 Years and Older, Male  Preventive care refers to lifestyle choices and visits with your health care provider that can promote health and wellness. What does preventive care include? A yearly physical exam. This is also called an annual well check. Dental exams once or twice a year. Routine eye exams. Ask your health care provider how often you should have your eyes checked. Personal lifestyle choices, including: Daily care of your teeth and gums. Regular physical activity. Eating a healthy diet. Avoiding tobacco and drug use. Limiting alcohol use. Practicing safe sex. Taking low doses of aspirin every day. Taking vitamin and mineral supplements as recommended by your health care provider. What happens during an annual well check? The services and screenings done by your health care provider during your annual well check will depend on your age, overall health, lifestyle risk factors, and family history of disease. Counseling  Your health care provider may ask you questions about your: Alcohol use. Tobacco use. Drug use. Emotional well-being. Home and  relationship well-being. Sexual activity. Eating habits. History of falls. Memory and ability to understand (cognition). Work and work Statistician. Screening  You may have the following tests or measurements: Height, weight, and BMI. Blood pressure. Lipid and cholesterol levels. These may be checked every 5 years, or more frequently if you are over 48 years old. Skin check. Lung cancer screening. You may have this screening every year starting at age 72 if you have a 30-pack-year history of smoking and currently smoke or have quit within the past 15 years. Fecal occult blood test (FOBT) of the stool. You may have this test every year starting at age 65. Flexible sigmoidoscopy or colonoscopy. You may have a sigmoidoscopy every 5 years or a colonoscopy every 10 years starting at age 27. Prostate cancer screening. Recommendations will vary depending on your family history and other risks. Hepatitis C blood test. Hepatitis B blood test. Sexually transmitted disease (STD) testing. Diabetes screening. This is done by checking your blood sugar (glucose) after you have not eaten for a while (fasting). You may have this done every 1-3 years. Abdominal aortic aneurysm (AAA) screening. You may need this if you are a current or former smoker. Osteoporosis. You may be screened starting at age 76 if you are at high risk. Talk with your health care provider about your test results, treatment options, and if necessary, the need for more tests. Vaccines  Your health care provider may recommend certain vaccines, such as: Influenza vaccine. This is recommended every year. Tetanus, diphtheria, and acellular pertussis (Tdap, Td) vaccine. You may need a Td booster every 10 years. Zoster vaccine. You may need this after age 29. Pneumococcal 13-valent conjugate (PCV13) vaccine. One dose is recommended after age 55. Pneumococcal polysaccharide (PPSV23) vaccine. One dose  is recommended after age 15. Talk to your  health care provider about which screenings and vaccines you need and how often you need them. This information is not intended to replace advice given to you by your health care provider. Make sure you discuss any questions you have with your health care provider. Document Released: 10/31/2015 Document Revised: 06/23/2016 Document Reviewed: 08/05/2015 Elsevier Interactive Patient Education  2017 [place] Prevention in the Home Falls can cause injuries. They can happen to people of all ages. There are many things you can do to make your home safe and to help prevent falls. What can I do on the outside of my home? Regularly fix the edges of walkways and driveways and fix any cracks. Remove anything that might make you trip as you walk through a door, such as a raised step or threshold. Trim any bushes or trees on the path to your home. Use bright outdoor lighting. Clear any walking paths of anything that might make someone trip, such as rocks or tools. Regularly check to see if handrails are loose or broken. Make sure that both sides of any steps have handrails. Any raised decks and porches should have guardrails on the edges. Have any leaves, snow, or ice cleared regularly. Use sand or salt on walking paths during winter. Clean up any spills in your garage right away. This includes oil or grease spills. What can I do in the bathroom? Use night lights. Install grab bars by the toilet and in the tub and shower. Do not use towel bars as grab bars. Use non-skid mats or decals in the tub or shower. If you need to sit down in the shower, use a plastic, non-slip stool. Keep the floor dry. Clean up any water that spills on the floor as soon as it happens. Remove soap buildup in the tub or shower regularly. Attach bath mats securely with double-sided non-slip rug tape. Do not have throw rugs and other things on the floor that can make you trip. What can I do in the bedroom? Use night  lights. Make sure that you have a light by your bed that is easy to reach. Do not use any sheets or blankets that are too big for your bed. They should not hang down onto the floor. Have a firm chair that has side arms. You can use this for support while you get dressed. Do not have throw rugs and other things on the floor that can make you trip. What can I do in the kitchen? Clean up any spills right away. Avoid walking on wet floors. Keep items that you use a lot in easy-to-reach places. If you need to reach something above you, use a strong step stool that has a grab bar. Keep electrical cords out of the way. Do not use floor polish or wax that makes floors slippery. If you must use wax, use non-skid floor wax. Do not have throw rugs and other things on the floor that can make you trip. What can I do with my stairs? Do not leave any items on the stairs. Make sure that there are handrails on both sides of the stairs and use them. Fix handrails that are broken or loose. Make sure that handrails are as long as the stairways. Check any carpeting to make sure that it is firmly attached to the stairs. Fix any carpet that is loose or worn. Avoid having throw rugs at the top or bottom of the stairs.  If you do have throw rugs, attach them to the floor with carpet tape. Make sure that you have a light switch at the top of the stairs and the bottom of the stairs. If you do not have them, ask someone to add them for you. What else can I do to help prevent falls? Wear shoes that: Do not have high heels. Have rubber bottoms. Are comfortable and fit you well. Are closed at the toe. Do not wear sandals. If you use a stepladder: Make sure that it is fully opened. Do not climb a closed stepladder. Make sure that both sides of the stepladder are locked into place. Ask someone to hold it for you, if possible. Clearly mark and make sure that you can see: Any grab bars or handrails. First and last  steps. Where the edge of each step is. Use tools that help you move around (mobility aids) if they are needed. These include: Canes. Walkers. Scooters. Crutches. Turn on the lights when you go into a dark area. Replace any light bulbs as soon as they burn out. Set up your furniture so you have a clear path. Avoid moving your furniture around. If any of your floors are uneven, fix them. If there are any pets around you, be aware of where they are. Review your medicines with your doctor. Some medicines can make you feel dizzy. This can increase your chance of falling. Ask your doctor what other things that you can do to help prevent falls. This information is not intended to replace advice given to you by your health care provider. Make sure you discuss any questions you have with your health care provider. Document Released: 07/31/2009 Document Revised: 03/11/2016 Document Reviewed: 11/08/2014 Elsevier Interactive Patient Education  2017 [employer].

## 2022-07-07 ENCOUNTER — Other Ambulatory Visit: Payer: Self-pay | Admitting: Cardiovascular Disease

## 2022-07-07 DIAGNOSIS — I480 Paroxysmal atrial fibrillation: Secondary | ICD-10-CM

## 2022-07-08 NOTE — Telephone Encounter (Signed)
Prescription refill request for Eliquis received. Indication: Afib  Last office visit:03/08/22 ([place])  Scr:  1.10 (10/23/21)  Age: 89 Weight: 91kg  Appropriate dose and refill sent to requested pharmacy.  

## 2022-08-13 ENCOUNTER — Ambulatory Visit: Payer: Medicare Other | Attending: Cardiovascular Disease

## 2022-08-13 DIAGNOSIS — E785 Hyperlipidemia, unspecified: Secondary | ICD-10-CM

## 2022-08-13 LAB — HEPATIC FUNCTION PANEL
ALT: 20 IU/L (ref 0–44)
AST: 23 IU/L (ref 0–40)
Albumin: 3.8 g/dL (ref 3.7–4.7)
Alkaline Phosphatase: 42 IU/L — ABNORMAL LOW (ref 44–121)
Bilirubin Total: 1.2 mg/dL (ref 0.0–1.2)
Bilirubin, Direct: 0.31 mg/dL (ref 0.00–0.40)
Total Protein: 6.5 g/dL (ref 6.0–8.5)

## 2022-08-13 LAB — LIPID PANEL
Chol/HDL Ratio: 2.1 ratio (ref 0.0–5.0)
Cholesterol, Total: 129 mg/dL (ref 100–199)
HDL: 62 mg/dL (ref >39)
LDL Chol Calc (NIH): 54 mg/dL (ref 0–99)
Triglycerides: 59 mg/dL (ref 0–149)
VLDL Cholesterol Cal: 13 mg/dL (ref 5–40)

## 2022-08-17 ENCOUNTER — Telehealth: Payer: Self-pay | Admitting: Internal Medicine

## 2022-08-17 NOTE — Telephone Encounter (Signed)
Patient called and would like to know if he can get the pneumonia shot and if it is safe to get it the same day as the flu shot.

## 2022-08-17 NOTE — Telephone Encounter (Signed)
Spoke with patient today. 

## 2022-08-19 ENCOUNTER — Ambulatory Visit (INDEPENDENT_AMBULATORY_CARE_PROVIDER_SITE_OTHER): Payer: Medicare Other | Admitting: *Deleted

## 2022-08-19 DIAGNOSIS — Z23 Encounter for immunization: Secondary | ICD-10-CM | POA: Diagnosis not present

## 2022-08-19 NOTE — Progress Notes (Signed)
Administered regular flu shot left deltoid and pneumonia 20 right deltoid. Pt tolerated well.

## 2022-09-20 ENCOUNTER — Other Ambulatory Visit: Payer: Self-pay | Admitting: Cardiovascular Disease

## 2022-09-20 DIAGNOSIS — I480 Paroxysmal atrial fibrillation: Secondary | ICD-10-CM

## 2022-09-20 NOTE — Telephone Encounter (Signed)
Prescription refill request for Eliquis received. Indication: Afib  Last office visit:03/08/22 ([place])  Scr:  1.10 (10/23/21)  Age: 88 Weight: 91kg  Appropriate dose and refill sent to requested pharmacy.

## 2022-10-25 DIAGNOSIS — D485 Neoplasm of uncertain behavior of skin: Secondary | ICD-10-CM | POA: Diagnosis not present

## 2022-10-25 DIAGNOSIS — L111 Transient acantholytic dermatosis [Grover]: Secondary | ICD-10-CM | POA: Diagnosis not present

## 2022-10-25 DIAGNOSIS — C44629 Squamous cell carcinoma of skin of left upper limb, including shoulder: Secondary | ICD-10-CM | POA: Diagnosis not present

## 2022-11-01 ENCOUNTER — Telehealth: Payer: Self-pay | Admitting: Diagnostic Neuroimaging

## 2022-11-01 NOTE — Telephone Encounter (Signed)
Pt is calling. Stated he needs to follow-up with Dr. Leta Baptist because of numbness in his feet is getting worse and the left foot is swelling. Pt was scheduled for March 18 @ 2:15 pm pt stated he can't wait that long to be seen.

## 2022-11-03 NOTE — Telephone Encounter (Signed)
This date is not available do you have another date?

## 2022-11-03 NOTE — Telephone Encounter (Signed)
Will continue to monitor if there is a cancellation but as of this moment I do not have an opening to offer for the patient.

## 2022-12-16 DIAGNOSIS — H401131 Primary open-angle glaucoma, bilateral, mild stage: Secondary | ICD-10-CM | POA: Diagnosis not present

## 2023-01-03 ENCOUNTER — Ambulatory Visit: Payer: Medicare Other | Admitting: Diagnostic Neuroimaging

## 2023-01-09 ENCOUNTER — Other Ambulatory Visit: Payer: Self-pay | Admitting: Internal Medicine

## 2023-03-01 DIAGNOSIS — Z85828 Personal history of other malignant neoplasm of skin: Secondary | ICD-10-CM | POA: Diagnosis not present

## 2023-03-01 DIAGNOSIS — L111 Transient acantholytic dermatosis [Grover]: Secondary | ICD-10-CM | POA: Diagnosis not present

## 2023-03-01 DIAGNOSIS — L720 Epidermal cyst: Secondary | ICD-10-CM | POA: Diagnosis not present

## 2023-03-04 ENCOUNTER — Ambulatory Visit (INDEPENDENT_AMBULATORY_CARE_PROVIDER_SITE_OTHER): Payer: Medicare Other | Admitting: Internal Medicine

## 2023-03-04 ENCOUNTER — Ambulatory Visit (INDEPENDENT_AMBULATORY_CARE_PROVIDER_SITE_OTHER): Payer: Medicare Other

## 2023-03-04 VITALS — BP 138/86 | HR 76 | Temp 98.2°F | Ht 74.0 in | Wt 187.0 lb

## 2023-03-04 DIAGNOSIS — M79641 Pain in right hand: Secondary | ICD-10-CM | POA: Diagnosis not present

## 2023-03-04 DIAGNOSIS — S6991XA Unspecified injury of right wrist, hand and finger(s), initial encounter: Secondary | ICD-10-CM | POA: Diagnosis not present

## 2023-03-04 NOTE — Patient Instructions (Addendum)
     Have an xray downstairs.     Medications changes include :   none     Return if symptoms worsen or fail to improve.

## 2023-03-04 NOTE — Progress Notes (Unsigned)
    Subjective:    Patient ID: Fernando Walker, male    DOB: [birth year], 89 y.o.   MRN: [id number]      HPI Jatavis is here for  Chief Complaint  Patient presents with   Hand Injury    Patient injured hand. Hit had on door knob 4-5 weeks ago; Still sore     Right hand injury - coming out of med room and hit knuckle on right hand on the door jam.  It felt better - coupleof days ago it started swelling.   This was 3-5 weeks ago  put on bengay.  Hit between 2-3nd knuckle.   The swelling has come down.      Medications and allergies reviewed with patient and updated if appropriate.  Current Outpatient Medications on File Prior to Visit  Medication Sig Dispense Refill   ELIQUIS 5 MG TABS tablet TAKE 1 TABLET BY MOUTH TWICE  DAILY 180 tablet 3   latanoprost (XALATAN) 0.005 % ophthalmic solution Place 1 drop into both eyes at bedtime.     levalbuterol (XOPENEX HFA) 45 MCG/ACT inhaler USE 2 INHALATIONS BY MOUTH IN  THE MORNING AND 2 INHALATIONS BY MOUTH AT NIGHT AS NEEDED FOR  WHEEZING 30 g 0   rosuvastatin (CRESTOR) 5 MG tablet Take 1 tablet (5 mg total) by mouth daily. 90 tablet 3   sildenafil (VIAGRA) 100 MG tablet Take 0.5-1 tablets (50-100 mg total) by mouth daily as needed for erectile dysfunction. 5 tablet 11   triamcinolone ointment (KENALOG) 0.1 % Apply 1 application topically 2 (two) times daily as needed (rash).     vitamin B-12 (CYANOCOBALAMIN) 1000 MCG tablet Take 1,000 mcg by mouth every evening.     No current facility-administered medications on file prior to visit.    Review of Systems     Objective:   Vitals:   03/04/23 1412  BP: 138/86  Pulse: 76  Temp: 98.2 F (36.8 C)  SpO2: 99%   BP Readings from Last 3 Encounters:  03/04/23 138/86  06/24/22 122/80  05/21/22 (!) 167/78   Wt Readings from Last 3 Encounters:  03/04/23 187 lb (84.8 kg)  06/24/22 200 lb 9.6 oz (91 kg)  05/21/22 201 lb (91.2 kg)   Body mass index is 24.01 kg/m.    Physical  Exam Constitutional:      General: He is not in acute distress.    Appearance: Normal appearance. He is not ill-appearing.  HENT:     Head: Normocephalic and atraumatic.  Musculoskeletal:     Comments: Right 2nd medial MCP joint with mild swelling and tenderness with palpation   Skin:    General: Skin is warm and dry.  Neurological:     Mental Status: He is alert. Mental status is at baseline.     Sensory: No sensory deficit.     Motor: No weakness.            Assessment & Plan:    See Problem List for Assessment and Plan of chronic medical problems.

## 2023-03-05 ENCOUNTER — Encounter: Payer: Self-pay | Admitting: Internal Medicine

## 2023-03-05 DIAGNOSIS — M79641 Pain in right hand: Secondary | ICD-10-CM | POA: Insufficient documentation

## 2023-03-05 NOTE — Assessment & Plan Note (Signed)
Subacute Injured in a few weeks ago - still with pain, swelling Tender 2nd right medial MCP joint Will get an xray Not sure if treatment at this point is required, but given pain will evaluate if anything can be done

## 2023-03-21 ENCOUNTER — Telehealth: Payer: Self-pay | Admitting: Cardiovascular Disease

## 2023-03-21 NOTE — Progress Notes (Unsigned)
No chief complaint on file.  History of Present Illness: 89 yo male with history of CAD, paroxysmal atrial fibrillation, mild to moderate aortic stenosis, HTN, piror CVA and hyperlipidemia who is here today for cardiac follow up. He underwent 7V CABG in 1995. Mild aortic stenosis noted on echo in 2019. He was seen in our office September 2020 with c/o chest tightness. Cardiac cath October 2020 with 6/7 patent bypass grafts. The distal limb of the SVG to the RV marginal was chronically occluded. Echo in 2020 with normal LVEF and mild AS. Cardiac monitor October 2020 showed atrial fibrillation with longest episode 1 hour. Stroke beginning of March 2022. MRI with 5 mm caudate infarct. Echo 12/19/20 with LVEF=60-65%. Trivial MR. Mild to moderate AS. Carotid artery dopplers March 2022 with mild bilateral carotid artery disease. Most recent echo June 2023 with LVEF=60-65%. Mild to moderate aortic stenosis with mean gradient 18.8 mmHg, AVA 1.6 cm2.   He is here today for follow up. The patient denies any chest pain, dyspnea, palpitations, lower extremity edema, orthopnea, PND, dizziness, near syncope or syncope.    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   Hyperlipidemia    Hypertension    Hypertrophy of prostate with urinary obstruction and other lower urinary tract symptoms (LUTS)    Pneumonia    As a child   Pneumonia    as child    Past Surgical History:  Procedure Laterality Date   cataracts     both eyes   COLONOSCOPY     colonoscopy with polypectomy  2004   CORONARY ARTERY BYPASS GRAFT  1995   X 7   FASCIECTOMY Left 11/29/2017   Procedure: SEGMENTAL FASCIECTOMY LEFT SMALL FINGER;  Surgeon: Cindee Salt, MD;  Location: [facility];  Service: Orthopedics;  Laterality: Left;  block and MAC   LEFT HEART CATH AND CORS/GRAFTS ANGIOGRAPHY N/A 07/25/2019   Procedure: LEFT HEART CATH AND CORS/GRAFTS ANGIOGRAPHY;  Surgeon: Kathleene Hazel, MD;  Location: [facility];  Service: Cardiovascular;  Laterality: N/A;   POLYPECTOMY      Current Outpatient Medications  Medication Sig Dispense Refill   ELIQUIS 5 MG TABS tablet TAKE 1 TABLET BY MOUTH TWICE  DAILY 180 tablet 3   latanoprost (XALATAN) 0.005 % ophthalmic solution Place 1 drop into both eyes at bedtime.     levalbuterol (XOPENEX HFA) 45 MCG/ACT inhaler USE 2 INHALATIONS BY MOUTH IN  THE MORNING AND 2 INHALATIONS BY MOUTH AT NIGHT AS NEEDED FOR  WHEEZING 30 g 0   rosuvastatin (CRESTOR) 5 MG tablet Take 1 tablet (5 mg total) by mouth daily. 90 tablet 3   sildenafil (VIAGRA) 100 MG tablet Take 0.5-1 tablets (50-100 mg total) by mouth daily as needed for erectile dysfunction. 5 tablet 11   triamcinolone ointment (KENALOG) 0.1 % Apply 1 application topically 2 (two) times daily as needed (rash).     vitamin B-12 (CYANOCOBALAMIN) 1000 MCG tablet Take 1,000 mcg by mouth every evening.     No current facility-administered medications for this visit.    Allergies  Allergen Reactions   Sulfonamide Derivatives Rash   Spiriva Respimat [Tiotropium Bromide Monohydrate] Other (See Comments)    pharyngitis  Social History   Socioeconomic History   Marital status: Widowed    Spouse name: Not on file   Number of children: 2   Years of education: 16   Highest education level: Bachelor's degree (e.g., BA, AB, BS)  Occupational History   Occupation: Retired  Tobacco Use   Smoking status: Former    Packs/day: 4.00    Years: 30.00    Additional pack years: 0.00    Total pack years: 120.00    Types: Cigarettes    Quit date: 10/18/1984    Years since quitting: 38.4   Smokeless tobacco: Former  Building services engineer Use: Never used  Substance and Sexual  Activity   Alcohol use: Yes    Alcohol/week: 12.0 standard drinks of alcohol    Types: 7 Glasses of wine, 5 Cans of beer per week   Drug use: No   Sexual activity: Not Currently  Other Topics Concern   Not on file  Social History Narrative   Lives alone   R handed   Caffeine: 2 C of coffee AM   Social Determinants of Health   Financial Resource Strain: Low Risk  (06/24/2022)   Overall Financial Resource Strain (CARDIA)    Difficulty of Paying Living Expenses: Not hard at all  Food Insecurity: No Food Insecurity (06/24/2022)   Hunger Vital Sign    Worried About Running Out of Food in the Last Year: Never true    Ran Out of Food in the Last Year: Never true  Transportation Needs: No Transportation Needs (06/24/2022)   PRAPARE - Administrator, Civil Service (Medical): No    Lack of Transportation (Non-Medical): No  Physical Activity: Sufficiently Active (06/24/2022)   Exercise Vital Sign    Days of Exercise per Week: 5 days    Minutes of Exercise per Session: 30 min  Stress: No Stress Concern Present (06/24/2022)   [employer] of Occupational Health - Occupational Stress Questionnaire    Feeling of Stress : Not at all  Social Connections: Moderately Integrated (06/24/2022)   Social Connection and Isolation Panel [NHANES]    Frequency of Communication with Friends and Family: More than three times a week    Frequency of Social Gatherings with Friends and Family: More than three times a week    Attends Religious Services: More than 4 times per year    Active Member of [employer] or Organizations: Yes    Attends Banker Meetings: More than 4 times per year    Marital Status: Widowed  Intimate Partner Violence: Not At Risk (06/24/2022)   Humiliation, Afraid, Rape, and Kick questionnaire    Fear of Current or Ex-Partner: No    Emotionally Abused: No    Physically Abused: No    Sexually Abused: No    Family History  Problem Relation Age of Onset   Stroke Father  59   Diabetes Neg Hx    Cancer Neg Hx    GER disease Neg Hx    Heart attack Neg Hx    Colon cancer Neg Hx    Esophageal cancer Neg Hx    Stomach cancer Neg Hx    Heart disease Neg Hx     Review of Systems:  As stated in the HPI and otherwise negative.   There were no vitals taken for this visit.  Physical Examination: General: Well developed, well nourished, NAD  HEENT: OP clear, mucus membranes moist  SKIN: warm, dry. No rashes. Neuro: No  focal deficits  Musculoskeletal: Muscle strength 5/5 all ext  Psychiatric: Mood and affect normal  Neck: No JVD, no carotid bruits, no thyromegaly, no lymphadenopathy.  Lungs:Clear bilaterally, no wheezes, rhonci, crackles Cardiovascular: Regular rate and rhythm. No murmurs, gallops or rubs. Abdomen:Soft. Bowel sounds present. Non-tender.  Extremities: No lower extremity edema. Pulses are 2 + in the bilateral DP/PT.  Echo June 2023:   1. Left ventricular ejection fraction, by estimation, is 60 to 65%. The  left ventricle has normal function. The left ventricle has no regional  wall motion abnormalities. Left ventricular diastolic parameters are  indeterminate.   2. Right ventricular systolic function is normal. The right ventricular  size is normal.   3. The mitral valve is normal in structure. Trivial mitral valve  regurgitation. No evidence of mitral stenosis. Moderate mitral annular  calcification.   4. The aortic valve is calcified. There is moderate calcification of the  aortic valve. There is moderate thickening of the aortic valve. Aortic  valve regurgitation is not visualized. Mild to moderate aortic valve  stenosis. Aortic valve area, by VTI  measures 1.61 cm. Aortic valve mean gradient measures 18.8 mmHg. Aortic  valve Vmax measures 2.89 m/s.   5. The inferior vena cava is normal in size with greater than 50%  respiratory variability, suggesting right atrial pressure of 3 mmHg.   Cardiac cath October 2020: 1. Severe triple  vessel CAD with chronic occlusion of the distal left main artery and the proximal RCA s/p 7V CABG with 6/7 patent bypass grafts 2. Patent LIMA to LAD 3. Patent sequential SVG to Diagonal 1 and Diagonal 2.  4. Patent sequential SVG to obtuse marginal 1 and obtuse marginal 2 5. Patent SVG to RV marginal branch with chronic occlusion of the distal sequential limb of the graft to the PDA.   Diagnostic Dominance: Right  Intervention    EKG:  EKG is *** ordered today. The ekg ordered today demonstrates   Recent Labs: 08/13/2022: ALT 20   Lipid Panel    Component Value Date/Time   CHOL 129 08/13/2022 0922   TRIG 59 08/13/2022 0922   HDL 62 08/13/2022 0922   CHOLHDL 2.1 08/13/2022 0922   CHOLHDL 2.2 12/18/2020 1839   VLDL 12 12/18/2020 1839   LDLCALC 54 08/13/2022 0922     Wt Readings from Last 3 Encounters:  03/04/23 84.8 kg  06/24/22 91 kg  05/21/22 91.2 kg    Assessment and Plan:   1. CAD s/p CABG without angina: No chest pain suggestive of angina. Cardiac cath October 2020 with stable CAD. Continue ASA and statin. He does not wish to take a beta blocker.   2. HTN: BP is controlled today. No changes.   3. Hyperlipidemia: LDL at goal in October 2023. Continue Crestor.    4. Carotid artery disease: He has mild bilateral carotid artery disease by dopplers in November 2017 and then again March 2022. No plans to repeat given age and stability of disease over time.   5. Aortic stenosis: Moderate AS by echo June 2023. *** Will repeat echo now.   6. Atrial fibrillation, paroxysmal:  Sinus today on exam. CHADS VASC score is 4. He was seen by EP in January 2021. Continue Eliquis. He does not wish to take a beta blocker.    Labs/ tests ordered today include:  No orders of the defined types were placed in this encounter.  Disposition: Follow up with me in 12 months.   Signed, Verne Carrow, MD  03/21/2023 2:44 PM    [facility] [address] [place],  [place], [place]  [address] Phone: [phone]; Fax: [phone]

## 2023-03-21 NOTE — Telephone Encounter (Signed)
Attempted to contact patient, no answer/unable to leave message.  Patient has appt with Dr. Clifton James tomorrow 03/22/23. If Dr. Clifton James wants any labs he will order them at tomorrow's visit.

## 2023-03-21 NOTE — Telephone Encounter (Signed)
Patient calling to see if he needs lab work prior to his appt. Please advise

## 2023-03-22 ENCOUNTER — Encounter: Payer: Self-pay | Admitting: Cardiovascular Disease

## 2023-03-22 ENCOUNTER — Ambulatory Visit: Payer: Medicare Other | Attending: Cardiovascular Disease | Admitting: Cardiovascular Disease

## 2023-03-22 VITALS — BP 120/70 | HR 72 | Ht 74.0 in | Wt 194.8 lb

## 2023-03-22 DIAGNOSIS — I2510 Atherosclerotic heart disease of native coronary artery without angina pectoris: Secondary | ICD-10-CM

## 2023-03-22 DIAGNOSIS — I10 Essential (primary) hypertension: Secondary | ICD-10-CM | POA: Diagnosis not present

## 2023-03-22 DIAGNOSIS — E785 Hyperlipidemia, unspecified: Secondary | ICD-10-CM | POA: Insufficient documentation

## 2023-03-22 DIAGNOSIS — I480 Paroxysmal atrial fibrillation: Secondary | ICD-10-CM | POA: Insufficient documentation

## 2023-03-22 DIAGNOSIS — I350 Nonrheumatic aortic (valve) stenosis: Secondary | ICD-10-CM | POA: Diagnosis not present

## 2023-03-22 NOTE — Patient Instructions (Signed)
Medication Instructions:  No changes *If you need a refill on your cardiac medications before your next appointment, please call your pharmacy*   Lab Work: none   Testing/Procedures: Your physician has requested that you have an echocardiogram. Echocardiography is a painless test that uses sound waves to create images of your heart. It provides your doctor with information about the size and shape of your heart and how well your heart's chambers and valves are working. This procedure takes approximately one hour. There are no restrictions for this procedure. Please do NOT wear cologne, perfume, aftershave, or lotions (deodorant is allowed). Please arrive 15 minutes prior to your appointment time.    Follow-Up: At [facility], you and your health needs are our priority.  As part of our continuing mission to provide you with exceptional heart care, we have created designated Provider Care Teams.  These Care Teams include your primary Cardiologist (physician) and Advanced Practice Providers (APPs -  Physician Assistants and Nurse Practitioners) who all work together to provide you with the care you need, when you need it.    Your next appointment:   12 month(s)  Provider:   Christopher McAlhany, MD     

## 2023-04-03 ENCOUNTER — Other Ambulatory Visit: Payer: Self-pay | Admitting: Cardiovascular Disease

## 2023-04-16 ENCOUNTER — Other Ambulatory Visit: Payer: Self-pay | Admitting: Internal Medicine

## 2023-04-27 ENCOUNTER — Ambulatory Visit (HOSPITAL_COMMUNITY): Payer: Medicare Other | Attending: Internal Medicine

## 2023-04-27 DIAGNOSIS — I350 Nonrheumatic aortic (valve) stenosis: Secondary | ICD-10-CM

## 2023-04-27 DIAGNOSIS — I480 Paroxysmal atrial fibrillation: Secondary | ICD-10-CM

## 2023-04-27 DIAGNOSIS — I2510 Atherosclerotic heart disease of native coronary artery without angina pectoris: Secondary | ICD-10-CM | POA: Diagnosis not present

## 2023-04-27 LAB — ECHOCARDIOGRAM COMPLETE
AR max vel: 1.16 cm2
AV Area VTI: 1.23 cm2
AV Area mean vel: 1.08 cm2
AV Mean grad: 21.2 mmHg
AV Peak grad: 35.9 mmHg
Ao pk vel: 3 m/s
Area-P 1/2: 3.03 cm2
S' Lateral: 2.7 cm

## 2023-05-22 IMAGING — DX DG CHEST 1V PORT
2 series · 2 of 2 positions shown · non-contrast
Comparison: Chest x-ray 12/18/2020

CLINICAL DATA: Palpitations.

EXAM:
PORTABLE CHEST 1 VIEW

[chest ap (1 of 2)]
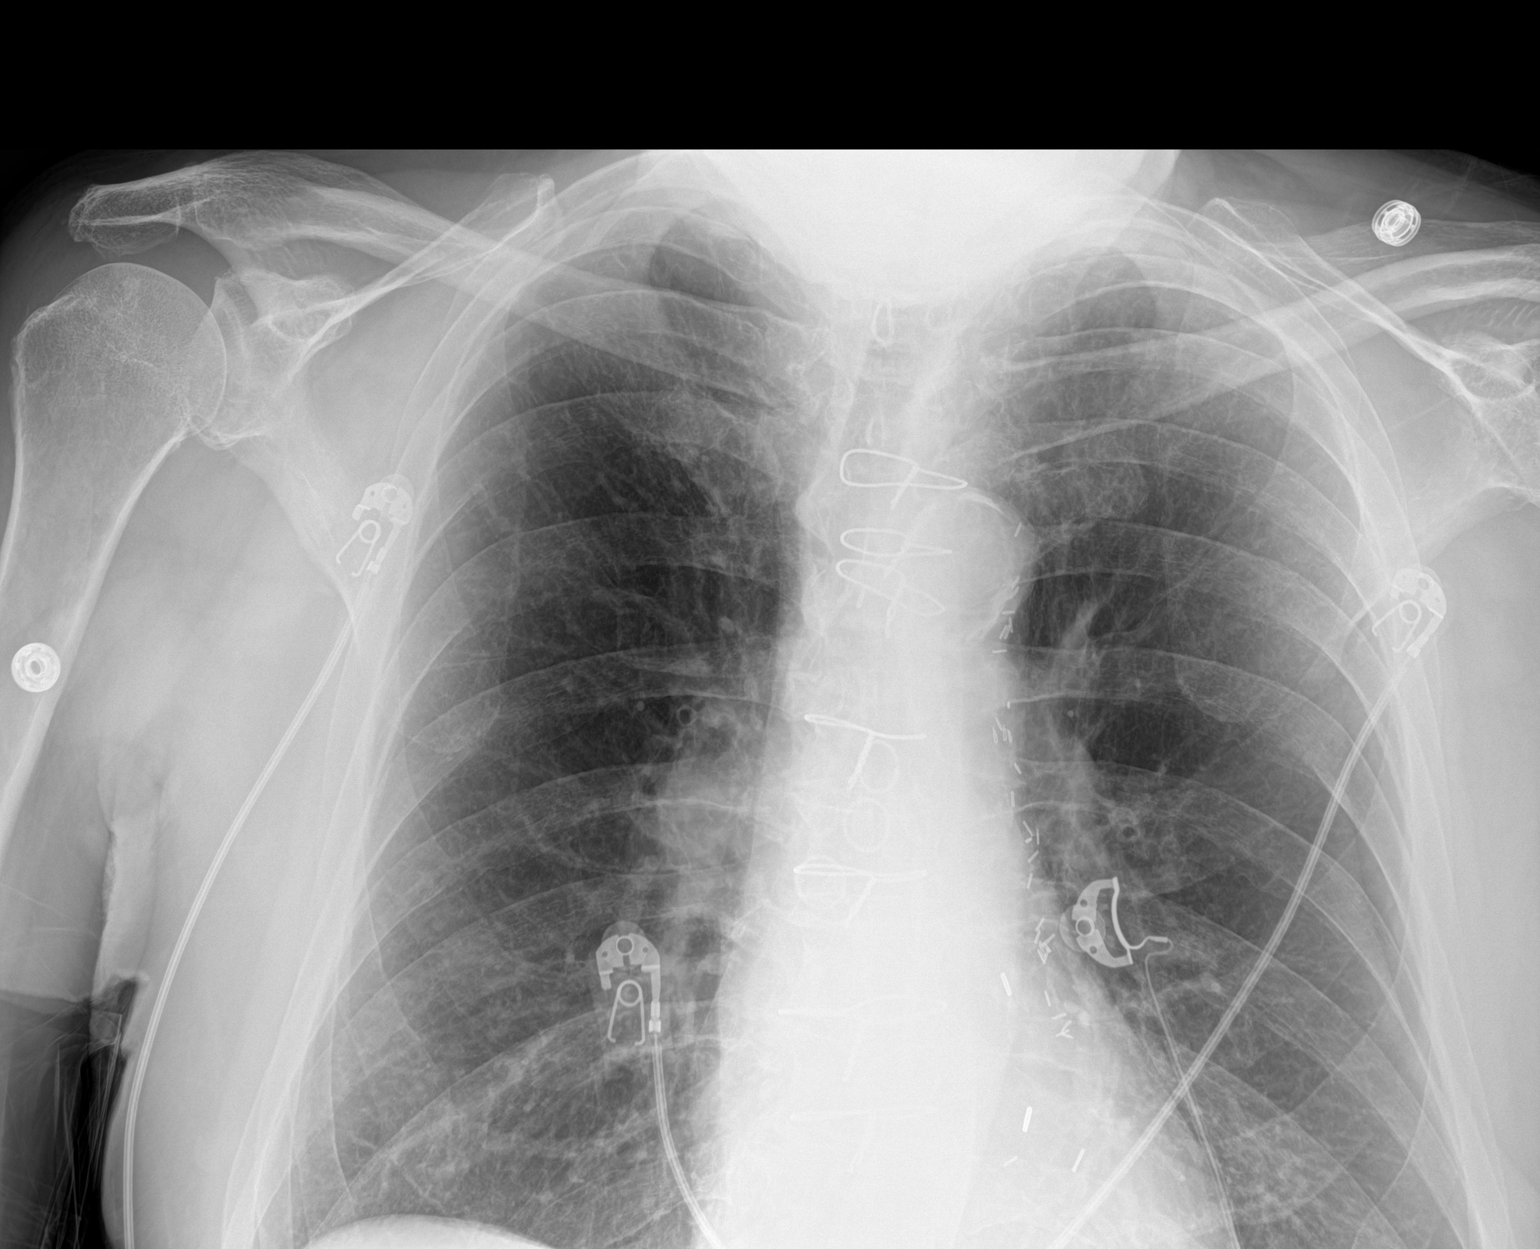

[chest ap (2 of 2)]
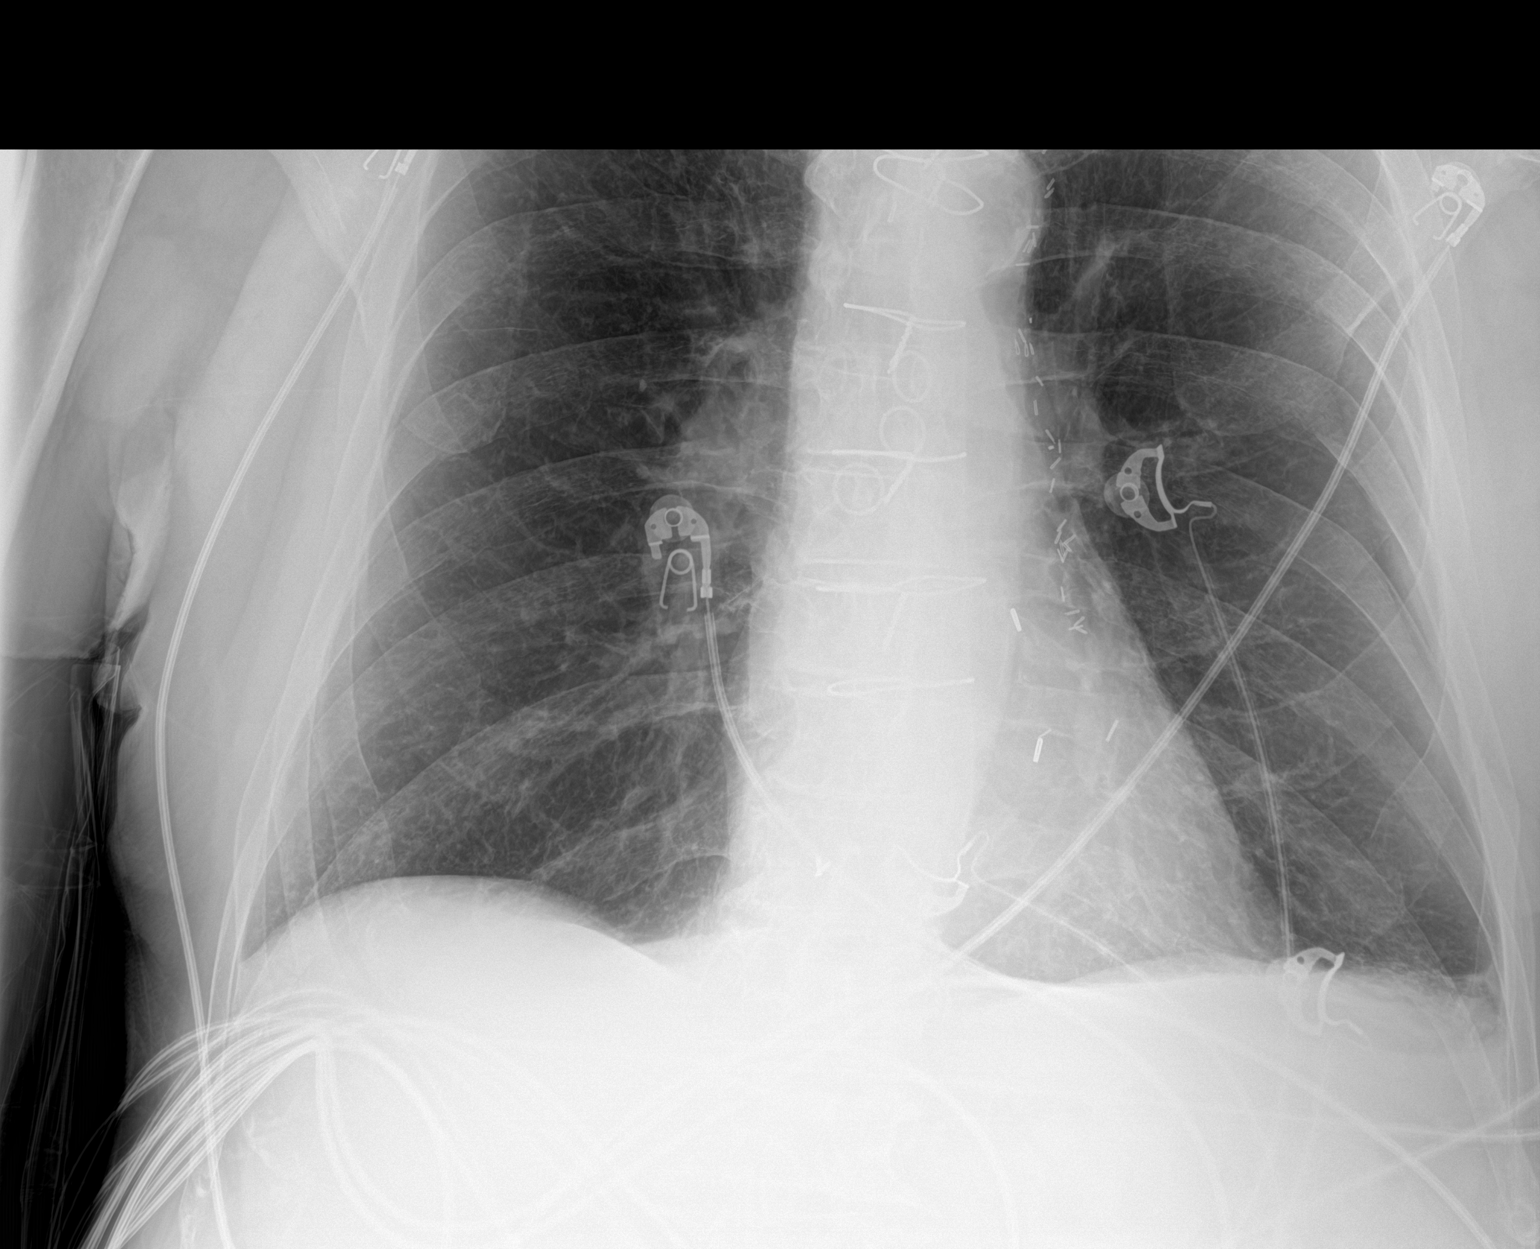

[2 of 2 positions shown; findings below may reference images not displayed]

FINDINGS: The cardiac silhouette, mediastinal and hilar contours are within
normal limits. Stable surgical changes from bypass surgery.

The lungs are clear of an acute process. No infiltrates, edema or
effusions.
IMPRESSION: No acute cardiopulmonary findings.

## 2023-06-07 ENCOUNTER — Encounter: Payer: Self-pay | Admitting: Internal Medicine

## 2023-06-07 NOTE — Progress Notes (Unsigned)
    Subjective:    Patient ID: Fernando Walker, male    DOB: 1933-02-01, 89 y.o.   MRN: [id number]      HPI Fernando Walker is here for No chief complaint on file.        Medications and allergies reviewed with patient and updated if appropriate.  Current Outpatient Medications on File Prior to Visit  Medication Sig Dispense Refill   ELIQUIS 5 MG TABS tablet TAKE 1 TABLET BY MOUTH TWICE  DAILY 180 tablet 3   latanoprost (XALATAN) 0.005 % ophthalmic solution Place 1 drop into both eyes at bedtime.     levalbuterol (XOPENEX HFA) 45 MCG/ACT inhaler USE 2 INHALATIONS BY MOUTH IN  THE MORNING AND 2 INHALATIONS BY MOUTH AT NIGHT AS NEEDED FOR  WHEEZING 30 g 3   rosuvastatin (CRESTOR) 5 MG tablet TAKE 1 TABLET BY MOUTH DAILY 90 tablet 3   sildenafil (VIAGRA) 100 MG tablet Take 0.5-1 tablets (50-100 mg total) by mouth daily as needed for erectile dysfunction. 5 tablet 11   triamcinolone ointment (KENALOG) 0.1 % Apply 1 application topically 2 (two) times daily as needed (rash).     vitamin B-12 (CYANOCOBALAMIN) 1000 MCG tablet Take 1,000 mcg by mouth every evening.     No current facility-administered medications on file prior to visit.    Review of Systems     Objective:  There were no vitals filed for this visit. BP Readings from Last 3 Encounters:  03/22/23 120/70  03/04/23 138/86  06/24/22 122/80   Wt Readings from Last 3 Encounters:  03/22/23 194 lb 12.8 oz (88.4 kg)  03/04/23 187 lb (84.8 kg)  06/24/22 200 lb 9.6 oz (91 kg)   There is no height or weight on file to calculate BMI.    Physical Exam         Assessment & Plan:    See Problem List for Assessment and Plan of chronic medical problems.

## 2023-06-08 ENCOUNTER — Ambulatory Visit (INDEPENDENT_AMBULATORY_CARE_PROVIDER_SITE_OTHER): Payer: Medicare Other

## 2023-06-08 ENCOUNTER — Ambulatory Visit (INDEPENDENT_AMBULATORY_CARE_PROVIDER_SITE_OTHER): Payer: Medicare Other | Admitting: Internal Medicine

## 2023-06-08 VITALS — BP 102/76 | HR 80 | Temp 98.0°F | Ht 74.0 in | Wt 190.0 lb

## 2023-06-08 DIAGNOSIS — R062 Wheezing: Secondary | ICD-10-CM | POA: Diagnosis not present

## 2023-06-08 DIAGNOSIS — J441 Chronic obstructive pulmonary disease with (acute) exacerbation: Secondary | ICD-10-CM | POA: Diagnosis not present

## 2023-06-08 DIAGNOSIS — J449 Chronic obstructive pulmonary disease, unspecified: Secondary | ICD-10-CM | POA: Diagnosis not present

## 2023-06-08 DIAGNOSIS — R059 Cough, unspecified: Secondary | ICD-10-CM | POA: Diagnosis not present

## 2023-06-08 MED ORDER — PREDNISONE 20 MG PO TABS: 40.0000 mg | ORAL_TABLET | Freq: Every day | ORAL | 0 refills | Status: AC

## 2023-06-08 NOTE — Assessment & Plan Note (Addendum)
Acute Recent viral illness that he seems to be getting over Increased cough, wheeze Chronic SOB - not really increased Lower O2 sat than usually  - today at home it was 88% CXR today Doubt infection so will hold off on abx Prednisone for a few days Continue neb treatments

## 2023-06-08 NOTE — Patient Instructions (Addendum)
     Have a chest xray today downstairs.    Medications changes include :   prednisone 40 mg daily for 5 days     Return if symptoms worsen or fail to improve.

## 2023-07-19 IMAGING — MR MR HEAD W/O CM
6 of 10 series · 28 of 48 positions shown · non-contrast
Comparison: Head CT 9027 hours today.  Brain MRI 12/18/2020.

CLINICAL DATA: 88-year-old male with dizziness and nausea.

EXAM:
MRI HEAD WITHOUT CONTRAST
TECHNIQUE: Multiplanar, multiecho pulse sequences of the brain and surrounding
structures were obtained without intravenous contrast.

[Series 2: DWI · axial · 3.0mm · 0.94mm/px · z∈[-43,+104]mm · 8 of 100 slices shown (1 of 2)]
[im 1/100]
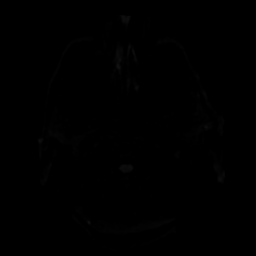
[im 12/100]
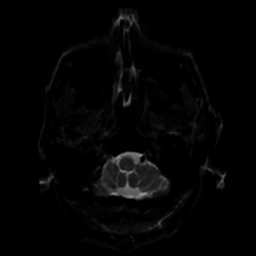
[im 34/100]
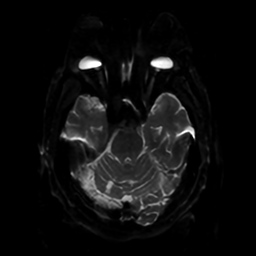
[im 45/100]
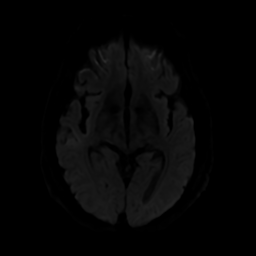
[im 56/100]
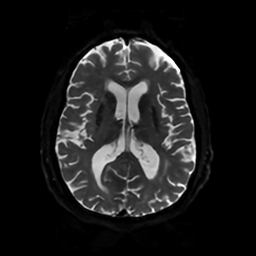
[im 67/100]
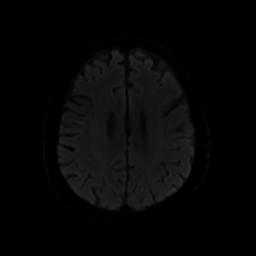
[im 89/100]
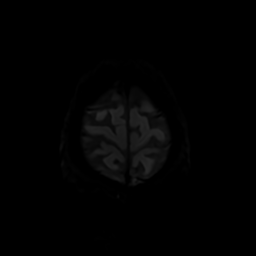
[im 100/100]
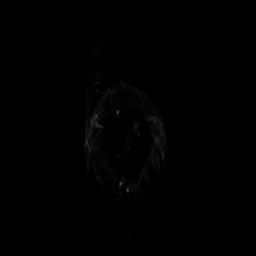

[Series 3: DWI · coronal · 4.0mm · 0.94mm/px · 7 of 72 slices shown (2 of 2)]
[im 1/72]
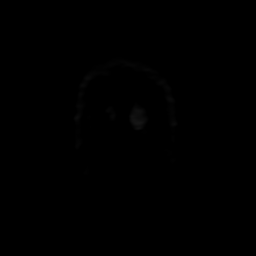
[im 12/72]
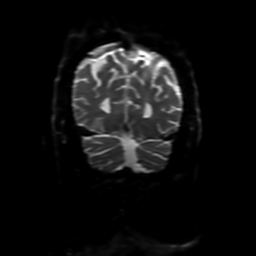
[im 24/72]
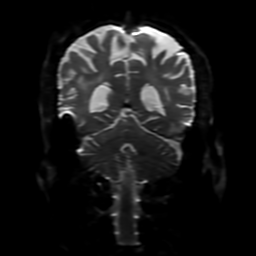
[im 36/72]
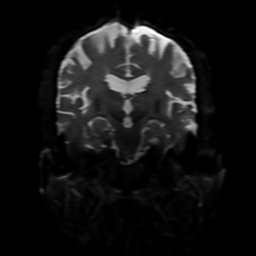
[im 48/72]
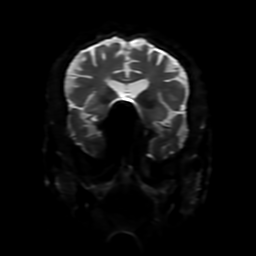
[im 60/72]
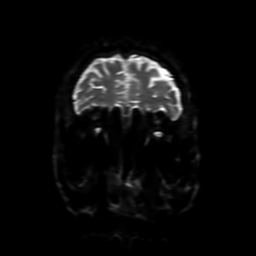
[im 72/72]
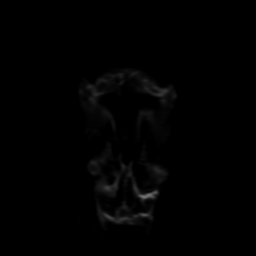

[Series 4: FLAIR · sagittal · 5.0mm · 0.23mm/px · 2 of 23 slices shown (1 of 2)]
[im 1/23]
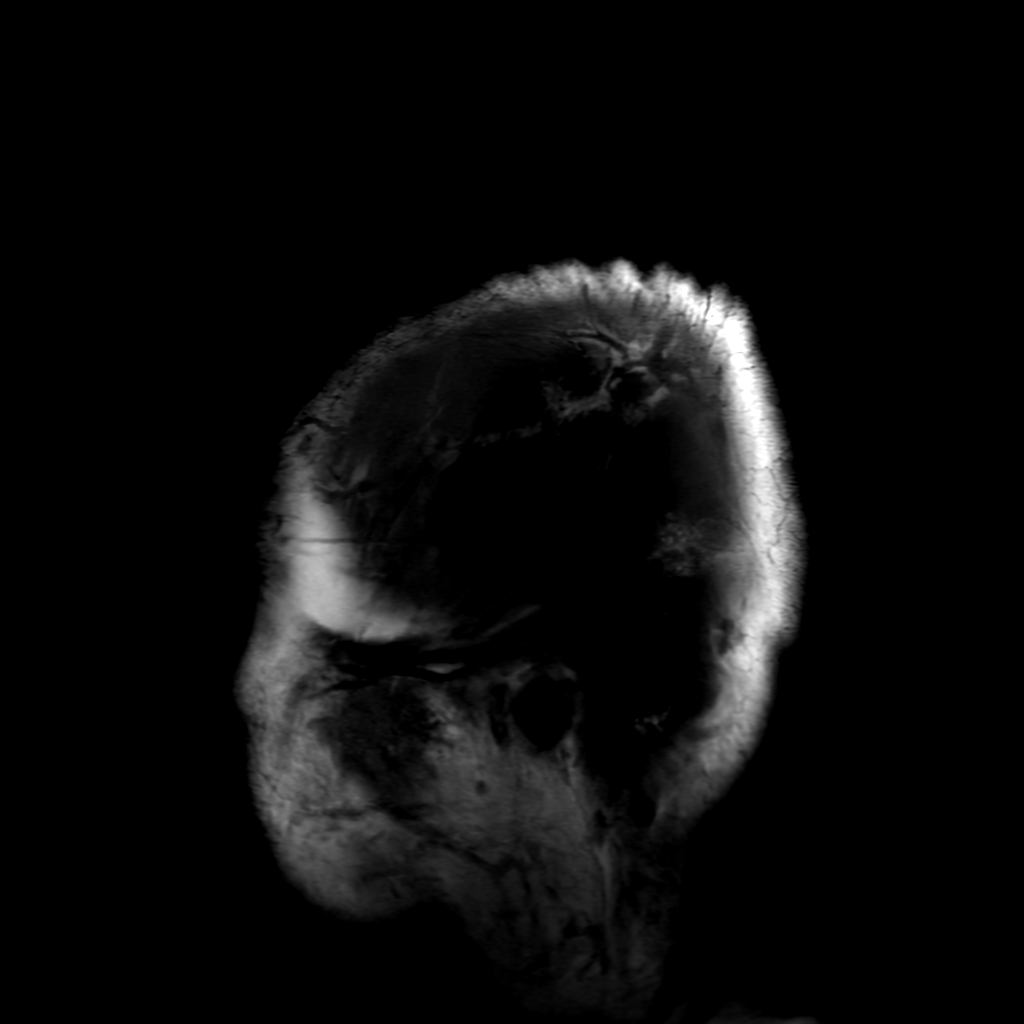
[im 23/23]
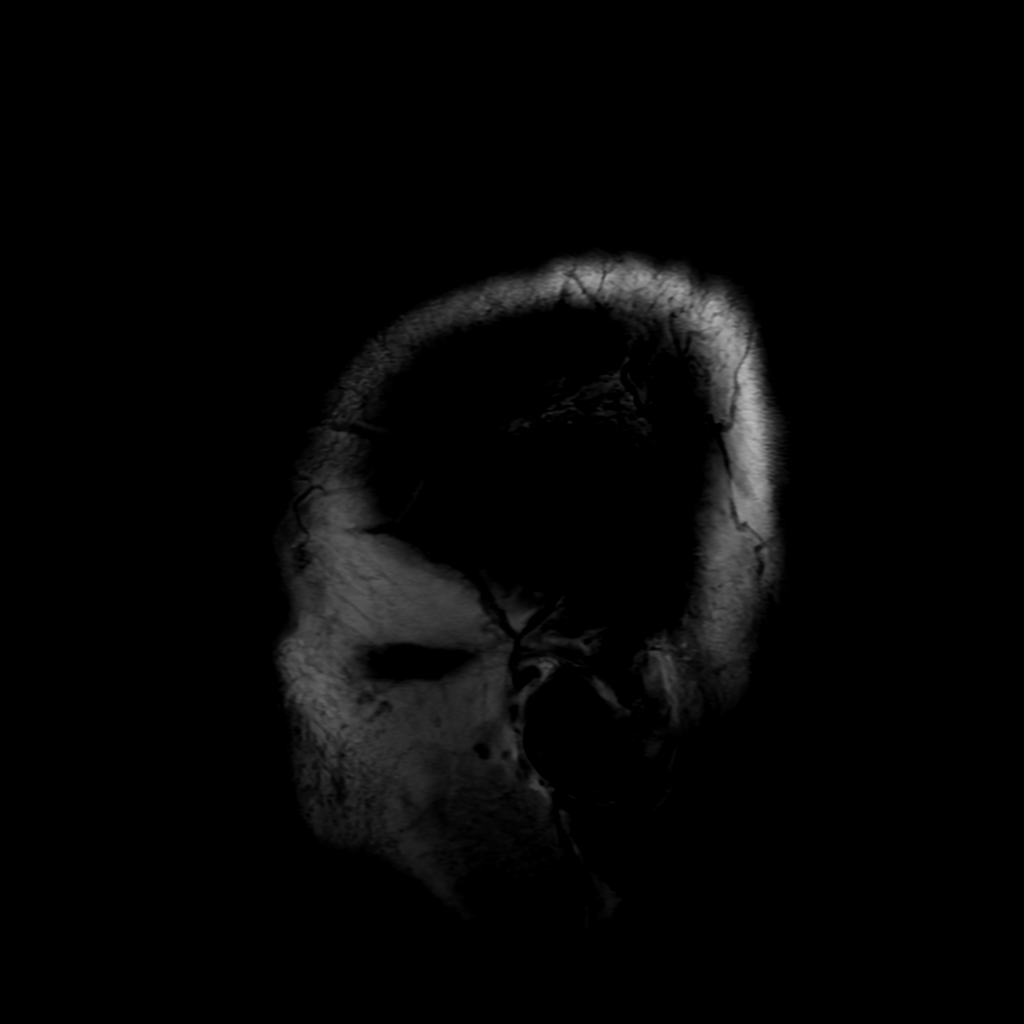

[Series 6: FLAIR · axial · 4.0mm · 0.45mm/px · z∈[-40,+101]mm · 3 of 33 slices shown (2 of 2)]
[im 1/33]
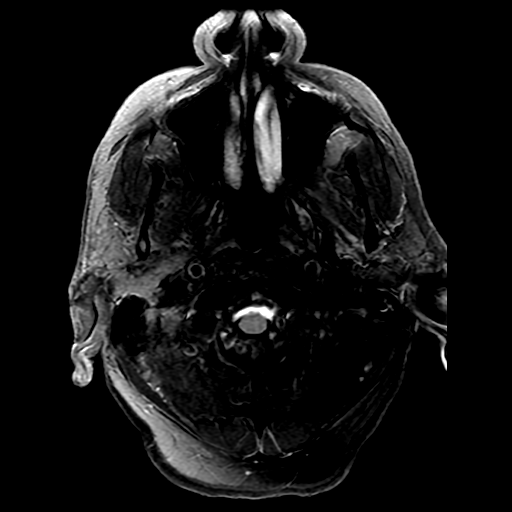
[im 17/33]
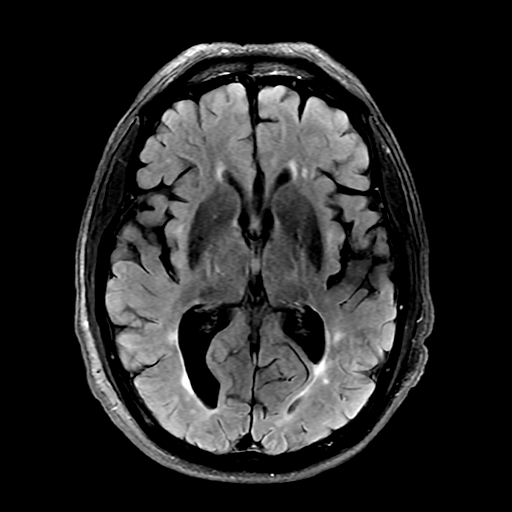
[im 33/33]
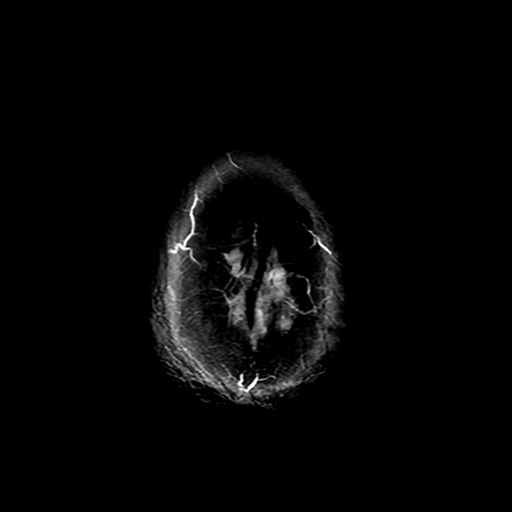

[Series 250: ADC · axial · 3.0mm · 0.94mm/px · z∈[-43,+104]mm · 5 of 49 slices shown (1 of 2)]
[im 1/49]
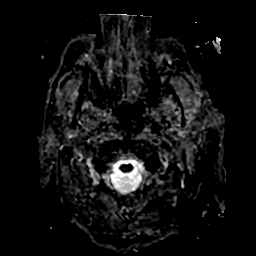
[im 13/49]
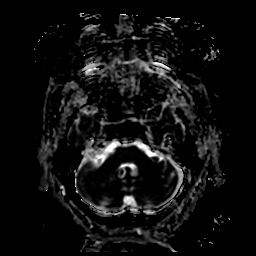
[im 25/49]
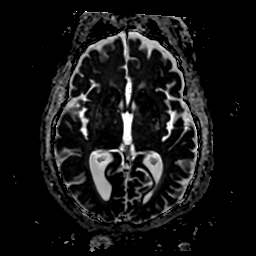
[im 37/49]
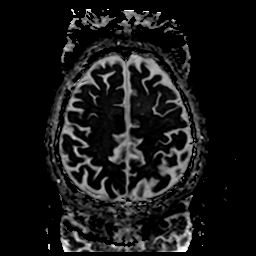
[im 49/49]
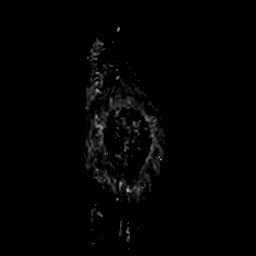

[Series 350: ADC · coronal · 4.0mm · 0.94mm/px · 3 of 36 slices shown (2 of 2)]
[im 1/36]
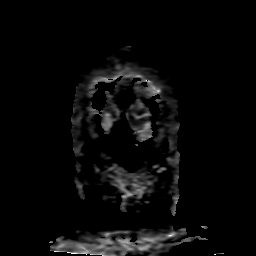
[im 18/36]
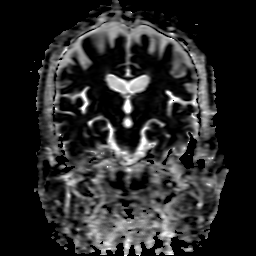
[im 36/36]
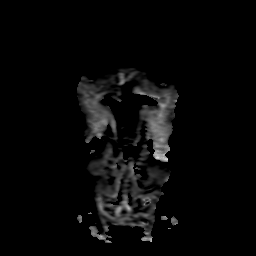

[28 of 48 positions shown; findings below may reference images not displayed]

FINDINGS: Brain: No restricted diffusion to suggest acute infarction. No
midline shift, mass effect, evidence of mass lesion,
ventriculomegaly, extra-axial collection or acute intracranial
hemorrhage. Cervicomedullary junction and pituitary are within
normal limits.

Multiple small chronic cerebellar infarcts are stable. Brainstem
remains normal. No encephalomalacia identified corresponding to the
small right caudate head lacune last year. Otherwise deep gray
nuclei remain normal. Moderate, scattered and patchy bilateral
cerebral white matter T2 and FLAIR hyperintensity is stable. No
cortical encephalomalacia or chronic cerebral blood products
identified.

Vascular: Major intracranial vascular flow voids are stable.
Dominant left vertebral artery.

Skull and upper cervical spine: Negative.

Sinuses/Orbits: Stable and negative orbits. Mild sphenoid sinus
mucosal thickening has minimally increased from last year.

Other: Mastoids remain well aerated. Visible internal auditory
structures appear normal. Negative visible scalp and face.
IMPRESSION: 1. No acute intracranial abnormality.
2. Stable chronic small-vessel ischemia in the bilateral cerebellum
and cerebral white matter.

## 2023-07-21 ENCOUNTER — Ambulatory Visit: Payer: Medicare Other

## 2023-07-21 VITALS — Ht 74.0 in | Wt 187.0 lb

## 2023-07-21 DIAGNOSIS — Z0000 Encounter for general adult medical examination without abnormal findings: Secondary | ICD-10-CM

## 2023-07-21 NOTE — Patient Instructions (Signed)
Fernando Walker , Thank you for taking time to come for your Medicare Wellness Visit. I appreciate your ongoing commitment to your health goals. Please review the following plan we discussed and let me know if I can assist you in the future.   Referrals/Orders/Follow-Ups/Clinician Recommendations: No  This is a list of the screening recommended for you and due dates:  Health Maintenance  Topic Date Due   Zoster (Shingles) Vaccine (1 of 2) Never done   Flu Shot  05/19/2023   COVID-19 Vaccine (4 - 2023-24 season) 06/19/2023   Medicare Annual Wellness Visit  07/20/2024   DTaP/Tdap/Td vaccine (2 - Td or Tdap) 04/26/2029   Pneumonia Vaccine  Completed   HPV Vaccine  Aged Out    Advanced directives: (Copy Requested) Please bring a copy of your health care power of attorney and living will to the office to be added to your chart at your convenience.  Next Medicare Annual Wellness Visit scheduled for next year: Yes

## 2023-07-21 NOTE — Progress Notes (Signed)
Subjective:   Fernando Walker is a 89 y.o. male who presents for Medicare Annual/Subsequent preventive examination.  Visit Complete: Virtual  I connected with  Nonah Mattes on 07/21/23 by a audio enabled telemedicine application and verified that I am speaking with the correct person using two identifiers.  Patient Location: Home  Provider Location: Office/Clinic  I discussed the limitations of evaluation and management by telemedicine. The patient expressed understanding and agreed to proceed.  Because this visit was a virtual/telehealth visit, some criteria may be missing or patient reported. Any vitals not documented were not able to be obtained and vitals that have been documented are patient reported.   Cardiac Risk Factors include: advanced age (>4men, >33 women);dyslipidemia;family history of premature cardiovascular disease     Objective:    Today's Vitals   07/21/23 1033  Weight: 187 lb (84.8 kg)  Height: 6\' 2"  (1.88 m)  PainSc: 0-No pain   Body mass index is 24.01 kg/m.     07/21/2023   10:40 AM 06/24/2022    3:49 PM 12/19/2020   12:02 PM 03/10/2020   11:55 AM 07/25/2019    9:47 AM 03/06/2019    1:41 PM 02/24/2018    1:29 PM  Advanced Directives  Does Patient Have a Medical Advance Directive? Yes Yes No Yes Yes Yes Yes  Type of Estate agent of [place];Living will Healthcare Power of [place];Living will   Healthcare Power of [place];Living will Healthcare Power of [place];Living will Healthcare Power of [place];Living will  Does patient want to make changes to medical advance directive?    No - Patient declined No - Patient declined    Copy of Healthcare Power of Attorney in Chart? No - copy requested No - copy requested   No - copy requested No - copy requested No - copy requested  Would patient like information on creating a medical advance directive?   No - Patient declined        Current Medications (verified) Outpatient  Encounter Medications as of 07/21/2023  Medication Sig   ELIQUIS 5 MG TABS tablet TAKE 1 TABLET BY MOUTH TWICE  DAILY   latanoprost (XALATAN) 0.005 % ophthalmic solution Place 1 drop into both eyes at bedtime.   levalbuterol (XOPENEX HFA) 45 MCG/ACT inhaler USE 2 INHALATIONS BY MOUTH IN  THE MORNING AND 2 INHALATIONS BY MOUTH AT NIGHT AS NEEDED FOR  WHEEZING   rosuvastatin (CRESTOR) 5 MG tablet TAKE 1 TABLET BY MOUTH DAILY   sildenafil (VIAGRA) 100 MG tablet Take 0.5-1 tablets (50-100 mg total) by mouth daily as needed for erectile dysfunction.   triamcinolone ointment (KENALOG) 0.1 % Apply 1 application topically 2 (two) times daily as needed (rash).   vitamin B-12 (CYANOCOBALAMIN) 1000 MCG tablet Take 1,000 mcg by mouth every evening.   No facility-administered encounter medications on file as of 07/21/2023.    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   Hyperlipidemia    Hypertension    Hypertrophy of prostate with urinary obstruction and other lower urinary tract symptoms (LUTS)    Pneumonia    As a child   Pneumonia    as child   Past Surgical History:  Procedure Laterality Date   cataracts     both eyes   COLONOSCOPY     colonoscopy with polypectomy  2004   CORONARY ARTERY BYPASS GRAFT  1995   X 7   FASCIECTOMY Left 11/29/2017   Procedure: SEGMENTAL FASCIECTOMY LEFT SMALL FINGER;  Surgeon: Cindee Salt, MD;  Location: [facility];  Service: Orthopedics;  Laterality: Left;  block and MAC   LEFT HEART CATH AND CORS/GRAFTS ANGIOGRAPHY N/A 07/25/2019   Procedure: LEFT HEART CATH AND CORS/GRAFTS ANGIOGRAPHY;  Surgeon: Kathleene Hazel, MD;   Location: [facility];  Service: Cardiovascular;  Laterality: N/A;   POLYPECTOMY     Family History  Problem Relation Age of Onset   Stroke Father 37   Diabetes Neg Hx    Cancer Neg Hx    GER disease Neg Hx    Heart attack Neg Hx    Colon cancer Neg Hx    Esophageal cancer Neg Hx    Stomach cancer Neg Hx    Heart disease Neg Hx    Social History   Socioeconomic History   Marital status: Widowed    Spouse name: Not on file   Number of children: 2   Years of education: 16   Highest education level: Bachelor's degree (e.g., BA, AB, BS)  Occupational History   Occupation: Retired  Tobacco Use   Smoking status: Former    Current packs/day: 0.00    Average packs/day: 4.0 packs/day for 30.0 years (120.0 ttl pk-yrs)    Types: Cigarettes    Start date: 10/18/1954    Quit date: 10/18/1984    Years since quitting: 38.7   Smokeless tobacco: Former  Building services engineer status: Never Used  Substance and Sexual Activity   Alcohol use: Yes    Alcohol/week: 12.0 standard drinks of alcohol    Types: 7 Glasses of wine, 5 Cans of beer per week   Drug use: No   Sexual activity: Not Currently  Other Topics Concern   Not on file  Social History Narrative   Lives alone   R handed   Caffeine: 2 C of coffee AM   Social Determinants of Health   Financial Resource Strain: Low Risk  (07/21/2023)   Overall Financial Resource Strain (CARDIA)    Difficulty of Paying Living Expenses: Not hard at all  Food Insecurity: No Food Insecurity (07/21/2023)   Hunger Vital Sign    Worried About Running Out of Food in the Last Year: Never true    Ran Out of Food in the Last Year: Never true  Transportation Needs: No Transportation Needs (07/21/2023)   PRAPARE - Administrator, Civil Service (Medical): No    Lack of Transportation (Non-Medical): No  Physical Activity: Sufficiently Active (07/21/2023)   Exercise Vital Sign    Days of Exercise per Week: 5 days    Minutes of Exercise per  Session: 30 min  Stress: No Stress Concern Present (07/21/2023)   [employer] of Occupational Health - Occupational Stress Questionnaire    Feeling of Stress : Not at all  Social Connections: Moderately Integrated (07/21/2023)   Social Connection and Isolation Panel [NHANES]    Frequency of Communication with Friends and Family: More than three times a week    Frequency of Social Gatherings with Friends and Family: More than three times a week    Attends Religious Services: More than 4 times per year    Active Member of Clubs or  Organizations: Yes    Attends Engineer, structural: More than 4 times per year    Marital Status: Widowed    Tobacco Counseling Counseling given: Not Answered   Clinical Intake:  Pre-visit preparation completed: Yes  Pain : No/denies pain Pain Score: 0-No pain     BMI - recorded: 24.01 Nutritional Status: BMI of 19-24  Normal Nutritional Risks: None Diabetes: No  How often do you need to have someone help you when you read instructions, pamphlets, or other written materials from your doctor or pharmacy?: 1 - Never What is the last grade level you completed in school?: College Graduate [facility])  Interpreter Needed?: No  Information entered by :: [employer]. Amiyrah Lamere, LPN.   Activities of Daily Living    07/21/2023   10:45 AM  In your present state of health, do you have any difficulty performing the following activities:  Hearing? 0  Vision? 0  Difficulty concentrating or making decisions? 0  Walking or climbing stairs? 0  Dressing or bathing? 0  Doing errands, shopping? 0  Preparing Food and eating ? N  Using the Toilet? N  In the past six months, have you accidently leaked urine? N  Do you have problems with loss of bowel control? N  Managing your Medications? N  Managing your Finances? N  Housekeeping or managing your Housekeeping? N    Patient Care Team: Pincus Sanes, MD as PCP - General (Internal  Medicine) Kathleene Hazel, MD as PCP - Cardiology (Cardiology)  Indicate any recent Medical Services you may have received from other than [facility] providers in the past year (date may be approximate).     Assessment:   This is a routine wellness examination for Tyke.  Hearing/Vision screen Hearing Screening - Comments:: Patient has hearing difficulty and wears hearing aids. Vision Screening - Comments:: Patient does wear corrective lenses/contacts.  Annual eye exam done by: Jethro Bolus, MD.    Goals Addressed   None   Depression Screen    07/21/2023   10:44 AM 03/04/2023    2:18 PM 06/24/2022    3:36 PM 02/27/2021    3:17 PM 03/10/2020   11:56 AM 01/15/2020    3:18 PM 08/06/2019   12:08 PM  PHQ 2/9 Scores  PHQ - 2 Score 0 0 0 0   5  PHQ- 9 Score 0 2     9  Exception Documentation     Other- indicate reason in comment box Other- indicate reason in comment box   Not completed     Patient is still grieving his wife; he is on treatment. pt just lost his wife a few days ago, he is grieving     Fall Risk    07/21/2023   10:41 AM 03/04/2023    2:18 PM 06/24/2022    3:51 PM 06/24/2022    3:37 PM 07/31/2021    3:31 PM  Fall Risk   Falls in the past year? 0 0 0 0 0  Number falls in past yr: 0 0 0 0 0  Injury with Fall? 0 0 0 0 0  Risk for fall due to : No Fall Risks No Fall Risks No Fall Risks No Fall Risks No Fall Risks  Follow up Falls prevention discussed Falls evaluation completed Falls prevention discussed Falls prevention discussed Falls evaluation completed    MEDICARE RISK AT HOME: Medicare Risk at Home Any stairs in or around the home?: Yes If so, are there any without  handrails?: No Home free of loose throw rugs in walkways, pet beds, electrical cords, etc?: Yes Adequate lighting in your home to reduce risk of falls?: Yes Life alert?: No Use of a cane, walker or w/c?: No Grab bars in the bathroom?: Yes Shower chair or bench in shower?: Yes Elevated toilet seat  or a handicapped toilet?: Yes  TIMED UP AND GO:  Was the test performed?  No    Cognitive Function:    02/24/2018    1:43 PM 06/08/2016   11:30 AM  MMSE - Mini Mental State Exam  Not completed:  --  Orientation to time 5   Orientation to Place 5   Registration 3   Attention/ Calculation 5   Recall 1   Language- name 2 objects 2   Language- repeat 1   Language- follow 3 step command 3   Language- read & follow direction 1   Write a sentence 1   Copy design 1   Total score 28         07/21/2023   10:42 AM 06/24/2022    3:37 PM  6CIT Screen  What Year? 0 points 0 points  What month? 0 points 0 points  What time? 0 points 0 points  Count back from 20 0 points 0 points  Months in reverse 0 points 0 points  Repeat phrase 0 points 0 points  Total Score 0 points 0 points    Immunizations Immunization History  Administered Date(s) Administered   Fluad Quad(high Dose 65+) 07/10/2019, 08/01/2020, 07/22/2021   Influenza Split 07/27/2011, 08/03/2012   Influenza Whole 09/07/2007, 07/24/2008, 07/23/2009, 08/07/2010   Influenza, High Dose Seasonal PF 08/01/2013, 07/20/2017, 07/04/2018   Influenza,inj,Quad PF,6+ Mos 06/26/2014, 08/04/2015, 08/19/2022   PFIZER(Purple Top)SARS-COV-2 Vaccination 11/09/2019, 12/09/2019, 08/30/2020   PNEUMOCOCCAL CONJUGATE-20 08/19/2022   Pneumococcal Conjugate-13 11/03/2015   Pneumococcal Polysaccharide-23 09/13/2012   Tdap 04/27/2019    TDAP status: Up to date  Flu Vaccine status: Due, Education has been provided regarding the importance of this vaccine. Advised may receive this vaccine at local pharmacy or Health Dept. Aware to provide a copy of the vaccination record if obtained from local pharmacy or Health Dept. Verbalized acceptance and understanding.  Pneumococcal vaccine status: Up to date  Covid-19 vaccine status: Completed vaccines  Qualifies for Shingles Vaccine? Yes   Zostavax completed No   Shingrix Completed?: No.    Education  has been provided regarding the importance of this vaccine. Patient has been advised to call insurance company to determine out of pocket expense if they have not yet received this vaccine. Advised may also receive vaccine at local pharmacy or Health Dept. Verbalized acceptance and understanding.  Screening Tests Health Maintenance  Topic Date Due   Zoster Vaccines- Shingrix (1 of 2) Never done   INFLUENZA VACCINE  05/19/2023   COVID-19 Vaccine (4 - 2023-24 season) 06/19/2023   Medicare Annual Wellness (AWV)  07/20/2024   DTaP/Tdap/Td (2 - Td or Tdap) 04/26/2029   Pneumonia Vaccine 18+ Years old  Completed   HPV VACCINES  Aged Out    Health Maintenance  Health Maintenance Due  Topic Date Due   Zoster Vaccines- Shingrix (1 of 2) Never done   INFLUENZA VACCINE  05/19/2023   COVID-19 Vaccine (4 - 2023-24 season) 06/19/2023    Colorectal cancer screening: No longer required.   Lung Cancer Screening: (Low Dose CT Chest recommended if Age 29-80 years, 20 pack-year currently smoking OR have quit w/in 15years.) does not qualify.  Lung Cancer Screening Referral: no  Additional Screening:  Hepatitis C Screening: does not qualify; Completed no  Vision Screening: Recommended annual ophthalmology exams for early detection of glaucoma and other disorders of the eye. Is the patient up to date with their annual eye exam?  Yes  Who is the provider or what is the name of the office in which the patient attends annual eye exams? [facility] If pt is not established with a provider, would they like to be referred to a provider to establish care? No .   Dental Screening: Recommended annual dental exams for proper oral hygiene  Diabetic Foot Exam: N/A  Community Resource Referral / Chronic Care Management: CRR required this visit?  No   CCM required this visit?  No     Plan:     I have personally reviewed and noted the following in the patient's chart:    Medical and social history Use of alcohol, tobacco or illicit drugs  Current medications and supplements including opioid prescriptions. Patient is not currently taking opioid prescriptions. Functional ability and status Nutritional status Physical activity Advanced directives List of other physicians Hospitalizations, surgeries, and ER visits in previous 12 months Vitals Screenings to include cognitive, depression, and falls Referrals and appointments  In addition, I have reviewed and discussed with patient certain preventive protocols, quality metrics, and best practice recommendations. A written personalized care plan for preventive services as well as general preventive health recommendations were provided to patient.     Mickeal Needy, LPN   16/10/958   After Visit Summary: (MyChart) Due to this being a telephonic visit, the after visit summary with patients personalized plan was offered to patient via MyChart   Nurse Notes: Normal cognitive status assessed by direct observation via telephone conversation by this Nurse Health Advisor. No abnormalities found.

## 2023-07-25 ENCOUNTER — Ambulatory Visit (INDEPENDENT_AMBULATORY_CARE_PROVIDER_SITE_OTHER): Payer: Medicare Other

## 2023-07-25 DIAGNOSIS — Z23 Encounter for immunization: Secondary | ICD-10-CM | POA: Diagnosis not present

## 2023-07-25 NOTE — Progress Notes (Signed)
Pt received HD flu w/o complications

## 2023-08-07 ENCOUNTER — Other Ambulatory Visit: Payer: Self-pay | Admitting: Cardiovascular Disease

## 2023-08-07 DIAGNOSIS — I480 Paroxysmal atrial fibrillation: Secondary | ICD-10-CM

## 2023-08-08 NOTE — Telephone Encounter (Signed)
Prescription refill request for Eliquis received. Indication:afib Last office visit:6/24 AOZ:HYQMV labs Age: 89 Weight:84.8  kg  Prescription refilled

## 2023-08-09 DIAGNOSIS — L821 Other seborrheic keratosis: Secondary | ICD-10-CM | POA: Diagnosis not present

## 2023-08-09 DIAGNOSIS — L308 Other specified dermatitis: Secondary | ICD-10-CM | POA: Diagnosis not present

## 2023-08-09 DIAGNOSIS — Z85828 Personal history of other malignant neoplasm of skin: Secondary | ICD-10-CM | POA: Diagnosis not present

## 2023-08-12 ENCOUNTER — Ambulatory Visit: Payer: Medicare Other | Admitting: Cardiovascular Disease

## 2023-09-08 ENCOUNTER — Telehealth: Payer: Self-pay | Admitting: Cardiovascular Disease

## 2023-09-08 ENCOUNTER — Other Ambulatory Visit: Payer: Self-pay | Admitting: Cardiovascular Disease

## 2023-09-08 DIAGNOSIS — I480 Paroxysmal atrial fibrillation: Secondary | ICD-10-CM

## 2023-09-08 MED ORDER — APIXABAN 5 MG PO TABS: 5.0000 mg | ORAL_TABLET | Freq: Two times a day (BID) | ORAL | 0 refills | Status: DC

## 2023-09-08 NOTE — Telephone Encounter (Signed)
Pt called and reports that he spoke with [facility] and they reported that he is in the "donut hole"... [facility] only sent him a 30 day supply and he was very upset... they advised him that he needs a new RX sent in.. I will send to our Pharm D to be sure refilling RX is appropriate strength and can approve.

## 2023-09-08 NOTE — Telephone Encounter (Signed)
Prescription refill request for Eliquis received. Indication: Afib  Last office visit: 03/22/23 Fernando Walker)  Scr: 1.10 (10/23/21)  Age: 89 Weight: 84.8kg  Labs overdue. Called and spoke with pt. Made him aware labs are overdue. Pt states he will go next week to [facility] lab to have labs drawn. Lab orders placed. Refill sent.

## 2023-09-08 NOTE — Telephone Encounter (Signed)
Pt c/o medication issue:  1. Name of Medication: apixaban (ELIQUIS) 5 MG TABS tablet   2. How are you currently taking this medication (dosage and times per day)?   3. Are you having a reaction (difficulty breathing--STAT)?   4. What is your medication issue? Patient states that he would like a call back to discuss this medication and the cost.   Patient states if return call is tomorrow 11/22 to please call before noon.

## 2023-09-09 NOTE — Telephone Encounter (Signed)
Duplicate request. Last sent on 09/08/23 for 90 day supply to same pharmacy. Will not resend.

## 2023-09-13 DIAGNOSIS — I480 Paroxysmal atrial fibrillation: Secondary | ICD-10-CM | POA: Diagnosis not present

## 2023-09-14 LAB — CBC WITH DIFFERENTIAL/PLATELET
Basophils Absolute: 0.1 10*3/uL (ref 0.0–0.2)
Basos: 1 %
EOS (ABSOLUTE): 0.3 10*3/uL (ref 0.0–0.4)
Eos: 4 %
Hematocrit: 44.9 % (ref 37.5–51.0)
Hemoglobin: 14.4 g/dL (ref 13.0–17.7)
Immature Grans (Abs): 0.1 10*3/uL (ref 0.0–0.1)
Immature Granulocytes: 1 %
Lymphocytes Absolute: 1.3 10*3/uL (ref 0.7–3.1)
Lymphs: 16 %
MCH: 29.8 pg (ref 26.6–33.0)
MCHC: 32.1 g/dL (ref 31.5–35.7)
MCV: 93 fL (ref 79–97)
Monocytes Absolute: 0.8 10*3/uL (ref 0.1–0.9)
Monocytes: 10 %
Neutrophils Absolute: 5.7 10*3/uL (ref 1.4–7.0)
Neutrophils: 68 %
Platelets: 267 10*3/uL (ref 150–450)
RBC: 4.84 x10E6/uL (ref 4.14–5.80)
RDW: 12.8 % (ref 11.6–15.4)
WBC: 8.3 10*3/uL (ref 3.4–10.8)

## 2023-09-14 LAB — BASIC METABOLIC PANEL
BUN/Creatinine Ratio: 9 — ABNORMAL LOW (ref 10–24)
BUN: 10 mg/dL (ref 8–27)
CO2: 27 mmol/L (ref 20–29)
Calcium: 9.1 mg/dL (ref 8.6–10.2)
Chloride: 103 mmol/L (ref 96–106)
Creatinine, Ser: 1.11 mg/dL (ref 0.76–1.27)
Glucose: 109 mg/dL — ABNORMAL HIGH (ref 70–99)
Potassium: 4.1 mmol/L (ref 3.5–5.2)
Sodium: 139 mmol/L (ref 134–144)
eGFR: 64 mL/min/{1.73_m2} (ref >59)

## 2023-10-18 DIAGNOSIS — Z85828 Personal history of other malignant neoplasm of skin: Secondary | ICD-10-CM | POA: Diagnosis not present

## 2023-10-18 DIAGNOSIS — D0472 Carcinoma in situ of skin of left lower limb, including hip: Secondary | ICD-10-CM | POA: Diagnosis not present

## 2023-10-18 DIAGNOSIS — D485 Neoplasm of uncertain behavior of skin: Secondary | ICD-10-CM | POA: Diagnosis not present

## 2023-10-31 DIAGNOSIS — H401134 Primary open-angle glaucoma, bilateral, indeterminate stage: Secondary | ICD-10-CM | POA: Diagnosis not present

## 2023-12-13 ENCOUNTER — Other Ambulatory Visit: Payer: Self-pay | Admitting: Cardiovascular Disease

## 2023-12-13 DIAGNOSIS — I480 Paroxysmal atrial fibrillation: Secondary | ICD-10-CM

## 2023-12-13 NOTE — Telephone Encounter (Signed)
 Prescription refill request for Eliquis received. Indication:afib Last office visit:6/24 Scr:1.11  11/24 Age: 89 Weight:84.8  kg  Prescription refilled

## 2024-02-15 DIAGNOSIS — Z85828 Personal history of other malignant neoplasm of skin: Secondary | ICD-10-CM | POA: Diagnosis not present

## 2024-02-15 DIAGNOSIS — L905 Scar conditions and fibrosis of skin: Secondary | ICD-10-CM | POA: Diagnosis not present

## 2024-04-11 DIAGNOSIS — H401134 Primary open-angle glaucoma, bilateral, indeterminate stage: Secondary | ICD-10-CM | POA: Diagnosis not present

## 2024-05-06 ENCOUNTER — Other Ambulatory Visit: Payer: Self-pay | Admitting: Cardiovascular Disease

## 2024-05-08 ENCOUNTER — Other Ambulatory Visit: Payer: Self-pay | Admitting: Internal Medicine

## 2024-05-14 ENCOUNTER — Telehealth: Payer: Self-pay | Admitting: Internal Medicine

## 2024-05-14 NOTE — Telephone Encounter (Signed)
 Patient dropped off document senior community paperwork, to be filled out by provider. Patient requested to send it back via Fax within 7-days. Document is located in providers tray at front office.Please advise at Mobile [phone] (mobile)

## 2024-05-14 NOTE — Telephone Encounter (Signed)
 Placed in Dr. Geofm blue folder to be signed

## 2024-05-15 NOTE — Telephone Encounter (Signed)
 Form faxed today

## 2024-05-23 NOTE — Progress Notes (Unsigned)
 No chief complaint on file.  History of Present Illness: 89 yo male with history of CAD, paroxysmal atrial fibrillation, mild to moderate aortic stenosis, HTN, piror CVA and hyperlipidemia who is here today for cardiac follow up. He underwent 7V CABG in 1995. Mild aortic stenosis noted on echo in 2019. He was seen in our office September 2020 with c/o chest tightness. Cardiac cath October 2020 with 6/7 patent bypass grafts. The distal limb of the SVG to the RV marginal was chronically occluded. Echo in 2020 with normal LVEF and mild AS. Cardiac monitor October 2020 showed atrial fibrillation with longest episode 1 hour. Stroke beginning of March 2022. MRI with 5 mm caudate infarct. Echo 12/19/20 with LVEF=60-65%. Trivial MR. Mild to moderate AS. Carotid artery dopplers March 2022 with mild bilateral carotid artery disease. Most recent echo June 2024 with LVEF=60-65%. Mild to moderate aortic stenosis with mean gradient 21 mmHg. 8.8 mmHg, AVA 1.2 cm2.   He is here today for follow up. The patient denies any chest pain, dyspnea, palpitations, lower extremity edema, orthopnea, PND, dizziness, near syncope or syncope.    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   Hyperlipidemia    Hypertension    Hypertrophy of prostate with urinary obstruction and other lower urinary tract symptoms (LUTS)    Pneumonia    As a child   Pneumonia    as child    Past Surgical History:  Procedure Laterality Date   cataracts     both eyes   COLONOSCOPY     colonoscopy with polypectomy  2004   CORONARY ARTERY BYPASS GRAFT  1995   X 7   FASCIECTOMY Left 11/29/2017   Procedure: SEGMENTAL FASCIECTOMY LEFT SMALL FINGER;  Surgeon: Murrell Kuba, MD;  Location: [facility];  Service: Orthopedics;  Laterality: Left;  block and MAC   LEFT HEART CATH AND CORS/GRAFTS ANGIOGRAPHY N/A 07/25/2019   Procedure: LEFT HEART CATH AND CORS/GRAFTS ANGIOGRAPHY;  Surgeon: Verlin Lonni BIRCH, MD;  Location: [facility];  Service: Cardiovascular;  Laterality: N/A;   POLYPECTOMY      Current Outpatient Medications  Medication Sig Dispense Refill   ELIQUIS  5 MG TABS tablet TAKE 1 TABLET BY MOUTH TWICE  DAILY 180 tablet 3   latanoprost  (XALATAN ) 0.005 % ophthalmic solution Place 1 drop into both eyes at bedtime.     levalbuterol  (XOPENEX  HFA) 45 MCG/ACT inhaler USE 2 INHALATIONS BY MOUTH IN  THE MORNING AND 2 INHALATIONS BY MOUTH AT NIGHT AS NEEDED FOR  WHEEZING 30 g 3   rosuvastatin  (CRESTOR ) 5 MG tablet TAKE 1 TABLET BY MOUTH DAILY 30 tablet 0   sildenafil  (VIAGRA ) 100 MG tablet Take 0.5-1 tablets (50-100 mg total) by mouth daily as needed for erectile dysfunction. 5 tablet 11   triamcinolone ointment (KENALOG) 0.1 % Apply 1 application topically 2 (two) times daily as needed (rash).     vitamin B-12 (CYANOCOBALAMIN) 1000 MCG tablet Take 1,000 mcg by mouth every evening.     No current facility-administered medications for this visit.    Allergies  Allergen Reactions   Sulfonamide Derivatives Rash   Spiriva  Respimat [Tiotropium Bromide  Monohydrate] Other (See Comments)    pharyngitis  Social History   Socioeconomic History   Marital status: Widowed    Spouse name: Not on file   Number of children: 2   Years of education: 16   Highest education level: Bachelor's degree (e.g., BA, AB, BS)  Occupational History   Occupation: Retired  Tobacco Use   Smoking status: Former    Current packs/day: 0.00    Average packs/day: 4.0 packs/day for 30.0 years (120.0 ttl pk-yrs)    Types: Cigarettes    Start date: 10/18/1954    Quit date: 10/18/1984    Years since quitting: 39.6   Smokeless tobacco: Former  Building services engineer status: Never Used   Substance and Sexual Activity   Alcohol use: Yes    Alcohol/week: 12.0 standard drinks of alcohol    Types: 7 Glasses of wine, 5 Cans of beer per week   Drug use: No   Sexual activity: Not Currently  Other Topics Concern   Not on file  Social History Narrative   Lives alone   R handed   Caffeine: 2 C of coffee AM   Social Drivers of Corporate investment banker Strain: Low Risk  (07/21/2023)   Overall Financial Resource Strain (CARDIA)    Difficulty of Paying Living Expenses: Not hard at all  Food Insecurity: No Food Insecurity (07/21/2023)   Hunger Vital Sign    Worried About Running Out of Food in the Last Year: Never true    Ran Out of Food in the Last Year: Never true  Transportation Needs: No Transportation Needs (07/21/2023)   PRAPARE - Administrator, Civil Service (Medical): No    Lack of Transportation (Non-Medical): No  Physical Activity: Sufficiently Active (07/21/2023)   Exercise Vital Sign    Days of Exercise per Week: 5 days    Minutes of Exercise per Session: 30 min  Stress: No Stress Concern Present (07/21/2023)   [employer] of Occupational Health - Occupational Stress Questionnaire    Feeling of Stress : Not at all  Social Connections: Moderately Integrated (07/21/2023)   Social Connection and Isolation Panel    Frequency of Communication with Friends and Family: More than three times a week    Frequency of Social Gatherings with Friends and Family: More than three times a week    Attends Religious Services: More than 4 times per year    Active Member of [employer] or Organizations: Yes    Attends Banker Meetings: More than 4 times per year    Marital Status: Widowed  Intimate Partner Violence: Not At Risk (07/21/2023)   Humiliation, Afraid, Rape, and Kick questionnaire    Fear of Current or Ex-Partner: No    Emotionally Abused: No    Physically Abused: No    Sexually Abused: No    Family History  Problem Relation Age of Onset    Stroke Father 78   Diabetes Neg Hx    Cancer Neg Hx    GER disease Neg Hx    Heart attack Neg Hx    Colon cancer Neg Hx    Esophageal cancer Neg Hx    Stomach cancer Neg Hx    Heart disease Neg Hx     Review of Systems:  As stated in the HPI and otherwise negative.   There were no vitals taken for this visit.  Physical Examination: General: Well developed, well nourished, NAD  HEENT: OP clear, mucus membranes moist  SKIN: warm, dry. No rashes. Neuro: No  focal deficits  Musculoskeletal: Muscle strength 5/5 all ext  Psychiatric: Mood and affect normal  Neck: No JVD, no carotid bruits, no thyromegaly, no lymphadenopathy.  Lungs:Clear bilaterally, no wheezes, rhonci, crackles Cardiovascular: Regular rate and rhythm. No murmurs, gallops or rubs. Abdomen:Soft. Bowel sounds present. Non-tender.  Extremities: No lower extremity edema. Pulses are 2 + in the bilateral DP/PT.  EKG:  EKG is *** ordered today. The ekg ordered today demonstrates   Recent Labs: 09/13/2023: BUN 10; Creatinine, Ser 1.11; Hemoglobin 14.4; Platelets 267; Potassium 4.1; Sodium 139   Lipid Panel    Component Value Date/Time   CHOL 129 08/13/2022 0922   TRIG 59 08/13/2022 0922   HDL 62 08/13/2022 0922   CHOLHDL 2.1 08/13/2022 0922   CHOLHDL 2.2 12/18/2020 1839   VLDL 12 12/18/2020 1839   LDLCALC 54 08/13/2022 0922     Wt Readings from Last 3 Encounters:  07/21/23 187 lb (84.8 kg)  06/08/23 190 lb (86.2 kg)  03/22/23 194 lb 12.8 oz (88.4 kg)    Assessment and Plan:   1. CAD s/p CABG without angina: No chest pain. Last cardiac cath in October 2020 with stable CAD. He does not wish to take a beta blocker. He is not on ASA since he is on Eliquis . Continue Crestor   2. HTN: BP is well controlled. Continue current therapy  3. Hyperlipidemia: LDL ***. Continue Crestor .  at goal in October 2023. Continue Crestor .    4. Carotid artery disease: He has mild bilateral carotid artery disease by dopplers in  November 2017 and then again March 2022. No plans to repeat given age and stability of disease over time.   5. Aortic stenosis: Moderate AS by echo June 2024. Repeat echo now.    6. Atrial fibrillation, paroxysmal:  Sinus today. CHADS VASC score is 4. He does not wish to take a beta blocker.  Continue Eliquis .   Labs/ tests ordered today include:  No orders of the defined types were placed in this encounter.  Disposition: Follow up with me in 12 months.   Signed, Lonni Cash, MD 05/23/2024 3:24 PM    [facility] [address] [place], [place], [place]  [address] Phone: [phone]; Fax: [phone]

## 2024-05-24 ENCOUNTER — Ambulatory Visit: Attending: Cardiovascular Disease | Admitting: Cardiovascular Disease

## 2024-05-24 ENCOUNTER — Encounter: Payer: Self-pay | Admitting: Cardiovascular Disease

## 2024-05-24 VITALS — BP 94/63 | HR 64 | Resp 16 | Ht 74.0 in | Wt 194.2 lb

## 2024-05-24 DIAGNOSIS — I480 Paroxysmal atrial fibrillation: Secondary | ICD-10-CM | POA: Diagnosis not present

## 2024-05-24 DIAGNOSIS — I350 Nonrheumatic aortic (valve) stenosis: Secondary | ICD-10-CM | POA: Diagnosis not present

## 2024-05-24 DIAGNOSIS — I10 Essential (primary) hypertension: Secondary | ICD-10-CM | POA: Insufficient documentation

## 2024-05-24 DIAGNOSIS — I2510 Atherosclerotic heart disease of native coronary artery without angina pectoris: Secondary | ICD-10-CM | POA: Insufficient documentation

## 2024-05-24 DIAGNOSIS — E785 Hyperlipidemia, unspecified: Secondary | ICD-10-CM | POA: Diagnosis not present

## 2024-05-24 MED ORDER — ROSUVASTATIN CALCIUM 5 MG PO TABS: 5.0000 mg | ORAL_TABLET | Freq: Every day | ORAL | 3 refills | Status: AC

## 2024-05-24 NOTE — Patient Instructions (Signed)
 Medication Instructions:  No changes *If you need a refill on your cardiac medications before your next appointment, please call your pharmacy*  Lab Work: none If you have labs (blood work) drawn today and your tests are completely normal, you will receive your results only by: MyChart Message (if you have MyChart) OR A paper copy in the mail If you have any lab test that is abnormal or we need to change your treatment, we will call you to review the results.  Testing/Procedures: Your physician has requested that you have an echocardiogram. Echocardiography is a painless test that uses sound waves to create images of your heart. It provides your doctor with information about the size and shape of your heart and how well your heart's chambers and valves are working. This procedure takes approximately one hour. There are no restrictions for this procedure. Please do NOT wear cologne, perfume, aftershave, or lotions (deodorant is allowed). Please arrive 15 minutes prior to your appointment time.  Please note: We ask at that you not bring children with you during ultrasound (echo/ vascular) testing. Due to room size and safety concerns, children are not allowed in the ultrasound rooms during exams. Our front office staff cannot provide observation of children in our lobby area while testing is being conducted. An adult accompanying a patient to their appointment will only be allowed in the ultrasound room at the discretion of the ultrasound technician under special circumstances. We apologize for any inconvenience.   Follow-Up: At [facility], you and your health needs are our priority.  As part of our continuing mission to provide you with exceptional heart care, our providers are all part of one team.  This team includes your primary Cardiologist (physician) and Advanced Practice Providers or APPs (Physician Assistants and Nurse Practitioners) who all work together to provide you with the  care you need, when you need it.  Your next appointment:   12 month(s)  Provider:   Antoinette Batman, MD

## 2024-07-03 ENCOUNTER — Ambulatory Visit (HOSPITAL_COMMUNITY)
Admission: RE | Admit: 2024-07-03 | Discharge: 2024-07-03 | Disposition: A | Discharge status: Home / Self Care | Source: Ambulatory Visit | Attending: Cardiovascular Disease | Admitting: Cardiovascular Disease

## 2024-07-03 DIAGNOSIS — I350 Nonrheumatic aortic (valve) stenosis: Secondary | ICD-10-CM | POA: Insufficient documentation

## 2024-07-03 LAB — ECHOCARDIOGRAM COMPLETE
AR max vel: 1.1 cm2
AV Area VTI: 1.01 cm2
AV Area mean vel: 1.05 cm2
AV Mean grad: 36 mmHg
AV Peak grad: 58.1 mmHg
Ao pk vel: 3.81 m/s
Area-P 1/2: 1.85 cm2
P 1/2 time: 499 ms
S' Lateral: 2.9 cm

## 2024-07-08 ENCOUNTER — Ambulatory Visit: Payer: Self-pay | Admitting: Cardiovascular Disease

## 2024-07-08 DIAGNOSIS — I350 Nonrheumatic aortic (valve) stenosis: Secondary | ICD-10-CM

## 2024-07-23 ENCOUNTER — Ambulatory Visit (INDEPENDENT_AMBULATORY_CARE_PROVIDER_SITE_OTHER): Payer: Medicare Other

## 2024-07-23 VITALS — Ht 73.5 in | Wt 190.0 lb

## 2024-07-23 DIAGNOSIS — Z0000 Encounter for general adult medical examination without abnormal findings: Secondary | ICD-10-CM | POA: Diagnosis not present

## 2024-07-23 NOTE — Patient Instructions (Addendum)
 [name],  Thank you for taking the time for your Medicare Wellness Visit. I appreciate your continued commitment to your health goals. Please review the care plan we discussed, and feel free to reach out if I can assist you further.  Medicare recommends these wellness visits once per year to help you and your care team stay ahead of potential health issues. These visits are designed to focus on prevention, allowing your provider to concentrate on managing your acute and chronic conditions during your regular appointments.  Please note that Annual Wellness Visits do not include a physical exam. Some assessments may be limited, especially if the visit was conducted virtually. If needed, we may recommend a separate in-person follow-up with your provider.  Ongoing Care Seeing your primary care provider every 3 to 6 months helps us  monitor your health and provide consistent, personalized care.   Referrals If a referral was made during today's visit and you haven't received any updates within two weeks, please contact the referred provider directly to check on the status.  Recommended Screenings:  Health Maintenance  Topic Date Due   Zoster (Shingles) Vaccine (1 of 2) Never done   Flu Shot  05/18/2024   COVID-19 Vaccine (4 - 2025-26 season) 06/18/2024   Medicare Annual Wellness Visit  07/23/2025   DTaP/Tdap/Td vaccine (2 - Td or Tdap) 04/26/2029   Pneumococcal Vaccine for age over 20  Completed   Meningitis B Vaccine  Aged Out       07/23/2024   10:50 AM  Advanced Directives  Does Patient Have a Medical Advance Directive? Yes  Type of Estate agent of [place];Living will  Copy of Healthcare Power of Attorney in Chart? No - copy requested   Advance Care Planning is important because it: Ensures you receive medical care that aligns with your values, goals, and preferences. Provides guidance to your family and loved ones, reducing the emotional burden of  decision-making during critical moments.  Vision: Annual vision screenings are recommended for early detection of glaucoma, cataracts, and diabetic retinopathy. These exams can also reveal signs of chronic conditions such as diabetes and high blood pressure.  Dental: Annual dental screenings help detect early signs of oral cancer, gum disease, and other conditions linked to overall health, including heart disease and diabetes.

## 2024-07-23 NOTE — Progress Notes (Signed)
 Subjective:   Fernando Walker is a 89 y.o. who presents for a Medicare Wellness preventive visit.  As a reminder, Annual Wellness Visits don't include a physical exam, and some assessments may be limited, especially if this visit is performed virtually. We may recommend an in-person follow-up visit with your provider if needed.  Visit Complete: Virtual I connected with  [name] on 07/23/24 by a audio enabled telemedicine application and verified that I am speaking with the correct person using two identifiers.  Patient Location: Home  Provider Location: Office/Clinic  I discussed the limitations of evaluation and management by telemedicine. The patient expressed understanding and agreed to proceed.  Vital Signs: Because this visit was a virtual/telehealth visit, some criteria may be missing or patient reported. Any vitals not documented were not able to be obtained and vitals that have been documented are patient reported.  VideoDeclined- This patient declined Librarian, academic. Therefore the visit was completed with audio only.  Persons Participating in Visit: Patient.  AWV Questionnaire: No: Patient Medicare AWV questionnaire was not completed prior to this visit.  Cardiac Risk Factors include: advanced age (>37men, >37 women);dyslipidemia;male gender;Other (see comment), Risk factor comments: A. Fib; CAD     Objective:    Today's Vitals   07/23/24 1050  Weight: 190 lb (86.2 kg)  Height: 6' 1.5 (1.867 m)   Body mass index is 24.73 kg/m.     07/23/2024   10:50 AM 07/21/2023   10:40 AM 06/24/2022    3:49 PM 12/19/2020   12:02 PM 03/10/2020   11:55 AM 07/25/2019    9:47 AM 03/06/2019    1:41 PM  Advanced Directives  Does Patient Have a Medical Advance Directive? Yes Yes Yes No Yes Yes Yes  Type of Estate agent of [place];Living will Healthcare Power of [place];Living will Healthcare Power of [place];Living  will   Healthcare Power of [place];Living will Healthcare Power of [place];Living will  Does patient want to make changes to medical advance directive?     No - Patient declined No - Patient declined   Copy of Healthcare Power of Attorney in Chart? No - copy requested No - copy requested No - copy requested   No - copy requested No - copy requested   Would patient like information on creating a medical advance directive?    No - Patient declined        Data saved with a previous flowsheet row definition    Current Medications (verified) Outpatient Encounter Medications as of 07/23/2024  Medication Sig   ELIQUIS  5 MG TABS tablet TAKE 1 TABLET BY MOUTH TWICE  DAILY   latanoprost  (XALATAN ) 0.005 % ophthalmic solution Place 1 drop into both eyes at bedtime.   levalbuterol  (XOPENEX  HFA) 45 MCG/ACT inhaler USE 2 INHALATIONS BY MOUTH IN  THE MORNING AND 2 INHALATIONS BY MOUTH AT NIGHT AS NEEDED FOR  WHEEZING   rosuvastatin  (CRESTOR ) 5 MG tablet Take 1 tablet (5 mg total) by mouth daily.   sildenafil  (VIAGRA ) 100 MG tablet Take 0.5-1 tablets (50-100 mg total) by mouth daily as needed for erectile dysfunction.   triamcinolone ointment (KENALOG) 0.1 % Apply 1 application topically 2 (two) times daily as needed (rash).   vitamin B-12 (CYANOCOBALAMIN) 1000 MCG tablet Take 1,000 mcg by mouth every evening.   No facility-administered encounter medications on file as of 07/23/2024.    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   Hyperlipidemia    Hypertension    Hypertrophy of prostate with urinary obstruction and other lower urinary tract symptoms (LUTS)    Pneumonia    As a child    Pneumonia    as child   Past Surgical History:  Procedure Laterality Date   cataracts     both eyes   COLONOSCOPY     colonoscopy with polypectomy  2004   CORONARY ARTERY BYPASS GRAFT  1995   X 7   FASCIECTOMY Left 11/29/2017   Procedure: SEGMENTAL FASCIECTOMY LEFT SMALL FINGER;  Surgeon: Murrell Kuba, MD;  Location: [facility];  Service: Orthopedics;  Laterality: Left;  block and MAC   LEFT HEART CATH AND CORS/GRAFTS ANGIOGRAPHY N/A 07/25/2019   Procedure: LEFT HEART CATH AND CORS/GRAFTS ANGIOGRAPHY;  Surgeon: Verlin Lonni BIRCH, MD;  Location: [facility];  Service: Cardiovascular;  Laterality: N/A;   POLYPECTOMY     Family History  Problem Relation Age of Onset   Stroke Father 89   Diabetes Neg Hx    Cancer Neg Hx    GER disease Neg Hx    Heart attack Neg Hx    Colon cancer Neg Hx    Esophageal cancer Neg Hx    Stomach cancer Neg Hx    Heart disease Neg Hx    Social History   Socioeconomic History   Marital status: Widowed    Spouse name: Not on file   Number of children: 2   Years of education: 16   Highest education level: Bachelor's degree (e.g., BA, AB, BS)  Occupational History   Occupation: Retired  Tobacco Use   Smoking status: Former    Current packs/day: 0.00    Average packs/day: 4.0 packs/day for 30.0 years (120.0 ttl pk-yrs)    Types: Cigarettes    Start date: 10/18/1954    Quit date: 10/18/1984    Years since quitting: 39.7   Smokeless tobacco: Former  Building services engineer status: Never Used  Substance and Sexual Activity   Alcohol use: Yes    Alcohol/week: 12.0 standard drinks of alcohol    Types: 7 Glasses of wine, 5 Cans of beer per week   Drug use: No   Sexual activity: Yes  Other Topics Concern   Not on file  Social History Narrative   Lives alone   R handed   Caffeine: 2 C of coffee AM   Social Drivers of Corporate investment banker Strain: Low Risk  (07/23/2024)   Overall Financial Resource Strain (CARDIA)     Difficulty of Paying Living Expenses: Not hard at all  Food Insecurity: No Food Insecurity (07/23/2024)   Hunger Vital Sign    Worried About Running Out of Food in the Last Year: Never true    Ran Out of Food in the Last Year: Never true  Transportation Needs: No Transportation Needs (07/23/2024)   PRAPARE - Administrator, Civil Service (Medical): No    Lack of Transportation (Non-Medical): No  Physical Activity: Sufficiently Active (07/23/2024)   Exercise Vital Sign    Days of Exercise per Week: 5 days    Minutes of Exercise per Session: 30 min  Stress: No Stress Concern  Present (07/23/2024)   [employer] of Occupational Health - Occupational Stress Questionnaire    Feeling of Stress: Not at all  Social Connections: Moderately Integrated (07/23/2024)   Social Connection and Isolation Panel    Frequency of Communication with Friends and Family: More than three times a week    Frequency of Social Gatherings with Friends and Family: More than three times a week    Attends Religious Services: More than 4 times per year    Active Member of [employer] or Organizations: Yes    Attends Banker Meetings: More than 4 times per year    Marital Status: Widowed    Tobacco Counseling Counseling given: Not Answered    Clinical Intake:  Pre-visit preparation completed: Yes  Pain : No/denies pain     BMI - recorded: 24.73 Nutritional Status: BMI of 19-24  Normal Nutritional Risks: None Diabetes: No  Lab Results  Component Value Date   HGBA1C 5.9 (H) 12/18/2020   HGBA1C 5.9 11/16/2019   HGBA1C 5.8 06/17/2016     How often do you need to have someone help you when you read instructions, pamphlets, or other written materials from your doctor or pharmacy?: 1 - Never  Interpreter Needed?: No  Information entered by :: Verdie Saba, CMA   Activities of Daily Living     07/23/2024   10:53 AM  In your present state of health, do you have any difficulty  performing the following activities:  Hearing? 0  Vision? 0  Difficulty concentrating or making decisions? 0  Walking or climbing stairs? 0  Dressing or bathing? 0  Doing errands, shopping? 0  Preparing Food and eating ? N  Using the Toilet? N  In the past six months, have you accidently leaked urine? N  Do you have problems with loss of bowel control? N  Managing your Medications? N  Managing your Finances? N  Housekeeping or managing your Housekeeping? N    Patient Care Team: Geofm Glade PARAS, MD as PCP - General (Internal Medicine) Verlin Lonni BIRCH, MD as PCP - Cardiology (Cardiology) Octavia Charlie Hamilton, MD as Consulting Physician (Ophthalmology)  I have updated your Care Teams any recent Medical Services you may have received from other providers in the past year.     Assessment:   This is a routine wellness examination for Inez.  Hearing/Vision screen Hearing Screening - Comments:: Wears hearing aids Vision Screening - Comments:: Wears rx glasses - up to date with routine eye exams with [facility]   Goals Addressed               This Visit's Progress     Patient Stated (pt-stated)        Patient stated he plans to do more walking        Depression Screen     07/23/2024   10:55 AM 07/21/2023   10:44 AM 03/04/2023    2:18 PM 06/24/2022    3:36 PM 02/27/2021    3:17 PM 03/10/2020   11:56 AM 01/15/2020    3:18 PM  PHQ 2/9 Scores  PHQ - 2 Score 0 0 0 0 0    PHQ- 9 Score 0 0 2      Exception Documentation      Other- indicate reason in comment box Other- indicate reason in comment box  Not completed      Patient is still grieving his wife; he is on treatment. pt just lost his wife a few days  ago, he is grieving    Fall Risk     07/23/2024   10:53 AM 07/21/2023   10:41 AM 03/04/2023    2:18 PM 06/24/2022    3:51 PM 06/24/2022    3:37 PM  Fall Risk   Falls in the past year? 1 0 0 0 0  Number falls in past yr: 0 0 0 0 0  Comment 1      Injury with  Fall? 0 0 0 0 0  Risk for fall due to :  No Fall Risks No Fall Risks No Fall Risks No Fall Risks  Follow up Falls evaluation completed;Falls prevention discussed Falls prevention discussed Falls evaluation completed Falls prevention discussed  Falls prevention discussed      Data saved with a previous flowsheet row definition    MEDICARE RISK AT HOME:  Medicare Risk at Home Any stairs in or around the home?: Yes If so, are there any without handrails?: No Home free of loose throw rugs in walkways, pet beds, electrical cords, etc?: Yes Adequate lighting in your home to reduce risk of falls?: Yes Life alert?: No Use of a cane, walker or w/c?: No Grab bars in the bathroom?: Yes Shower chair or bench in shower?: Yes Elevated toilet seat or a handicapped toilet?: Yes  TIMED UP AND GO:  Was the test performed?  No  Cognitive Function: 6CIT completed    02/24/2018    1:43 PM 06/08/2016   11:30 AM  MMSE - Mini Mental State Exam  Not completed:  --  Orientation to time 5   Orientation to Place 5   Registration 3   Attention/ Calculation 5   Recall 1   Language- name 2 objects 2   Language- repeat 1   Language- follow 3 step command 3   Language- read & follow direction 1   Write a sentence 1   Copy design 1   Total score 28         07/23/2024   10:57 AM 07/21/2023   10:42 AM 06/24/2022    3:37 PM  6CIT Screen  What Year? 0 points 0 points 0 points  What month? 0 points 0 points 0 points  What time? 0 points 0 points 0 points  Count back from 20 0 points 0 points 0 points  Months in reverse 0 points 0 points 0 points  Repeat phrase 0 points 0 points 0 points  Total Score 0 points 0 points 0 points    Immunizations Immunization History  Administered Date(s) Administered   Fluad Quad(high Dose 65+) 07/10/2019, 08/01/2020, 07/22/2021   Fluad Trivalent(High Dose 65+) 07/25/2023   INFLUENZA, HIGH DOSE SEASONAL PF 08/01/2013, 07/20/2017, 07/04/2018   Influenza Split  07/27/2011, 08/03/2012   Influenza Whole 09/07/2007, 07/24/2008, 07/23/2009, 08/07/2010   Influenza,inj,Quad PF,6+ Mos 06/26/2014, 08/04/2015, 08/19/2022   PFIZER(Purple Top)SARS-COV-2 Vaccination 11/09/2019, 12/09/2019, 08/30/2020   PNEUMOCOCCAL CONJUGATE-20 08/19/2022   Pneumococcal Conjugate-13 11/03/2015   Pneumococcal Polysaccharide-23 09/13/2012   Tdap 04/27/2019    Screening Tests Health Maintenance  Topic Date Due   Zoster Vaccines- Shingrix (1 of 2) Never done   Influenza Vaccine  05/18/2024   COVID-19 Vaccine (4 - 2025-26 season) 06/18/2024   Medicare Annual Wellness (AWV)  07/23/2025   DTaP/Tdap/Td (2 - Td or Tdap) 04/26/2029   Pneumococcal Vaccine: 50+ Years  Completed   Meningococcal B Vaccine  Aged Out    Health Maintenance Items Addressed: 07/23/2024  Additional Screening:  Vision Screening: Recommended annual ophthalmology exams  for early detection of glaucoma and other disorders of the eye. Is the patient up to date with their annual eye exam?  Yes  Who is the provider or what is the name of the office in which the patient attends annual eye exams? [facility]  Dental Screening: Recommended annual dental exams for proper oral hygiene  Community Resource Referral / Chronic Care Management: CRR required this visit?  No   CCM required this visit?  No   Plan:    I have personally reviewed and noted the following in the patient's chart:   Medical and social history Use of alcohol, tobacco or illicit drugs  Current medications and supplements including opioid prescriptions. Patient is not currently taking opioid prescriptions. Functional ability and status Nutritional status Physical activity Advanced directives List of other physicians Hospitalizations, surgeries, and ER visits in previous 12 months Vitals Screenings to include cognitive, depression, and falls Referrals and appointments  In addition, I have reviewed and discussed with patient  certain preventive protocols, quality metrics, and best practice recommendations. A written personalized care plan for preventive services as well as general preventive health recommendations were provided to patient.   Verdie CHRISTELLA Saba, CMA   07/23/2024   After Visit Summary: (MyChart) Due to this being a telephonic visit, the after visit summary with patients personalized plan was offered to patient via MyChart   Notes: Scheduled a 1-yr Physical w/PCP for 10/2024

## 2024-08-13 ENCOUNTER — Other Ambulatory Visit: Payer: Self-pay | Admitting: Internal Medicine

## 2024-08-13 NOTE — Telephone Encounter (Unsigned)
 Copied from CRM [id number]. Topic: Clinical - Medication Refill >> Aug 13, 2024 11:40 AM Alexandria E wrote: Medication: sildenafil  (VIAGRA ) 100 MG tablet   Has the patient contacted their pharmacy? No (Agent: If no, request that the patient contact the pharmacy for the refill. If patient does not wish to contact the pharmacy document the reason why and proceed with request.) (Agent: If yes, when and what did the pharmacy advise?)  This is the patient's preferred pharmacy:   [facility] - [place] - [address] [facility] [address] [place] [address] [place] [address] Phone: [phone] Fax: [phone]   Is this the correct pharmacy for this prescription? Yes If no, delete pharmacy and type the correct one.   Has the prescription been filled recently? No  Is the patient out of the medication? No  Has the patient been seen for an appointment in the last year OR does the patient have an upcoming appointment? Yes  Can we respond through MyChart? Yes  Agent: Please be advised that Rx refills may take up to 3 business days. We ask that you follow-up with your pharmacy.

## 2024-08-14 MED ORDER — SILDENAFIL CITRATE 100 MG PO TABS: 50.0000 mg | ORAL_TABLET | Freq: Every day | ORAL | 11 refills | Status: AC | PRN

## 2024-08-15 ENCOUNTER — Ambulatory Visit (INDEPENDENT_AMBULATORY_CARE_PROVIDER_SITE_OTHER)

## 2024-08-15 DIAGNOSIS — Z23 Encounter for immunization: Secondary | ICD-10-CM | POA: Diagnosis not present

## 2024-08-15 NOTE — Progress Notes (Signed)
 After obtaining consent, and per orders of Dr. Geofm, injection of HDFLU given by Ronnald SHAUNNA Palms. Patient instructed to report any adverse reaction to me immediately.

## 2024-08-20 DIAGNOSIS — C44212 Basal cell carcinoma of skin of right ear and external auricular canal: Secondary | ICD-10-CM | POA: Diagnosis not present

## 2024-08-20 DIAGNOSIS — Z85828 Personal history of other malignant neoplasm of skin: Secondary | ICD-10-CM | POA: Diagnosis not present

## 2024-08-20 DIAGNOSIS — D485 Neoplasm of uncertain behavior of skin: Secondary | ICD-10-CM | POA: Diagnosis not present

## 2024-08-20 DIAGNOSIS — L905 Scar conditions and fibrosis of skin: Secondary | ICD-10-CM | POA: Diagnosis not present

## 2024-11-04 ENCOUNTER — Encounter: Payer: Self-pay | Admitting: Internal Medicine

## 2024-11-04 NOTE — Progress Notes (Unsigned)
 "     Subjective:    Patient ID: Fernando Walker, male    DOB: [birth year], 89 y.o.   MRN: [id number]     HPI Fernando Walker is here for follow up of his chronic medical problems.  Feet are still almost numb.  Saw neuro in 2023.  He only has numbness.  He denies pain.  He is taking tai chi classes to help with balance twice a week.  He is now living at [facility].  Likes it.  He misses his wife.  Medications and allergies reviewed with patient and updated if appropriate.  Medications Ordered Prior to Encounter[1]   Review of Systems  Constitutional:  Negative for fever.  Respiratory:  Positive for shortness of breath (with exertion). Negative for cough and wheezing.   Cardiovascular:  Negative for chest pain, palpitations and leg swelling.  Gastrointestinal:  Negative for abdominal pain, constipation and diarrhea.  Neurological:  Negative for light-headedness and headaches.  Psychiatric/Behavioral:  Negative for dysphoric mood. The patient is not nervous/anxious.        Objective:   Vitals:   11/05/24 1302  BP: 102/70  Pulse: (!) 56  Temp: 98 F (36.7 C)  SpO2: 95%   BP Readings from Last 3 Encounters:  11/05/24 102/70  05/24/24 94/63  06/08/23 102/76   Wt Readings from Last 3 Encounters:  11/05/24 187 lb (84.8 kg)  07/23/24 190 lb (86.2 kg)  05/24/24 194 lb 3.2 oz (88.1 kg)   Body mass index is 24.34 kg/m.    Physical Exam Constitutional:      General: He is not in acute distress.    Appearance: Normal appearance. He is not ill-appearing.  HENT:     Head: Normocephalic and atraumatic.  Eyes:     Conjunctiva/sclera: Conjunctivae normal.  Cardiovascular:     Rate and Rhythm: Normal rate and regular rhythm.     Heart sounds: Normal heart sounds.  Pulmonary:     Effort: Pulmonary effort is normal. No respiratory distress.     Breath sounds: Normal breath sounds. No wheezing or rales.  Abdominal:     General: There is no distension.     Palpations: Abdomen  is soft.     Tenderness: There is no abdominal tenderness.  Musculoskeletal:     Right lower leg: No edema.     Left lower leg: No edema.  Skin:    General: Skin is warm and dry.     Findings: No rash.  Neurological:     Mental Status: He is alert. Mental status is at baseline.  Psychiatric:        Mood and Affect: Mood normal.        Lab Results  Component Value Date   WBC 8.3 09/13/2023   HGB 14.4 09/13/2023   HCT 44.9 09/13/2023   PLT 267 09/13/2023   GLUCOSE 109 (H) 09/13/2023   CHOL 129 08/13/2022   TRIG 59 08/13/2022   HDL 62 08/13/2022   LDLCALC 54 08/13/2022   ALT 20 08/13/2022   AST 23 08/13/2022   NA 139 09/13/2023   K 4.1 09/13/2023   CL 103 09/13/2023   CREATININE 1.11 09/13/2023   BUN 10 09/13/2023   CO2 27 09/13/2023   TSH 3.14 11/16/2019   PSA 0.45 04/05/2017   INR 1.0 ratio 04/29/2009   HGBA1C 5.9 (H) 12/18/2020     Assessment & Plan:    See Problem List for Assessment and Plan of chronic medical problems.        [  1]  Current Outpatient Medications on File Prior to Visit  Medication Sig Dispense Refill   ELIQUIS  5 MG TABS tablet TAKE 1 TABLET BY MOUTH TWICE  DAILY 180 tablet 3   latanoprost  (XALATAN ) 0.005 % ophthalmic solution Place 1 drop into both eyes at bedtime.     levalbuterol  (XOPENEX  HFA) 45 MCG/ACT inhaler USE 2 INHALATIONS BY MOUTH IN  THE MORNING AND 2 INHALATIONS BY MOUTH AT NIGHT AS NEEDED FOR  WHEEZING 30 g 3   rosuvastatin  (CRESTOR ) 5 MG tablet Take 1 tablet (5 mg total) by mouth daily. 90 tablet 3   sildenafil  (VIAGRA ) 100 MG tablet Take 0.5-1 tablets (50-100 mg total) by mouth daily as needed for erectile dysfunction. 5 tablet 11   triamcinolone ointment (KENALOG) 0.1 % Apply 1 application topically 2 (two) times daily as needed (rash).     vitamin B-12 (CYANOCOBALAMIN) 1000 MCG tablet Take 1,000 mcg by mouth every evening.     No current facility-administered medications on file prior to visit.   "

## 2024-11-04 NOTE — Patient Instructions (Addendum)
" ° ° ° ° °  Blood work was ordered.       Medications changes include :   samples of breztri  and trelegy - two different inhalers to be used daily.       Return in about 1 year (around 11/05/2025) for follow up.  "

## 2024-11-05 ENCOUNTER — Ambulatory Visit (INDEPENDENT_AMBULATORY_CARE_PROVIDER_SITE_OTHER): Admitting: Internal Medicine

## 2024-11-05 VITALS — BP 102/70 | HR 56 | Temp 98.0°F | Wt 187.0 lb

## 2024-11-05 DIAGNOSIS — I25118 Atherosclerotic heart disease of native coronary artery with other forms of angina pectoris: Secondary | ICD-10-CM

## 2024-11-05 DIAGNOSIS — J449 Chronic obstructive pulmonary disease, unspecified: Secondary | ICD-10-CM

## 2024-11-05 DIAGNOSIS — E7800 Pure hypercholesterolemia, unspecified: Secondary | ICD-10-CM | POA: Diagnosis not present

## 2024-11-05 DIAGNOSIS — I480 Paroxysmal atrial fibrillation: Secondary | ICD-10-CM

## 2024-11-05 DIAGNOSIS — N529 Male erectile dysfunction, unspecified: Secondary | ICD-10-CM | POA: Diagnosis not present

## 2024-11-05 DIAGNOSIS — R7303 Prediabetes: Secondary | ICD-10-CM | POA: Diagnosis not present

## 2024-11-05 LAB — COMPREHENSIVE METABOLIC PANEL WITH GFR
ALT: 16 U/L (ref 3–53)
AST: 18 U/L (ref 5–37)
Albumin: 4.2 g/dL (ref 3.5–5.2)
Alkaline Phosphatase: 35 U/L — ABNORMAL LOW (ref 39–117)
BUN: 12 mg/dL (ref 6–23)
CO2: 30 meq/L (ref 19–32)
Calcium: 9.4 mg/dL (ref 8.4–10.5)
Chloride: 102 meq/L (ref 96–112)
Creatinine, Ser: 1.01 mg/dL (ref 0.40–1.50)
GFR: 65.83 mL/min
Glucose, Bld: 94 mg/dL (ref 70–99)
Potassium: 4.1 meq/L (ref 3.5–5.1)
Sodium: 139 meq/L (ref 135–145)
Total Bilirubin: 1.8 mg/dL — ABNORMAL HIGH (ref 0.2–1.2)
Total Protein: 6.9 g/dL (ref 6.0–8.3)

## 2024-11-05 LAB — LIPID PANEL
Cholesterol: 131 mg/dL (ref 28–200)
HDL: 64.2 mg/dL
LDL Cholesterol: 57 mg/dL (ref 10–99)
NonHDL: 66.38
Total CHOL/HDL Ratio: 2
Triglycerides: 45 mg/dL (ref 10.0–149.0)
VLDL: 9 mg/dL (ref 0.0–40.0)

## 2024-11-05 LAB — CBC
HCT: 43 % (ref 39.0–52.0)
Hemoglobin: 14.3 g/dL (ref 13.0–17.0)
MCHC: 33.2 g/dL (ref 30.0–36.0)
MCV: 90 fl (ref 78.0–100.0)
Platelets: 262 K/uL (ref 150.0–400.0)
RBC: 4.78 Mil/uL (ref 4.22–5.81)
RDW: 14.7 % (ref 11.5–15.5)
WBC: 8.3 K/uL (ref 4.0–10.5)

## 2024-11-05 LAB — HEMOGLOBIN A1C: Hgb A1c MFr Bld: 5.8 % (ref 4.6–6.5)

## 2024-11-05 LAB — TSH: TSH: 2.88 u[IU]/mL (ref 0.35–5.50)

## 2024-11-05 MED ORDER — TRELEGY ELLIPTA 100-62.5-25 MCG/ACT IN AEPB: 1.0000 | INHALATION_SPRAY | Freq: Every day | RESPIRATORY_TRACT | Status: AC

## 2024-11-05 MED ORDER — BREZTRI AEROSPHERE 160-9-4.8 MCG/ACT IN AERO: 2.0000 | INHALATION_SPRAY | Freq: Two times a day (BID) | RESPIRATORY_TRACT | Status: AC

## 2024-11-05 NOTE — Assessment & Plan Note (Signed)
 Chronic-paroxysmal On eliquis  5 mg bid Blood pressure has been well controlled at home  Following with cardiology

## 2024-11-05 NOTE — Assessment & Plan Note (Signed)
Chronic Continue sildenafil 50-100 mg daily as needed 

## 2024-11-05 NOTE — Assessment & Plan Note (Signed)
 Chronic Lab Results  Component Value Date   HGBA1C 5.9 (H) 12/18/2020   Check a1c Low sugar / carb diet Stressed regular exercise

## 2024-11-05 NOTE — Assessment & Plan Note (Addendum)
 Chronic Has DOE Using xopenex  twice a day, continue-if starts using a maintenance inhaler can use use Xopenex  as needed Samples of breztri  and trelegy given to try-discussed that should help with his dyspnea on exertion-stressed rinsing mouth after use which should help prevent pharyngitis symptoms that he had with previous inhaler

## 2024-11-05 NOTE — Assessment & Plan Note (Signed)
 Chronic No chest pain, palpitations Following with cardiology On crestor  5 mg daily

## 2024-11-05 NOTE — Assessment & Plan Note (Signed)
 Chronic Lipid panel, tsh, cmp, cbc Continue simvastatin  40 mg daily Continue to be active

## 2024-11-08 ENCOUNTER — Telehealth: Payer: Self-pay

## 2024-11-08 ENCOUNTER — Ambulatory Visit: Payer: Self-pay | Admitting: Internal Medicine

## 2024-11-08 NOTE — Telephone Encounter (Signed)
 Copied from CRM #[id number]. Topic: Clinical - Lab/Test Results >> Nov 08, 2024  4:21 PM Aisha D wrote: Reason for CRM: Pt is calling in regards to his lab results on 1/19. Pt stated that he seen that the results came back abnormal and wanted to know if Dr.Burns needed to follow up with him before 11/08/25. Pt would like a callback with an update on this concern.

## 2024-11-23 ENCOUNTER — Telehealth: Payer: Self-pay

## 2024-11-23 NOTE — Telephone Encounter (Signed)
 Copied from CRM [id number]. Topic: Clinical - Medication Question >> Nov 23, 2024 10:33 AM Carlyon D wrote: Reason for CRM: Pt is calling asking how to use some of these medications he has   budesonide-glycopyrrolate-formoterol (BREZTRI  AEROSPHERE) 160-9-4.8 MCG/ACT AERO inhaler  And   levalbuterol  (XOPENEX  HFA) 45 MCG/ACT inhaler   Pt is wanting a call back in regards to these he states he does not know if its 1 puff a day or 2 and would like a call back in regards to this please.  Pt will be gone from 11-1 today so please call before or after those times. You can leave a detailed message on the dosage for the Breztri  ?

## 2025-07-24 ENCOUNTER — Ambulatory Visit

## 2025-11-08 ENCOUNTER — Ambulatory Visit: Admitting: Internal Medicine
